# Patient Record
Sex: Female | Born: 1935
Health system: Southern US, Community
[De-identification: ages and names within clinical notes are randomized; demographics above are authoritative.]

## PROBLEM LIST (undated history)

## (undated) DIAGNOSIS — I10 Essential (primary) hypertension: Secondary | ICD-10-CM

## (undated) DIAGNOSIS — E119 Type 2 diabetes mellitus without complications: Secondary | ICD-10-CM

## (undated) DIAGNOSIS — R569 Unspecified convulsions: Secondary | ICD-10-CM

## (undated) DIAGNOSIS — F0390 Unspecified dementia without behavioral disturbance: Secondary | ICD-10-CM

## (undated) DIAGNOSIS — J189 Pneumonia, unspecified organism: Secondary | ICD-10-CM

## (undated) DIAGNOSIS — C50919 Malignant neoplasm of unspecified site of unspecified female breast: Secondary | ICD-10-CM

## (undated) DIAGNOSIS — Z853 Personal history of malignant neoplasm of breast: Secondary | ICD-10-CM

## (undated) DIAGNOSIS — E785 Hyperlipidemia, unspecified: Secondary | ICD-10-CM

## (undated) DIAGNOSIS — Z923 Personal history of irradiation: Secondary | ICD-10-CM

## (undated) HISTORY — DX: Unspecified dementia, unspecified severity, without behavioral disturbance, psychotic disturbance, mood disturbance, and anxiety: F03.90

## (undated) HISTORY — DX: Unspecified convulsions: R56.9

## (undated) HISTORY — DX: Essential (primary) hypertension: I10

## (undated) HISTORY — PX: BREAST BIOPSY: SHX20

## (undated) HISTORY — PX: ABDOMINAL HYSTERECTOMY: SHX81

## (undated) HISTORY — DX: Pneumonia, unspecified organism: J18.9

## (undated) HISTORY — DX: Personal history of malignant neoplasm of breast: Z85.3

## (undated) HISTORY — DX: Hyperlipidemia, unspecified: E78.5

---

## 1979-11-03 HISTORY — PX: TOTAL ABDOMINAL HYSTERECTOMY W/ BILATERAL SALPINGOOPHORECTOMY: SHX83

## 2003-09-28 LAB — HM MAMMOGRAPHY

## 2003-11-03 DIAGNOSIS — C50919 Malignant neoplasm of unspecified site of unspecified female breast: Secondary | ICD-10-CM

## 2003-11-03 HISTORY — DX: Malignant neoplasm of unspecified site of unspecified female breast: C50.919

## 2003-11-03 HISTORY — PX: BREAST EXCISIONAL BIOPSY: SUR124

## 2003-11-03 HISTORY — PX: BREAST LUMPECTOMY: SHX2

## 2003-11-23 ENCOUNTER — Other Ambulatory Visit: Payer: Self-pay

## 2004-08-18 ENCOUNTER — Ambulatory Visit: Payer: Self-pay | Admitting: Oncology

## 2004-10-10 ENCOUNTER — Ambulatory Visit: Payer: Self-pay | Admitting: General Surgery

## 2004-11-24 ENCOUNTER — Ambulatory Visit: Payer: Self-pay | Admitting: General Surgery

## 2004-11-24 HISTORY — PX: OTHER SURGICAL HISTORY: SHX169

## 2004-11-24 LAB — HM COLONOSCOPY

## 2004-12-24 ENCOUNTER — Ambulatory Visit: Payer: Self-pay | Admitting: Oncology

## 2004-12-31 ENCOUNTER — Ambulatory Visit: Payer: Self-pay | Admitting: Oncology

## 2005-02-16 ENCOUNTER — Ambulatory Visit: Payer: Self-pay | Admitting: Oncology

## 2005-04-08 ENCOUNTER — Ambulatory Visit: Payer: Self-pay | Admitting: General Surgery

## 2005-06-24 ENCOUNTER — Ambulatory Visit: Payer: Self-pay | Admitting: Oncology

## 2005-07-03 ENCOUNTER — Ambulatory Visit: Payer: Self-pay | Admitting: Oncology

## 2005-08-19 ENCOUNTER — Ambulatory Visit: Payer: Self-pay | Admitting: Oncology

## 2005-10-09 ENCOUNTER — Ambulatory Visit: Payer: Self-pay | Admitting: General Surgery

## 2005-12-21 ENCOUNTER — Ambulatory Visit: Payer: Self-pay | Admitting: Oncology

## 2005-12-31 ENCOUNTER — Ambulatory Visit: Payer: Self-pay | Admitting: Oncology

## 2006-04-07 ENCOUNTER — Ambulatory Visit: Payer: Self-pay | Admitting: General Surgery

## 2006-06-03 ENCOUNTER — Ambulatory Visit: Payer: Self-pay | Admitting: Family Medicine

## 2006-06-11 ENCOUNTER — Ambulatory Visit: Payer: Self-pay | Admitting: Oncology

## 2006-07-03 ENCOUNTER — Ambulatory Visit: Payer: Self-pay | Admitting: Oncology

## 2006-08-25 ENCOUNTER — Ambulatory Visit: Payer: Self-pay | Admitting: Radiation Oncology

## 2006-10-04 ENCOUNTER — Ambulatory Visit: Payer: Self-pay | Admitting: General Surgery

## 2006-12-07 ENCOUNTER — Ambulatory Visit: Payer: Self-pay | Admitting: Oncology

## 2007-01-01 ENCOUNTER — Ambulatory Visit: Payer: Self-pay | Admitting: Oncology

## 2007-04-11 ENCOUNTER — Ambulatory Visit: Payer: Self-pay | Admitting: General Surgery

## 2007-05-03 ENCOUNTER — Ambulatory Visit: Payer: Self-pay | Admitting: Oncology

## 2007-05-30 ENCOUNTER — Ambulatory Visit: Payer: Self-pay | Admitting: Oncology

## 2007-06-03 ENCOUNTER — Ambulatory Visit: Payer: Self-pay | Admitting: Oncology

## 2007-08-31 ENCOUNTER — Ambulatory Visit: Payer: Self-pay | Admitting: Radiation Oncology

## 2007-09-03 ENCOUNTER — Ambulatory Visit: Payer: Self-pay | Admitting: Radiation Oncology

## 2007-10-10 ENCOUNTER — Ambulatory Visit: Payer: Self-pay | Admitting: General Surgery

## 2007-12-04 ENCOUNTER — Ambulatory Visit: Payer: Self-pay | Admitting: Oncology

## 2007-12-07 ENCOUNTER — Ambulatory Visit: Payer: Self-pay | Admitting: Oncology

## 2008-01-01 ENCOUNTER — Ambulatory Visit: Payer: Self-pay | Admitting: Oncology

## 2008-06-02 ENCOUNTER — Ambulatory Visit: Payer: Self-pay | Admitting: Oncology

## 2008-06-06 ENCOUNTER — Ambulatory Visit: Payer: Self-pay | Admitting: Oncology

## 2008-07-03 ENCOUNTER — Ambulatory Visit: Payer: Self-pay | Admitting: Oncology

## 2008-08-02 ENCOUNTER — Ambulatory Visit: Payer: Self-pay | Admitting: Oncology

## 2008-08-30 ENCOUNTER — Ambulatory Visit: Payer: Self-pay | Admitting: Radiation Oncology

## 2008-10-08 ENCOUNTER — Ambulatory Visit: Payer: Self-pay | Admitting: General Surgery

## 2008-12-03 ENCOUNTER — Ambulatory Visit: Payer: Self-pay | Admitting: Oncology

## 2008-12-06 ENCOUNTER — Ambulatory Visit: Payer: Self-pay | Admitting: Oncology

## 2008-12-31 ENCOUNTER — Ambulatory Visit: Payer: Self-pay | Admitting: Oncology

## 2009-06-02 ENCOUNTER — Ambulatory Visit: Payer: Self-pay | Admitting: Oncology

## 2009-06-06 ENCOUNTER — Ambulatory Visit: Payer: Self-pay | Admitting: Oncology

## 2009-07-03 ENCOUNTER — Ambulatory Visit: Payer: Self-pay | Admitting: Oncology

## 2009-10-14 ENCOUNTER — Ambulatory Visit: Payer: Self-pay | Admitting: General Surgery

## 2009-10-16 ENCOUNTER — Ambulatory Visit: Payer: Self-pay | Admitting: General Surgery

## 2009-12-03 ENCOUNTER — Ambulatory Visit: Payer: Self-pay | Admitting: Oncology

## 2009-12-05 ENCOUNTER — Ambulatory Visit: Payer: Self-pay | Admitting: Oncology

## 2009-12-31 ENCOUNTER — Ambulatory Visit: Payer: Self-pay | Admitting: Oncology

## 2010-04-02 LAB — HM DEXA SCAN: HM DEXA SCAN: NORMAL

## 2010-05-06 ENCOUNTER — Ambulatory Visit: Payer: Self-pay | Admitting: General Surgery

## 2010-06-09 ENCOUNTER — Ambulatory Visit: Payer: Self-pay | Admitting: Oncology

## 2010-07-03 ENCOUNTER — Ambulatory Visit: Payer: Self-pay | Admitting: Oncology

## 2010-10-15 ENCOUNTER — Ambulatory Visit: Payer: Self-pay | Admitting: General Surgery

## 2010-10-28 LAB — EKG

## 2010-11-17 DIAGNOSIS — E559 Vitamin D deficiency, unspecified: Secondary | ICD-10-CM | POA: Insufficient documentation

## 2011-01-02 ENCOUNTER — Ambulatory Visit: Payer: Self-pay | Admitting: Oncology

## 2011-02-01 ENCOUNTER — Ambulatory Visit: Payer: Self-pay | Admitting: Oncology

## 2011-04-22 ENCOUNTER — Ambulatory Visit: Payer: Self-pay | Admitting: General Surgery

## 2011-07-21 ENCOUNTER — Ambulatory Visit: Payer: Self-pay | Admitting: Oncology

## 2011-08-03 ENCOUNTER — Ambulatory Visit: Payer: Self-pay | Admitting: Oncology

## 2011-10-19 ENCOUNTER — Ambulatory Visit: Payer: Self-pay | Admitting: General Surgery

## 2012-01-19 ENCOUNTER — Ambulatory Visit: Payer: Self-pay | Admitting: Oncology

## 2012-01-19 LAB — CBC CANCER CENTER
Basophil #: 0 x10 3/mm (ref 0.0–0.1)
Eosinophil %: 2.5 %
HCT: 31.9 % — ABNORMAL LOW (ref 35.0–47.0)
Lymphocyte #: 1.9 x10 3/mm (ref 1.0–3.6)
Lymphocyte %: 27.7 %
MCH: 31.9 pg (ref 26.0–34.0)
MCHC: 33.6 g/dL (ref 32.0–36.0)
MCV: 95 fL (ref 80–100)
Monocyte #: 0.6 x10 3/mm (ref 0.0–0.7)
Monocyte %: 9.1 %
Neutrophil %: 60.2 %
Platelet: 228 x10 3/mm (ref 150–440)
RBC: 3.36 10*6/uL — ABNORMAL LOW (ref 3.80–5.20)
WBC: 6.8 x10 3/mm (ref 3.6–11.0)

## 2012-01-19 LAB — COMPREHENSIVE METABOLIC PANEL
Albumin: 3.7 g/dL (ref 3.4–5.0)
Anion Gap: 9 (ref 7–16)
Calcium, Total: 9.8 mg/dL (ref 8.5–10.1)
Chloride: 103 mmol/L (ref 98–107)
EGFR (African American): 45 — ABNORMAL LOW
EGFR (Non-African Amer.): 37 — ABNORMAL LOW
Glucose: 172 mg/dL — ABNORMAL HIGH (ref 65–99)
Potassium: 4.1 mmol/L (ref 3.5–5.1)
SGOT(AST): 17 U/L (ref 15–37)

## 2012-02-01 ENCOUNTER — Ambulatory Visit: Payer: Self-pay | Admitting: Oncology

## 2012-04-07 LAB — HM DIABETES EYE EXAM

## 2012-10-20 ENCOUNTER — Ambulatory Visit: Payer: Self-pay | Admitting: Oncology

## 2012-12-31 ENCOUNTER — Ambulatory Visit: Payer: Self-pay | Admitting: Oncology

## 2013-01-18 LAB — COMPREHENSIVE METABOLIC PANEL
Alkaline Phosphatase: 64 U/L (ref 50–136)
Anion Gap: 8 (ref 7–16)
BUN: 22 mg/dL — ABNORMAL HIGH (ref 7–18)
Bilirubin,Total: 0.2 mg/dL (ref 0.2–1.0)
Calcium, Total: 9.5 mg/dL (ref 8.5–10.1)
Chloride: 101 mmol/L (ref 98–107)
Creatinine: 1.5 mg/dL — ABNORMAL HIGH (ref 0.60–1.30)
EGFR (African American): 39 — ABNORMAL LOW
Glucose: 218 mg/dL — ABNORMAL HIGH (ref 65–99)
Osmolality: 284 (ref 275–301)
SGOT(AST): 18 U/L (ref 15–37)
SGPT (ALT): 26 U/L (ref 12–78)
Sodium: 137 mmol/L (ref 136–145)
Total Protein: 7.9 g/dL (ref 6.4–8.2)

## 2013-01-18 LAB — CBC CANCER CENTER
Basophil #: 0.1 x10 3/mm (ref 0.0–0.1)
Eosinophil %: 2.7 %
HCT: 31.7 % — ABNORMAL LOW (ref 35.0–47.0)
HGB: 10.6 g/dL — ABNORMAL LOW (ref 12.0–16.0)
Lymphocyte #: 1.6 x10 3/mm (ref 1.0–3.6)
Neutrophil #: 4 x10 3/mm (ref 1.4–6.5)
Neutrophil %: 61.4 %
Platelet: 204 x10 3/mm (ref 150–440)
RBC: 3.34 10*6/uL — ABNORMAL LOW (ref 3.80–5.20)
RDW: 15 % — ABNORMAL HIGH (ref 11.5–14.5)
WBC: 6.5 x10 3/mm (ref 3.6–11.0)

## 2013-01-31 ENCOUNTER — Ambulatory Visit: Payer: Self-pay | Admitting: Oncology

## 2013-04-07 LAB — HM DIABETES EYE EXAM

## 2013-04-11 LAB — HEMOGLOBIN A1C, FINGERSTICK: HEMOGLOBIN A1C: 7.3

## 2013-04-12 LAB — LIPID PANEL
ALT: 20
BUN: 21
CHOLESTEROL, TOTAL: 128
Creat: 1.32
GLUCOSE: 109
HDL: 40 mg/dL (ref 35–70)
LDL CALC: 66 mg/dL
Potassium: 4.7 mmol/L
Sodium: 142
Total CK: 113 U/L (ref ?–170.0)
Triglycerides: 111
Vitamin D 1, 25 (OH)2 Total: 58.8

## 2013-06-22 ENCOUNTER — Emergency Department: Payer: Self-pay | Admitting: Emergency Medicine

## 2013-09-08 LAB — LIPID PANEL
BUN: 20
CREATININE: 1.35
Cholesterol, Total: 198
GLUCOSE: 139
HDL: 42 mg/dL (ref 35–70)
Hemoglobin A1C: 7.5
LDL CALC: 130 mg/dL
Potassium: 4.3 mmol/L
Sodium: 139
Triglycerides: 129

## 2013-10-23 ENCOUNTER — Ambulatory Visit: Payer: Self-pay | Admitting: Oncology

## 2013-12-13 LAB — HEMOGLOBIN A1C, FINGERSTICK
Hemoglobin A1C: 7
Microalb, Ur: 20

## 2014-01-24 ENCOUNTER — Ambulatory Visit: Payer: Self-pay | Admitting: Oncology

## 2014-01-25 LAB — CBC CANCER CENTER
Basophil #: 0.1 x10 3/mm (ref 0.0–0.1)
Basophil %: 1 %
EOS ABS: 0.2 x10 3/mm (ref 0.0–0.7)
EOS PCT: 2.6 %
HCT: 35.2 % (ref 35.0–47.0)
HGB: 11.3 g/dL — ABNORMAL LOW (ref 12.0–16.0)
LYMPHS PCT: 20.3 %
Lymphocyte #: 1.5 x10 3/mm (ref 1.0–3.6)
MCH: 31.6 pg (ref 26.0–34.0)
MCHC: 32.3 g/dL (ref 32.0–36.0)
MCV: 98 fL (ref 80–100)
MONOS PCT: 10 %
Monocyte #: 0.8 x10 3/mm (ref 0.2–0.9)
Neutrophil #: 5 x10 3/mm (ref 1.4–6.5)
Neutrophil %: 66.1 %
Platelet: 202 x10 3/mm (ref 150–440)
RBC: 3.59 10*6/uL — ABNORMAL LOW (ref 3.80–5.20)
RDW: 14.1 % (ref 11.5–14.5)
WBC: 7.6 x10 3/mm (ref 3.6–11.0)

## 2014-01-25 LAB — COMPREHENSIVE METABOLIC PANEL
Albumin: 3.6 g/dL (ref 3.4–5.0)
Alkaline Phosphatase: 60 U/L
Anion Gap: 12 (ref 7–16)
BILIRUBIN TOTAL: 0.3 mg/dL (ref 0.2–1.0)
BUN: 26 mg/dL — AB (ref 7–18)
CHLORIDE: 100 mmol/L (ref 98–107)
CO2: 24 mmol/L (ref 21–32)
Calcium, Total: 10 mg/dL (ref 8.5–10.1)
Creatinine: 1.64 mg/dL — ABNORMAL HIGH (ref 0.60–1.30)
EGFR (African American): 34 — ABNORMAL LOW
GFR CALC NON AF AMER: 30 — AB
Glucose: 229 mg/dL — ABNORMAL HIGH (ref 65–99)
Osmolality: 284 (ref 275–301)
Potassium: 3.9 mmol/L (ref 3.5–5.1)
SGOT(AST): 22 U/L (ref 15–37)
SGPT (ALT): 26 U/L (ref 12–78)
SODIUM: 136 mmol/L (ref 136–145)
Total Protein: 8.3 g/dL — ABNORMAL HIGH (ref 6.4–8.2)

## 2014-01-31 ENCOUNTER — Ambulatory Visit: Payer: Self-pay | Admitting: Oncology

## 2014-04-11 LAB — HEMOGLOBIN A1C, FINGERSTICK: Hemoglobin A1C: 7.2

## 2014-09-14 LAB — LIPID PANEL
BUN: 20
CHOLESTEROL, TOTAL: 187
Creat: 1.33
GLUCOSE: 111
HDL: 42 mg/dL (ref 35–70)
HEMOGLOBIN A1C: 8.1
LDL Cholesterol: 119 mg/dL
Potassium: 4.4 mmol/L
Sodium: 139
Triglycerides: 132
Vitamin D 1, 25 (OH)2 Total: 61.1

## 2014-10-24 ENCOUNTER — Ambulatory Visit: Payer: Self-pay | Admitting: Family Medicine

## 2015-01-10 DIAGNOSIS — E119 Type 2 diabetes mellitus without complications: Secondary | ICD-10-CM | POA: Diagnosis not present

## 2015-01-10 DIAGNOSIS — E785 Hyperlipidemia, unspecified: Secondary | ICD-10-CM | POA: Diagnosis not present

## 2015-01-10 DIAGNOSIS — I10 Essential (primary) hypertension: Secondary | ICD-10-CM | POA: Diagnosis not present

## 2015-01-10 DIAGNOSIS — F0390 Unspecified dementia without behavioral disturbance: Secondary | ICD-10-CM | POA: Diagnosis not present

## 2015-01-10 LAB — HEMOGLOBIN A1C, FINGERSTICK
Hemoglobin A1C: 6.5
Microalb, Ur: 20

## 2015-02-15 DIAGNOSIS — F0390 Unspecified dementia without behavioral disturbance: Secondary | ICD-10-CM | POA: Diagnosis not present

## 2015-02-15 DIAGNOSIS — I10 Essential (primary) hypertension: Secondary | ICD-10-CM | POA: Diagnosis not present

## 2015-02-15 DIAGNOSIS — R630 Anorexia: Secondary | ICD-10-CM | POA: Diagnosis not present

## 2015-02-15 DIAGNOSIS — E785 Hyperlipidemia, unspecified: Secondary | ICD-10-CM | POA: Diagnosis not present

## 2015-03-14 DIAGNOSIS — E1165 Type 2 diabetes mellitus with hyperglycemia: Secondary | ICD-10-CM | POA: Diagnosis not present

## 2015-03-14 DIAGNOSIS — F0390 Unspecified dementia without behavioral disturbance: Secondary | ICD-10-CM | POA: Diagnosis not present

## 2015-03-14 DIAGNOSIS — R634 Abnormal weight loss: Secondary | ICD-10-CM | POA: Diagnosis not present

## 2015-03-14 DIAGNOSIS — E785 Hyperlipidemia, unspecified: Secondary | ICD-10-CM | POA: Diagnosis not present

## 2015-03-14 DIAGNOSIS — R635 Abnormal weight gain: Secondary | ICD-10-CM | POA: Diagnosis not present

## 2015-03-14 LAB — CBC AND DIFFERENTIAL
HEMATOCRIT: 33 % — AB (ref 36–46)
Hemoglobin: 10.8 g/dL — AB (ref 12.0–16.0)
Platelets: 242 10*3/uL (ref 150–399)
WBC: 6.9 10^3/mL

## 2015-03-14 LAB — HEPATIC FUNCTION PANEL
ALT: 15 U/L (ref 7–35)
AST: 15 U/L (ref 13–35)

## 2015-03-14 LAB — BASIC METABOLIC PANEL
BUN: 23 mg/dL — AB (ref 4–21)
Creatinine: 1.2 mg/dL — AB (ref 0.5–1.1)
Glucose: 155 mg/dL
Potassium: 4.3 mmol/L (ref 3.4–5.3)
SODIUM: 137 mmol/L (ref 137–147)

## 2015-03-14 LAB — TSH: TSH: 1.14 u[IU]/mL (ref 0.41–5.90)

## 2015-03-15 ENCOUNTER — Other Ambulatory Visit: Payer: Self-pay | Admitting: Family Medicine

## 2015-03-15 DIAGNOSIS — R634 Abnormal weight loss: Secondary | ICD-10-CM

## 2015-03-20 ENCOUNTER — Ambulatory Visit
Admission: RE | Admit: 2015-03-20 | Discharge: 2015-03-20 | Disposition: A | Discharge status: Home / Self Care | Payer: Commercial Managed Care - HMO | Source: Ambulatory Visit | Attending: Family Medicine | Admitting: Family Medicine

## 2015-03-20 DIAGNOSIS — R634 Abnormal weight loss: Secondary | ICD-10-CM | POA: Diagnosis not present

## 2015-03-25 ENCOUNTER — Encounter: Payer: Self-pay | Admitting: *Deleted

## 2015-03-25 DIAGNOSIS — M79604 Pain in right leg: Secondary | ICD-10-CM | POA: Insufficient documentation

## 2015-03-25 DIAGNOSIS — R5381 Other malaise: Secondary | ICD-10-CM | POA: Insufficient documentation

## 2015-03-25 DIAGNOSIS — S60212A Contusion of left wrist, initial encounter: Secondary | ICD-10-CM | POA: Insufficient documentation

## 2015-03-25 DIAGNOSIS — R630 Anorexia: Secondary | ICD-10-CM | POA: Insufficient documentation

## 2015-03-25 DIAGNOSIS — D649 Anemia, unspecified: Secondary | ICD-10-CM | POA: Insufficient documentation

## 2015-03-25 DIAGNOSIS — E785 Hyperlipidemia, unspecified: Secondary | ICD-10-CM | POA: Insufficient documentation

## 2015-03-25 DIAGNOSIS — R634 Abnormal weight loss: Secondary | ICD-10-CM | POA: Insufficient documentation

## 2015-03-25 DIAGNOSIS — E1121 Type 2 diabetes mellitus with diabetic nephropathy: Secondary | ICD-10-CM | POA: Insufficient documentation

## 2015-03-25 DIAGNOSIS — Z853 Personal history of malignant neoplasm of breast: Secondary | ICD-10-CM | POA: Insufficient documentation

## 2015-03-25 DIAGNOSIS — S2002XA Contusion of left breast, initial encounter: Secondary | ICD-10-CM | POA: Insufficient documentation

## 2015-04-17 ENCOUNTER — Encounter: Payer: Self-pay | Admitting: Family Medicine

## 2015-04-17 ENCOUNTER — Ambulatory Visit (INDEPENDENT_AMBULATORY_CARE_PROVIDER_SITE_OTHER): Payer: Commercial Managed Care - HMO | Admitting: Family Medicine

## 2015-04-17 VITALS — BP 114/62 | HR 65 | Temp 98.1°F | Resp 16 | Wt 163.0 lb

## 2015-04-17 DIAGNOSIS — E119 Type 2 diabetes mellitus without complications: Secondary | ICD-10-CM

## 2015-04-17 LAB — POCT GLYCOSYLATED HEMOGLOBIN (HGB A1C)
Est. average glucose Bld gHb Est-mCnc: 137
HEMOGLOBIN A1C: 6.4

## 2015-04-17 NOTE — Progress Notes (Signed)
Patient: Briana Austin Female    DOB: November 13, 1933   81 y.o.   MRN: [id number] Visit Date: 04/17/2015  Today's Provider: Lelon Huh, MD   Chief Complaint  Patient presents with  . Diabetes    follow up   . Hypertension    follow up   Subjective:    HPI  Hypertension, follow-up:  BP Readings from Last 3 Encounters:  04/17/15 114/62  03/14/15 108/60    She was last seen for hypertension 3 months ago.  BP at that visit was 108/60. Management changes since that visit include  none. She reports good compliance with treatment. She is not having side effects.  She is not exercising. She is not adherent to low salt diet.   Outside blood pressures are  Not being checked. She is experiencing none.  Patient denies chest pain, chest pressure/discomfort, claudication, dyspnea, exertional chest pressure/discomfort, fatigue, irregular heart beat, lower extremity edema, near-syncope, orthopnea, palpitations, paroxysmal nocturnal dyspnea, syncope and tachypnea.   Cardiovascular risk factors include advanced age (older than 56 for men, 64 for women).  Use of agents associated with hypertension: none.    Wt Readings from Last 3 Encounters:  04/17/15 163 lb (73.936 kg)  03/14/15 162 lb (73.483 kg)    Weight trend: stable BP Readings from Last 3 Encounters:  04/17/15 114/62  03/14/15 108/60   Current diet: in general, a "healthy" diet    ------------------------------------------------------------------------   Diabetes Mellitus Type II, Follow-up:   Last HgbA1C: 01/10/2015  6.5 Last seen for diabetes 1 months ago.  Management changes included decreasing Metformin to 1 pill daily due to poor appetite. . She reports good compliance with treatment. She is not having side effects.  Current symptoms include none and have been stable. Home blood sugar records: not being checked  Episodes of hypoglycemia? no   Current Insulin Regimen: none Most Recent Eye Exam: < 1  year Weight trend: stable Prior visit with dietician: no Current diet: in general, a "healthy" diet   Current exercise: none  Pertinent Labs:    Component Value Date/Time   CHOL 187 09/14/2014   TRIG 132 09/14/2014   CREATININE 1.2* 03/14/2015   CREATININE 1.33 09/14/2014   CREATININE 1.64* 01/25/2014 1040    Wt Readings from Last 3 Encounters:  04/17/15 163 lb (73.936 kg)  03/14/15 162 lb (73.483 kg)    We had increased her metformin to 2 daily when her A1c was 8.3 in November, but she starting eating poorly and loosing weight after dose increase. Since cutting back to one a day last month, the family reports she has been eating and feeling much better, and is back to her baseline.  ------------------------------------------------------------------------     Previous Medications   ASPIRIN 81 MG TABLET    Take 1 tablet by mouth daily.   CALCIUM CARBONATE (OS-CAL) 600 MG TABS TABLET    Take 1 tablet by mouth daily.   DONEPEZIL (ARICEPT) 5 MG TABLET    Take 1 tablet by mouth daily.   GLIPIZIDE (GLUCOTROL XL) 5 MG 24 HR TABLET    Take 1 tablet by mouth daily.   LISINOPRIL (PRINIVIL,ZESTRIL) 40 MG TABLET    Take 1 tablet by mouth daily.   MEMANTINE (NAMENDA XR) 28 MG CP24 24 HR CAPSULE    Take 1 capsule by mouth daily.   METFORMIN (GLUCOPHAGE-XR) 500 MG 24 HR TABLET    Take 1 tablet by mouth daily.   METOPROLOL-HYDROCHLOROTHIAZIDE (LOPRESSOR HCT) 100-50 MG PER TABLET  Take 1 tablet by mouth daily.   MIRTAZAPINE (REMERON) 7.5 MG TABLET    Take 1 tablet by mouth at bedtime.   POTASSIUM CHLORIDE (K-DUR,KLOR-CON) 10 MEQ TABLET    Take 1 tablet by mouth daily.   PRAVASTATIN (PRAVACHOL) 40 MG TABLET    Take 1 tablet by mouth daily.   VITAMIN D, ERGOCALCIFEROL, (DRISDOL) 50000 UNITS CAPS CAPSULE    Take 1 capsule by mouth once a week.    Review of Systems  Constitutional: Positive for appetite change. Negative for fever, chills, fatigue and unexpected weight change.  HENT: Negative  for congestion.   Respiratory: Negative for chest tightness.   Cardiovascular: Negative for chest pain and leg swelling.  Endocrine: Negative for cold intolerance.    History  Substance Use Topics  . Smoking status: Former Smoker -- 0.50 packs/day for 8 years    Types: Cigarettes  . Smokeless tobacco: Not on file  . Alcohol Use: No   Objective:   BP 114/62 mmHg  Pulse 65  Temp(Src) 98.1 F (36.7 C) (Oral)  Resp 16  Wt 163 lb (73.936 kg)  SpO2 98%  Physical Exam   General Appearance:    Alert, cooperative, no distress.   Eyes:    PERRL, conjunctiva/corneas clear, EOM's intact       Lungs:     Clear to auscultation bilaterally, respirations unlabored  Heart:    Regular rate and rhythm  Neurologic:   Awake, alert, oriented only to person. No apparent focal   neurological           defect.        Results for orders placed or performed in visit on 04/17/15  POCT HgB A1C  Result Value Ref Range   Hemoglobin A1C 6.4    Est. average glucose Bld gHb Est-mCnc 137        Assessment & Plan:      1. Type 2 diabetes mellitus without complication Well controlled, feeling better with improved appetite since cutting back on metformin. Recheck in the fall. May need different medication if A1c gets back up above 8.  - POCT HgB A1C   Follow up: Return in about 4 months (around 08/17/2015).

## 2015-05-03 ENCOUNTER — Other Ambulatory Visit: Payer: Self-pay

## 2015-05-03 DIAGNOSIS — E785 Hyperlipidemia, unspecified: Secondary | ICD-10-CM

## 2015-05-03 DIAGNOSIS — I10 Essential (primary) hypertension: Secondary | ICD-10-CM

## 2015-05-03 MED ORDER — PRAVASTATIN SODIUM 40 MG PO TABS: 40.0000 mg | ORAL_TABLET | Freq: Every day | ORAL | Status: DC

## 2015-05-03 MED ORDER — LISINOPRIL 40 MG PO TABS: 40.0000 mg | ORAL_TABLET | Freq: Every day | ORAL | Status: DC

## 2015-05-03 NOTE — Telephone Encounter (Signed)
Pharmacy: [facility] [place]  Request refill. Last office visit was 04/17/2015

## 2015-06-03 ENCOUNTER — Other Ambulatory Visit: Payer: Self-pay | Admitting: Family Medicine

## 2015-06-03 NOTE — Telephone Encounter (Signed)
Next OV is on 06/18/2015

## 2015-06-04 DIAGNOSIS — E119 Type 2 diabetes mellitus without complications: Secondary | ICD-10-CM | POA: Diagnosis not present

## 2015-06-04 LAB — HM DIABETES EYE EXAM

## 2015-06-05 ENCOUNTER — Encounter: Payer: Self-pay | Admitting: Family Medicine

## 2015-06-07 ENCOUNTER — Encounter: Payer: Self-pay | Admitting: Family Medicine

## 2015-06-07 ENCOUNTER — Ambulatory Visit
Admission: RE | Admit: 2015-06-07 | Discharge: 2015-06-07 | Disposition: A | Discharge status: Home / Self Care | Payer: Commercial Managed Care - HMO | Source: Ambulatory Visit | Attending: Family Medicine | Admitting: Family Medicine

## 2015-06-07 ENCOUNTER — Ambulatory Visit (INDEPENDENT_AMBULATORY_CARE_PROVIDER_SITE_OTHER): Payer: Commercial Managed Care - HMO | Admitting: Family Medicine

## 2015-06-07 VITALS — BP 112/58 | HR 92 | Temp 101.0°F | Resp 14 | Ht 68.0 in | Wt 162.0 lb

## 2015-06-07 DIAGNOSIS — I10 Essential (primary) hypertension: Secondary | ICD-10-CM | POA: Diagnosis not present

## 2015-06-07 DIAGNOSIS — G510 Bell's palsy: Secondary | ICD-10-CM | POA: Diagnosis not present

## 2015-06-07 DIAGNOSIS — Z90710 Acquired absence of both cervix and uterus: Secondary | ICD-10-CM | POA: Diagnosis not present

## 2015-06-07 DIAGNOSIS — Z87891 Personal history of nicotine dependence: Secondary | ICD-10-CM | POA: Diagnosis not present

## 2015-06-07 DIAGNOSIS — Z820 Family history of epilepsy and other diseases of the nervous system: Secondary | ICD-10-CM | POA: Diagnosis not present

## 2015-06-07 DIAGNOSIS — K5720 Diverticulitis of large intestine with perforation and abscess without bleeding: Secondary | ICD-10-CM | POA: Diagnosis not present

## 2015-06-07 DIAGNOSIS — G610 Guillain-Barre syndrome: Secondary | ICD-10-CM | POA: Diagnosis not present

## 2015-06-07 DIAGNOSIS — E1165 Type 2 diabetes mellitus with hyperglycemia: Secondary | ICD-10-CM | POA: Diagnosis not present

## 2015-06-07 DIAGNOSIS — K6389 Other specified diseases of intestine: Secondary | ICD-10-CM | POA: Diagnosis not present

## 2015-06-07 DIAGNOSIS — E119 Type 2 diabetes mellitus without complications: Secondary | ICD-10-CM | POA: Diagnosis not present

## 2015-06-07 DIAGNOSIS — Z853 Personal history of malignant neoplasm of breast: Secondary | ICD-10-CM | POA: Diagnosis not present

## 2015-06-07 DIAGNOSIS — E785 Hyperlipidemia, unspecified: Secondary | ICD-10-CM | POA: Diagnosis not present

## 2015-06-07 DIAGNOSIS — I82401 Acute embolism and thrombosis of unspecified deep veins of right lower extremity: Secondary | ICD-10-CM | POA: Diagnosis not present

## 2015-06-07 DIAGNOSIS — R05 Cough: Secondary | ICD-10-CM | POA: Insufficient documentation

## 2015-06-07 DIAGNOSIS — R5383 Other fatigue: Secondary | ICD-10-CM

## 2015-06-07 DIAGNOSIS — R51 Headache: Secondary | ICD-10-CM | POA: Diagnosis present

## 2015-06-07 DIAGNOSIS — Z7982 Long term (current) use of aspirin: Secondary | ICD-10-CM | POA: Diagnosis not present

## 2015-06-07 DIAGNOSIS — Z9889 Other specified postprocedural states: Secondary | ICD-10-CM | POA: Diagnosis not present

## 2015-06-07 DIAGNOSIS — K5732 Diverticulitis of large intestine without perforation or abscess without bleeding: Secondary | ICD-10-CM | POA: Diagnosis not present

## 2015-06-07 DIAGNOSIS — R509 Fever, unspecified: Secondary | ICD-10-CM | POA: Diagnosis not present

## 2015-06-07 DIAGNOSIS — R634 Abnormal weight loss: Secondary | ICD-10-CM | POA: Diagnosis not present

## 2015-06-07 DIAGNOSIS — Z79899 Other long term (current) drug therapy: Secondary | ICD-10-CM | POA: Diagnosis not present

## 2015-06-07 DIAGNOSIS — Z823 Family history of stroke: Secondary | ICD-10-CM | POA: Diagnosis not present

## 2015-06-07 DIAGNOSIS — J189 Pneumonia, unspecified organism: Secondary | ICD-10-CM | POA: Diagnosis not present

## 2015-06-07 DIAGNOSIS — F0390 Unspecified dementia without behavioral disturbance: Secondary | ICD-10-CM | POA: Diagnosis not present

## 2015-06-07 LAB — POCT URINALYSIS DIPSTICK
Bilirubin, UA: NEGATIVE
Glucose, UA: NEGATIVE
Ketones, UA: NEGATIVE
Leukocytes, UA: NEGATIVE
Nitrite, UA: NEGATIVE
Protein, UA: NEGATIVE
RBC UA: NEGATIVE
Spec Grav, UA: 1.025
UROBILINOGEN UA: 2
pH, UA: 5

## 2015-06-07 MED ORDER — AMOXICILLIN 500 MG PO CAPS: 1000.0000 mg | ORAL_CAPSULE | Freq: Two times a day (BID) | ORAL | Status: DC

## 2015-06-07 NOTE — Progress Notes (Signed)
Patient ID: Briana Austin, female   DOB: Oct 25, 1934, 81 y.o.   MRN: [id number]       Patient: Briana Austin Female    DOB: 08-07-1934   81 y.o.   MRN: [id number] Visit Date: 06/07/2015  Today's Provider: Lelon Huh, MD   Chief Complaint  Patient presents with  . Anorexia   Subjective:    HPI  Patient is here with a complaint of not eating, she is accompanied today by her daughter and her husband. They state that patient has not been eating well since Sunday July 31st. She did had some Ensure 2 days ago, she has not been drinking well, they are not sure if she is urinating or having a bowel movement but she has been going to the bathroom.     Allergies  Allergen Reactions  . Tramadol Hcl Nausea And Vomiting   Previous Medications   ASPIRIN 81 MG TABLET    Take 1 tablet by mouth daily.   CALCIUM CARBONATE (OS-CAL) 600 MG TABS TABLET    Take 1 tablet by mouth daily.   DONEPEZIL (ARICEPT) 5 MG TABLET    Take 1 tablet by mouth daily.   GLIPIZIDE (GLUCOTROL XL) 5 MG 24 HR TABLET    Take 1 tablet by mouth daily.   LISINOPRIL (PRINIVIL,ZESTRIL) 40 MG TABLET    Take 1 tablet (40 mg total) by mouth daily.   MEMANTINE (NAMENDA XR) 28 MG CP24 24 HR CAPSULE    Take 1 capsule by mouth daily.   METFORMIN (GLUCOPHAGE-XR) 500 MG 24 HR TABLET    Take 1 tablet by mouth daily.   METOPROLOL-HYDROCHLOROTHIAZIDE (LOPRESSOR HCT) 100-50 MG PER TABLET    TAKE ONE TABLET BY MOUTH ONCE DAILY   MIRTAZAPINE (REMERON) 7.5 MG TABLET    Take 1 tablet by mouth at bedtime.   POTASSIUM CHLORIDE (K-DUR,KLOR-CON) 10 MEQ TABLET    Take 1 tablet by mouth daily.   PRAVASTATIN (PRAVACHOL) 40 MG TABLET    Take 1 tablet (40 mg total) by mouth daily.   VITAMIN D, ERGOCALCIFEROL, (DRISDOL) 50000 UNITS CAPS CAPSULE    Take 1 capsule by mouth once a week.    Review of Systems  Constitutional: Positive for fatigue. Negative for fever and chills.  Respiratory: Positive for cough. Negative for choking, chest tightness,  shortness of breath and wheezing.   Cardiovascular: Negative for chest pain, palpitations and leg swelling.  Gastrointestinal: Negative for nausea, vomiting, abdominal pain, diarrhea and rectal pain.  Genitourinary:       Per patient no trouble   Musculoskeletal: Positive for gait problem. Negative for myalgias, back pain, joint swelling, arthralgias, neck pain and neck stiffness.  Neurological: Positive for headaches.    History  Substance Use Topics  . Smoking status: Former Smoker -- 0.50 packs/day for 8 years    Types: Cigarettes  . Smokeless tobacco: Never Used  . Alcohol Use: No   Objective:   BP 112/58 mmHg  Pulse 92  Temp(Src) 101 F (38.3 C)  Resp 14  Ht 5\' 8"  (1.727 m)  Wt 162 lb (73.483 kg)  BMI 24.64 kg/m2  SpO2 95%  Physical Exam  General Appearance:    Alert, cooperative, no distress  Eyes:    PERRL, conjunctiva/corneas clear, EOM's intact       HEENT : Mild drainage posterior pharynx. Mild nasal congestion  Lungs:     Clear to auscultation bilaterally, respirations unlabored  Heart:    Regular rate and rhythm  Neurologic:  Awake, alert, oriented x 3. No apparent focal neurological           defect.   Derm   No rashes  Abd   No masses or tenderness        Assessment & Plan:     1. Other specified fever  - POCT Urinalysis Dipstick - CBC - DG Chest 2 View; Future  Chest XR is unremarkable. She does have mild cough and suspect URI or sinus infection as source of fever. Start on amoxicillin and advised to call or go to ER if fever is not resolved within 24 hours of if any new or worsening symptoms.   2. Other fatigue  - Comprehensive metabolic panel       Lelon Huh, MD  [facility]

## 2015-06-07 NOTE — Patient Instructions (Addendum)
Tylenol 325mg , take 2 every six hours for fever or headache

## 2015-06-08 ENCOUNTER — Inpatient Hospital Stay
Admission: EM | Admit: 2015-06-08 | Discharge: 2015-06-14 | DRG: 194 | Disposition: A | Discharge status: Home / Self Care | Payer: Commercial Managed Care - HMO | Attending: Internal Medicine | Admitting: Internal Medicine

## 2015-06-08 ENCOUNTER — Encounter: Payer: Self-pay | Admitting: Emergency Medicine

## 2015-06-08 ENCOUNTER — Observation Stay: Payer: Commercial Managed Care - HMO

## 2015-06-08 DIAGNOSIS — Z820 Family history of epilepsy and other diseases of the nervous system: Secondary | ICD-10-CM

## 2015-06-08 DIAGNOSIS — Z823 Family history of stroke: Secondary | ICD-10-CM

## 2015-06-08 DIAGNOSIS — Z7982 Long term (current) use of aspirin: Secondary | ICD-10-CM

## 2015-06-08 DIAGNOSIS — K5732 Diverticulitis of large intestine without perforation or abscess without bleeding: Secondary | ICD-10-CM | POA: Diagnosis present

## 2015-06-08 DIAGNOSIS — J189 Pneumonia, unspecified organism: Principal | ICD-10-CM | POA: Diagnosis present

## 2015-06-08 DIAGNOSIS — R5381 Other malaise: Secondary | ICD-10-CM | POA: Diagnosis present

## 2015-06-08 DIAGNOSIS — I10 Essential (primary) hypertension: Secondary | ICD-10-CM | POA: Diagnosis not present

## 2015-06-08 DIAGNOSIS — R51 Headache: Secondary | ICD-10-CM | POA: Diagnosis present

## 2015-06-08 DIAGNOSIS — R509 Fever, unspecified: Secondary | ICD-10-CM | POA: Diagnosis present

## 2015-06-08 DIAGNOSIS — Z853 Personal history of malignant neoplasm of breast: Secondary | ICD-10-CM

## 2015-06-08 DIAGNOSIS — E785 Hyperlipidemia, unspecified: Secondary | ICD-10-CM | POA: Diagnosis present

## 2015-06-08 DIAGNOSIS — E1165 Type 2 diabetes mellitus with hyperglycemia: Secondary | ICD-10-CM | POA: Diagnosis present

## 2015-06-08 DIAGNOSIS — R5383 Other fatigue: Secondary | ICD-10-CM | POA: Diagnosis not present

## 2015-06-08 DIAGNOSIS — Z90710 Acquired absence of both cervix and uterus: Secondary | ICD-10-CM

## 2015-06-08 DIAGNOSIS — F0390 Unspecified dementia without behavioral disturbance: Secondary | ICD-10-CM | POA: Diagnosis present

## 2015-06-08 DIAGNOSIS — K6389 Other specified diseases of intestine: Secondary | ICD-10-CM | POA: Diagnosis not present

## 2015-06-08 DIAGNOSIS — Z87891 Personal history of nicotine dependence: Secondary | ICD-10-CM

## 2015-06-08 DIAGNOSIS — Z9889 Other specified postprocedural states: Secondary | ICD-10-CM

## 2015-06-08 DIAGNOSIS — E119 Type 2 diabetes mellitus without complications: Secondary | ICD-10-CM | POA: Diagnosis present

## 2015-06-08 DIAGNOSIS — Z79899 Other long term (current) drug therapy: Secondary | ICD-10-CM

## 2015-06-08 DIAGNOSIS — R634 Abnormal weight loss: Secondary | ICD-10-CM | POA: Diagnosis not present

## 2015-06-08 HISTORY — DX: Type 2 diabetes mellitus without complications: E11.9

## 2015-06-08 LAB — COMPREHENSIVE METABOLIC PANEL
A/G RATIO: 1 — AB (ref 1.1–2.5)
ALBUMIN: 3.9 g/dL (ref 3.5–4.8)
ALT: 16 IU/L (ref 0–32)
ALT: 21 U/L (ref 14–54)
AST: 20 IU/L (ref 0–40)
AST: 32 U/L (ref 15–41)
Albumin: 3.6 g/dL (ref 3.5–5.0)
Alkaline Phosphatase: 69 IU/L (ref 39–117)
Alkaline Phosphatase: 58 U/L (ref 38–126)
Anion gap: 11 (ref 5–15)
BUN/Creatinine Ratio: 20 (ref 11–26)
BUN: 30 mg/dL — AB (ref 8–27)
BUN: 36 mg/dL — ABNORMAL HIGH (ref 6–20)
Bilirubin Total: 0.5 mg/dL (ref 0.0–1.2)
CHLORIDE: 93 mmol/L — AB (ref 97–108)
CHLORIDE: 96 mmol/L — AB (ref 101–111)
CO2: 22 mmol/L (ref 18–29)
CO2: 26 mmol/L (ref 22–32)
Calcium: 10.3 mg/dL (ref 8.7–10.3)
Calcium: 10 mg/dL (ref 8.9–10.3)
Creatinine, Ser: 1.51 mg/dL — ABNORMAL HIGH (ref 0.57–1.00)
Creatinine, Ser: 1.52 mg/dL — ABNORMAL HIGH (ref 0.44–1.00)
GFR calc Af Amer: 38 mL/min/{1.73_m2} — ABNORMAL LOW (ref >59)
GFR calc Af Amer: 36 mL/min — ABNORMAL LOW (ref >60)
GFR calc non Af Amer: 33 mL/min/{1.73_m2} — ABNORMAL LOW (ref >59)
GFR calc non Af Amer: 31 mL/min — ABNORMAL LOW (ref >60)
Globulin, Total: 3.9 g/dL (ref 1.5–4.5)
Glucose, Bld: 231 mg/dL — ABNORMAL HIGH (ref 65–99)
Glucose: 154 mg/dL — ABNORMAL HIGH (ref 65–99)
POTASSIUM: 4.5 mmol/L (ref 3.5–5.1)
Potassium: 4.4 mmol/L (ref 3.5–5.2)
SODIUM: 134 mmol/L (ref 134–144)
SODIUM: 133 mmol/L — AB (ref 135–145)
Total Bilirubin: 0.5 mg/dL (ref 0.3–1.2)
Total Protein: 7.8 g/dL (ref 6.0–8.5)
Total Protein: 8 g/dL (ref 6.5–8.1)

## 2015-06-08 LAB — CBC
Hematocrit: 32.3 % — ABNORMAL LOW (ref 34.0–46.6)
Hemoglobin: 10.7 g/dL — ABNORMAL LOW (ref 11.1–15.9)
MCH: 30.7 pg (ref 26.6–33.0)
MCHC: 33.1 g/dL (ref 31.5–35.7)
MCV: 93 fL (ref 79–97)
Platelets: 224 10*3/uL (ref 150–379)
RBC: 3.49 x10E6/uL — ABNORMAL LOW (ref 3.77–5.28)
RDW: 14.2 % (ref 12.3–15.4)
WBC: 9.3 10*3/uL (ref 3.4–10.8)

## 2015-06-08 LAB — CBC WITH DIFFERENTIAL/PLATELET
BASOS ABS: 0 10*3/uL (ref 0–0.1)
BASOS PCT: 1 %
Eosinophils Absolute: 0 10*3/uL (ref 0–0.7)
Eosinophils Relative: 1 %
HEMATOCRIT: 30.4 % — AB (ref 35.0–47.0)
Hemoglobin: 10.3 g/dL — ABNORMAL LOW (ref 12.0–16.0)
LYMPHS ABS: 0.7 10*3/uL — AB (ref 1.0–3.6)
LYMPHS PCT: 8 %
MCH: 31.8 pg (ref 26.0–34.0)
MCHC: 33.8 g/dL (ref 32.0–36.0)
MCV: 94 fL (ref 80.0–100.0)
Monocytes Absolute: 0.8 10*3/uL (ref 0.2–0.9)
Monocytes Relative: 8 %
NEUTROS PCT: 82 %
Neutro Abs: 7.8 10*3/uL — ABNORMAL HIGH (ref 1.4–6.5)
Platelets: 194 10*3/uL (ref 150–440)
RBC: 3.24 MIL/uL — AB (ref 3.80–5.20)
RDW: 13.6 % (ref 11.5–14.5)
WBC: 9.4 10*3/uL (ref 3.6–11.0)

## 2015-06-08 LAB — URINALYSIS COMPLETE WITH MICROSCOPIC (ARMC ONLY)
Bilirubin Urine: NEGATIVE
GLUCOSE, UA: NEGATIVE mg/dL
HGB URINE DIPSTICK: NEGATIVE
Ketones, ur: NEGATIVE mg/dL
LEUKOCYTES UA: NEGATIVE
NITRITE: NEGATIVE
Protein, ur: 30 mg/dL — AB
Specific Gravity, Urine: 1.02 (ref 1.005–1.030)
pH: 5 (ref 5.0–8.0)

## 2015-06-08 LAB — SEDIMENTATION RATE: SED RATE: 100 mm/h — AB (ref 0–30)

## 2015-06-08 LAB — POCT RAPID STREP A: STREPTOCOCCUS, GROUP A SCREEN (DIRECT): NEGATIVE

## 2015-06-08 LAB — GLUCOSE, CAPILLARY
GLUCOSE-CAPILLARY: 135 mg/dL — AB (ref 65–99)
Glucose-Capillary: 135 mg/dL — ABNORMAL HIGH (ref 65–99)

## 2015-06-08 LAB — LACTIC ACID, PLASMA
LACTIC ACID, VENOUS: 1 mmol/L (ref 0.5–2.0)
Lactic Acid, Venous: 1.5 mmol/L (ref 0.5–2.0)

## 2015-06-08 LAB — TSH: TSH: 0.621 u[IU]/mL (ref 0.350–4.500)

## 2015-06-08 LAB — TROPONIN I

## 2015-06-08 MED ORDER — ACETAMINOPHEN 500 MG PO TABS
1000.0000 mg | ORAL_TABLET | Freq: Once | ORAL | Status: AC
  Administered 2015-06-08: 1000 mg via ORAL
  Filled 2015-06-08: qty 2

## 2015-06-08 MED ORDER — ASPIRIN EC 81 MG PO TBEC
81.0000 mg | DELAYED_RELEASE_TABLET | Freq: Every day | ORAL | Status: DC
  Administered 2015-06-09 – 2015-06-10 (×2): 81 mg via ORAL
  Filled 2015-06-08 (×2): qty 1

## 2015-06-08 MED ORDER — IOHEXOL 300 MG/ML  SOLN
75.0000 mL | Freq: Once | INTRAMUSCULAR | Status: AC | PRN
  Administered 2015-06-08: 75 mL via INTRAVENOUS

## 2015-06-08 MED ORDER — CALCIUM CARBONATE 600 MG PO TABS
1.0000 | ORAL_TABLET | Freq: Every day | ORAL | Status: DC
  Filled 2015-06-08: qty 1

## 2015-06-08 MED ORDER — VITAMIN D (ERGOCALCIFEROL) 1.25 MG (50000 UNIT) PO CAPS
50000.0000 [IU] | ORAL_CAPSULE | ORAL | Status: DC
  Administered 2015-06-13: 50000 [IU] via ORAL
  Filled 2015-06-08: qty 1

## 2015-06-08 MED ORDER — INSULIN ASPART 100 UNIT/ML ~~LOC~~ SOLN
0.0000 [IU] | Freq: Three times a day (TID) | SUBCUTANEOUS | Status: DC
  Administered 2015-06-09: 1 [IU] via SUBCUTANEOUS
  Administered 2015-06-09 (×2): 2 [IU] via SUBCUTANEOUS
  Administered 2015-06-10: 1 [IU] via SUBCUTANEOUS
  Administered 2015-06-10: 2 [IU] via SUBCUTANEOUS
  Administered 2015-06-11: 1 [IU] via SUBCUTANEOUS
  Administered 2015-06-11 – 2015-06-12 (×2): 2 [IU] via SUBCUTANEOUS
  Administered 2015-06-12: 3 [IU] via SUBCUTANEOUS
  Administered 2015-06-13 (×2): 2 [IU] via SUBCUTANEOUS
  Administered 2015-06-14: 1 [IU] via SUBCUTANEOUS
  Filled 2015-06-08: qty 1
  Filled 2015-06-08: qty 2
  Filled 2015-06-08: qty 3
  Filled 2015-06-08: qty 2
  Filled 2015-06-08: qty 1
  Filled 2015-06-08 (×2): qty 2
  Filled 2015-06-08: qty 1
  Filled 2015-06-08 (×3): qty 2
  Filled 2015-06-08: qty 1
  Filled 2015-06-08: qty 2
  Filled 2015-06-08: qty 1

## 2015-06-08 MED ORDER — IOHEXOL 240 MG/ML SOLN
25.0000 mL | INTRAMUSCULAR | Status: AC
  Administered 2015-06-08 (×2): 25 mL via ORAL

## 2015-06-08 MED ORDER — CALCIUM CARBONATE 1250 (500 CA) MG PO TABS
1.0000 | ORAL_TABLET | Freq: Every day | ORAL | Status: DC
  Administered 2015-06-09 – 2015-06-10 (×2): 500 mg via ORAL
  Filled 2015-06-08 (×4): qty 1

## 2015-06-08 MED ORDER — ENOXAPARIN SODIUM 30 MG/0.3ML ~~LOC~~ SOLN: 30.0000 mg | SUBCUTANEOUS | Status: DC

## 2015-06-08 MED ORDER — ACETAMINOPHEN 325 MG PO TABS
650.0000 mg | ORAL_TABLET | Freq: Four times a day (QID) | ORAL | Status: DC | PRN
  Administered 2015-06-09 – 2015-06-12 (×9): 650 mg via ORAL
  Filled 2015-06-08 (×3): qty 2
  Filled 2015-06-08: qty 1
  Filled 2015-06-08 (×6): qty 2

## 2015-06-08 MED ORDER — METFORMIN HCL ER 500 MG PO TB24: 500.0000 mg | ORAL_TABLET | Freq: Every day | ORAL | Status: DC

## 2015-06-08 MED ORDER — ACETAMINOPHEN 650 MG RE SUPP: 650.0000 mg | Freq: Four times a day (QID) | RECTAL | Status: DC | PRN

## 2015-06-08 MED ORDER — ONDANSETRON HCL 4 MG PO TABS: 4.0000 mg | ORAL_TABLET | Freq: Four times a day (QID) | ORAL | Status: DC | PRN

## 2015-06-08 MED ORDER — GLIPIZIDE ER 5 MG PO TB24
5.0000 mg | ORAL_TABLET | Freq: Every day | ORAL | Status: DC
  Filled 2015-06-08 (×3): qty 1

## 2015-06-08 MED ORDER — SODIUM CHLORIDE 0.9 % IV SOLN
Freq: Once | INTRAVENOUS | Status: AC
  Administered 2015-06-08: 13:00:00 via INTRAVENOUS

## 2015-06-08 MED ORDER — HYDROCHLOROTHIAZIDE 50 MG PO TABS
50.0000 mg | ORAL_TABLET | Freq: Every day | ORAL | Status: DC
  Administered 2015-06-09 – 2015-06-14 (×6): 50 mg via ORAL
  Filled 2015-06-08 (×11): qty 1

## 2015-06-08 MED ORDER — PRAVASTATIN SODIUM 20 MG PO TABS
40.0000 mg | ORAL_TABLET | Freq: Every day | ORAL | Status: DC
  Administered 2015-06-09 – 2015-06-14 (×6): 40 mg via ORAL
  Filled 2015-06-08 (×6): qty 2

## 2015-06-08 MED ORDER — POTASSIUM CHLORIDE CRYS ER 10 MEQ PO TBCR
10.0000 meq | EXTENDED_RELEASE_TABLET | Freq: Every day | ORAL | Status: DC
  Administered 2015-06-09 – 2015-06-14 (×6): 10 meq via ORAL
  Filled 2015-06-08 (×6): qty 1

## 2015-06-08 MED ORDER — DONEPEZIL HCL 5 MG PO TABS
5.0000 mg | ORAL_TABLET | Freq: Every day | ORAL | Status: DC
  Administered 2015-06-09 – 2015-06-14 (×6): 5 mg via ORAL
  Filled 2015-06-08 (×6): qty 1

## 2015-06-08 MED ORDER — METOPROLOL TARTRATE 100 MG PO TABS
100.0000 mg | ORAL_TABLET | Freq: Every day | ORAL | Status: DC
  Administered 2015-06-09 – 2015-06-14 (×6): 100 mg via ORAL
  Filled 2015-06-08 (×6): qty 1

## 2015-06-08 MED ORDER — SODIUM CHLORIDE 0.9 % IV SOLN
INTRAVENOUS | Status: DC
  Administered 2015-06-08 – 2015-06-10 (×4): via INTRAVENOUS

## 2015-06-08 MED ORDER — DEXTROSE 5 % IV SOLN
2.0000 g | Freq: Once | INTRAVENOUS | Status: AC
  Administered 2015-06-08: 2 g via INTRAVENOUS
  Filled 2015-06-08: qty 2

## 2015-06-08 MED ORDER — METOPROLOL-HYDROCHLOROTHIAZIDE 100-50 MG PO TABS: 1.0000 | ORAL_TABLET | Freq: Every day | ORAL | Status: DC

## 2015-06-08 MED ORDER — MEMANTINE HCL ER 28 MG PO CP24
28.0000 mg | ORAL_CAPSULE | Freq: Every day | ORAL | Status: DC
  Administered 2015-06-09 – 2015-06-14 (×6): 28 mg via ORAL
  Filled 2015-06-08 (×7): qty 1

## 2015-06-08 MED ORDER — LISINOPRIL 20 MG PO TABS
40.0000 mg | ORAL_TABLET | Freq: Every day | ORAL | Status: DC
  Administered 2015-06-09 – 2015-06-14 (×6): 40 mg via ORAL
  Filled 2015-06-08 (×6): qty 2

## 2015-06-08 MED ORDER — ONDANSETRON HCL 4 MG/2ML IJ SOLN: 4.0000 mg | Freq: Four times a day (QID) | INTRAMUSCULAR | Status: DC | PRN

## 2015-06-08 NOTE — ED Notes (Signed)
Family requesting pt to be admitted. Dr. made aware.

## 2015-06-08 NOTE — ED Provider Notes (Addendum)
[facility] Emergency Department Provider Note     Time seen: ----------------------------------------- 11:44 AM on 06/08/2015 -----------------------------------------  L5 caveat: Patient with a history of dementia, review systems and history is obtained from family  I have reviewed the triage vital signs and the nursing notes.   HISTORY  Chief Complaint Weakness    HPI Briana Austin is a 81 y.o. female who presents ER for weakness and fatigue and not eating well for a week. Reportedly was seen by her primary care doctor yesterday and started on antibiotics which was amoxicillin. Fever was 102.7, last dose of Tylenol was at 5 AM this morning. According to report she's had decreased by mouth intake and has been weak but no other focal complaints   Past Medical History  Diagnosis Date  . History of breast cancer   . Dementia   . Diabetes mellitus without complication   . Hypertension   . Cancer     Patient Active Problem List   Diagnosis Date Noted  . Abnormal loss of weight 03/25/2015  . Decreased appetite 03/25/2015  . Dementia 03/25/2015  . Diabetes mellitus, type II 03/25/2015  . Hyperlipidemia 03/25/2015  . Hypertension 03/25/2015  . Normocytic anemia 03/25/2015  . Physical deconditioning 03/25/2015  . Vitamin D deficiency 11/17/2010    Past Surgical History  Procedure Laterality Date  . Sigmoid polyp excision  11/24/2004    Hyperplastic polyp  . Breast lumpectomy Right 2005    Carcinoma in situ of right breast  . Total abdominal hysterectomy w/ bilateral salpingoophorectomy  1981    Benign    Allergies Review of patient's allergies indicates no active allergies.  Social History History  Substance Use Topics  . Smoking status: Former Smoker -- 0.50 packs/day for 8 years    Types: Cigarettes  . Smokeless tobacco: Never Used  . Alcohol Use: No    Review of Systems Constitutional: Positive for fever Eyes: Negative for  visual changes. ENT: Negative for sore throat. Cardiovascular: Negative for chest pain. Respiratory: Negative for shortness of breath. Gastrointestinal: Negative for abdominal pain, vomiting and diarrhea. Genitourinary: Negative for dysuria. Musculoskeletal: Negative for back pain. Skin: Negative for rash. Neurological: Negative for headaches, positive for weakness and lethargy  10-point ROS otherwise negative.  ____________________________________________   PHYSICAL EXAM:  VITAL SIGNS: ED Triage Vitals  Enc Vitals Group     BP 06/08/15 1119 120/61 mmHg     Pulse Rate 06/08/15 1119 97     Resp 06/08/15 1119 18     Temp 06/08/15 1119 102.7 F (39.3 C)     Temp Source 06/08/15 1119 Oral     SpO2 06/08/15 1119 94 %     Weight 06/08/15 1119 162 lb (73.483 kg)     Height 06/08/15 1119 5\' 8"  (1.727 m)     Head Cir --      Peak Flow --      Pain Score --      Pain Loc --      Pain Edu? --      Excl. in [place]? --     Constitutional: Alert but disoriented. Well appearing and in no distress. Eyes: Conjunctivae are normal. PERRL. Normal extraocular movements. ENT   Head: Normocephalic and atraumatic.   Nose: No congestion/rhinnorhea.   Mouth/Throat: Mucous membranes are moist.   Neck: No stridor. Cardiovascular: Rapid rate, regular rhythm. Normal and symmetric distal pulses are present in all extremities. No murmurs, rubs, or gallops. Respiratory: Normal respiratory effort without  tachypnea nor retractions. Breath sounds are clear and equal bilaterally. No wheezes/rales/rhonchi. Gastrointestinal: Soft and nontender. No distention. No abdominal bruits.  Musculoskeletal: Nontender with normal range of motion in all extremities. No joint effusions.  No lower extremity tenderness nor edema. Neurologic:  Normal speech and language. No gross focal neurologic deficits are appreciated.  Skin:  Skin is warm, dry and intact. No rash  noted.  ____________________________________________  EKG: Interpreted by me. Sinus tachycardia with a rate of 118 bpm, normal PR interval, normal QRS with, normal QT interval. Incomplete right bundle branch block.  ____________________________________________  ED COURSE:  Pertinent labs & imaging results that were available during my care of the patient were reviewed by me and considered in my medical decision making (see chart for details). Patient will need sepsis workup including lactic acid and repeat of labs. ____________________________________________    LABS (pertinent positives/negatives)  Labs Reviewed  CBC WITH DIFFERENTIAL/PLATELET - Abnormal; Notable for the following:    RBC 3.24 (*)    Hemoglobin 10.3 (*)    HCT 30.4 (*)    Neutro Abs 7.8 (*)    Lymphs Abs 0.7 (*)    All other components within normal limits  COMPREHENSIVE METABOLIC PANEL - Abnormal; Notable for the following:    Sodium 133 (*)    Chloride 96 (*)    Glucose, Bld 231 (*)    BUN 36 (*)    Creatinine, Ser 1.52 (*)    GFR calc non Af Amer 31 (*)    GFR calc Af Amer 36 (*)    All other components within normal limits  URINALYSIS COMPLETEWITH MICROSCOPIC ([facility] ONLY) - Abnormal; Notable for the following:    Color, Urine YELLOW (*)    APPearance HAZY (*)    Protein, ur 30 (*)    Bacteria, UA RARE (*)    Squamous Epithelial / LPF 6-30 (*)    All other components within normal limits  TROPONIN I  LACTIC ACID, PLASMA  LACTIC ACID, PLASMA  POCT RAPID STREP A    RADIOLOGY  Chest x-ray yesterday was unremarkable  ____________________________________________  FINAL ASSESSMENT AND PLAN  Fever of unknown origin, tachycardia, lethargy  Plan: Patient with labs and imaging as dictated above. Patient was given a liter fluid bolus here as well as IV Rocephin. No clear etiology for her symptoms is found. She has no meningeal signs, and she has no complaints currently. Family is uncomfortable  taking her home, I suspect a viral pharyngitis. We'll recommend observation the hospital.   Earleen Newport, MD   Earleen Newport, MD 06/08/15 [place], MD 06/08/15 [phone]

## 2015-06-08 NOTE — H&P (Signed)
[facility] [place] NAME: Briana Austin    MR#:  [id number]  DATE OF BIRTH:  1934/05/12  DATE OF ADMISSION:  06/08/2015  PRIMARY CARE PHYSICIAN: Lelon Huh, MD   REQUESTING/REFERRING PHYSICIAN: Lenise Arena  CHIEF COMPLAINT:   Chief Complaint  Patient presents with  . Weakness    HISTORY OF PRESENT ILLNESS: Briana Austin  is a 81 y.o. female with a known history of  breast cancer,, hypertension, diabetes, hyperlipidemia and dementia presents to the emergency room with complaints of feeling very weak and tired for the past few days. She was seen by her primary care provider and was started on Augmentin at that time also was noted to have fever of 102. Patient presents to the ED today complaining of similar type of symptoms. She is noted to have a temperature 102.7 here. She underwent a chest x-ray which did not show any abnormality her urinalysis was negative. Her WBC count is normal as well. Patient also endorses weight loss. Over the past few months. She has some dry cough but according the family slight clearing her throat from time to time. There is no production to the cough. No chest pains palpitations no shortness of breath. No nausea vomiting or diarrhea. Denies any urinary frequency urgency or hesitancy.  PAST MEDICAL HISTORY:   Past Medical History  Diagnosis Date  . History of breast cancer   . Dementia   . Diabetes mellitus without complication   . Hypertension   . Cancer     PAST SURGICAL HISTORY:  Past Surgical History  Procedure Laterality Date  . Sigmoid polyp excision  11/24/2004    Hyperplastic polyp  . Breast lumpectomy Right 2005    Carcinoma in situ of right breast  . Total abdominal hysterectomy w/ bilateral salpingoophorectomy  1981    Benign    SOCIAL HISTORY:  History  Substance Use Topics  . Smoking status: Former Smoker -- 0.50 packs/day for 8 years    Types: Cigarettes  . Smokeless  tobacco: Never Used  . Alcohol Use: No    FAMILY HISTORY:  Family History  Problem Relation Age of Onset  . Stroke Mother   . Dementia Brother     DRUG ALLERGIES: No Known Allergies  REVIEW OF SYSTEMS:   CONSTITUTIONAL: Positive fever, positive fatigue or positive weakness.  EYES: No blurred or double vision.  EARS, NOSE, AND THROAT: No tinnitus or ear pain.  RESPIRATORY: Positive cough, shortness of breath, wheezing or hemoptysis.  CARDIOVASCULAR: No chest pain, orthopnea, edema.  GASTROINTESTINAL: No nausea, vomiting, diarrhea or abdominal pain.  GENITOURINARY: No dysuria, hematuria.  ENDOCRINE: No polyuria, nocturia,  HEMATOLOGY: No anemia, easy bruising or bleeding SKIN: No rash or lesion. MUSCULOSKELETAL: No joint pain or arthritis.   NEUROLOGIC: No tingling, numbness, weakness.  PSYCHIATRY: No anxiety or depression.   MEDICATIONS AT HOME:  Prior to Admission medications   Medication Sig Start Date End Date Taking? Authorizing Provider  amoxicillin (AMOXIL) 500 MG capsule Take 2 capsules (1,000 mg total) by mouth 2 (two) times daily. 06/07/15 06/17/15 Yes Birdie Sons, MD  aspirin EC 81 MG tablet Take 81 mg by mouth daily.   Yes Historical Provider, MD  calcium carbonate (OS-CAL) 600 MG TABS tablet Take 1 tablet by mouth daily.   Yes Historical Provider, MD  donepezil (ARICEPT) 5 MG tablet Take 1 tablet by mouth daily. 12/04/14  Yes Historical Provider, MD  glipiZIDE (GLUCOTROL XL) 5 MG 24  hr tablet Take 1 tablet by mouth daily. 11/05/14  Yes Historical Provider, MD  lisinopril (PRINIVIL,ZESTRIL) 40 MG tablet Take 1 tablet (40 mg total) by mouth daily. 05/03/15  Yes Margarita Rana, MD  memantine (NAMENDA XR) 28 MG CP24 24 hr capsule Take 1 capsule by mouth daily. 02/02/15  Yes Birdie Sons, MD  metFORMIN (GLUCOPHAGE-XR) 500 MG 24 hr tablet Take 1 tablet by mouth daily. 03/14/15  Yes Historical Provider, MD  metoprolol-hydrochlorothiazide (LOPRESSOR HCT) 100-50 MG per tablet TAKE  ONE TABLET BY MOUTH ONCE DAILY 06/03/15  Yes Birdie Sons, MD  potassium chloride (K-DUR,KLOR-CON) 10 MEQ tablet Take 1 tablet by mouth daily. 11/05/14  Yes Historical Provider, MD  pravastatin (PRAVACHOL) 40 MG tablet Take 1 tablet (40 mg total) by mouth daily. 05/03/15  Yes Margarita Rana, MD  Vitamin D, Ergocalciferol, (DRISDOL) 50000 UNITS CAPS capsule Take 1 capsule by mouth once a week. Take on Tuesday. 09/30/14  Yes Historical Provider, MD      PHYSICAL EXAMINATION:   VITAL SIGNS: Blood pressure 134/96, pulse 100, temperature 102.7 F (39.3 C), temperature source Oral, resp. rate 18, height 5\' 8"  (1.727 m), weight 73.483 kg (162 lb), SpO2 98 %.  GENERAL:  81 y.o.-year-old patient lying in the bed with no acute distress. Appears weak  EYES: Pupils equal, round, reactive to light and accommodation. No scleral icterus. Extraocular muscles intact.  HEENT: Head atraumatic, normocephalic. Oropharynx and nasopharynx clear.  NECK:  Supple, no jugular venous distention. No thyroid enlargement, no tenderness.  LUNGS: Normal breath sounds bilaterally, no wheezing, rales,rhonchi or crepitation. No use of accessory muscles of respiration.  CARDIOVASCULAR: S1, S2 normal. No murmurs, rubs, or gallops.  ABDOMEN: Soft, nontender, nondistended. Bowel sounds present. No organomegaly or mass.  EXTREMITIES: No pedal edema, cyanosis, or clubbing.  NEUROLOGIC: Cranial nerves II through XII are intact. Muscle strength 5/5 in all extremities. Sensation intact. Gait not checked.  PSYCHIATRIC: The patient is alert and oriented x 3.  SKIN: No obvious rash, lesion, or ulcer.   LABORATORY PANEL:   CBC  Recent Labs Lab 06/07/15 1042 06/08/15 1224  WBC 9.3 9.4  HGB  --  10.3*  HCT 32.3* 30.4*  PLT  --  194  MCV  --  94.0  MCH 30.7 31.8  MCHC 33.1 33.8  RDW 14.2 13.6  LYMPHSABS  --  0.7*  MONOABS  --  0.8  EOSABS  --  0.0  BASOSABS  --  0.0    ------------------------------------------------------------------------------------------------------------------  Chemistries   Recent Labs Lab 06/07/15 1042 06/08/15 1224  NA 134 133*  K 4.4 4.5  CL 93* 96*  CO2 22 26  GLUCOSE 154* 231*  BUN 30* 36*  CREATININE 1.51* 1.52*  CALCIUM 10.3 10.0  AST 20 32  ALT 16 21  ALKPHOS 69 58  BILITOT 0.5 0.5   ------------------------------------------------------------------------------------------------------------------ estimated creatinine clearance is 30.3 mL/min (by C-G formula based on Cr of 1.52). ------------------------------------------------------------------------------------------------------------------ No results for input(s): TSH, T4TOTAL, T3FREE, THYROIDAB in the last 72 hours.  Invalid input(s): FREET3   Coagulation profile No results for input(s): INR, PROTIME in the last 168 hours. ------------------------------------------------------------------------------------------------------------------- No results for input(s): DDIMER in the last 72 hours. -------------------------------------------------------------------------------------------------------------------  Cardiac Enzymes  Recent Labs Lab 06/08/15 1224  TROPONINI <0.03   ------------------------------------------------------------------------------------------------------------------ Invalid input(s): POCBNP  ---------------------------------------------------------------------------------------------------------------  Urinalysis    Component Value Date/Time   COLORURINE YELLOW* 06/08/2015 1422   APPEARANCEUR HAZY* 06/08/2015 1422   LABSPEC 1.020 06/08/2015 1422   PHURINE 5.0 06/08/2015  [place] 06/08/2015 [place] 06/08/2015 [place] 06/08/2015 1422   BILIRUBINUR negative 06/07/2015 [place] 06/08/2015 1422   PROTEINUR 30* 06/08/2015 1422   PROTEINUR negative 06/07/2015  1007   UROBILINOGEN 2.0 06/07/2015 1007   NITRITE NEGATIVE 06/08/2015 1422   NITRITE negative 06/07/2015 1007   LEUKOCYTESUR NEGATIVE 06/08/2015 1422     RADIOLOGY: Dg Chest 2 View  06/07/2015   CLINICAL DATA:  Onset of cough and wheezing 5 days ago, former smoker, history of diabetes  EXAM: CHEST  2 VIEW  COMPARISON:  Chest x-ray of June 22, 2013  FINDINGS: The lungs are well-expanded. The interstitial markings are coarse bilaterally. The heart is normal in size. The pulmonary vascularity is not engorged. There is no pleural effusion or pneumothorax. There is a small hiatal hernia. There is tortuosity of the descending thoracic aorta. The bony thorax is unremarkable.  IMPRESSION: Chronic bronchitic changes. There is no pneumonia nor other acute cardiopulmonary abnormality.   Electronically Signed   By: David  Martinique M.D.   On: 06/07/2015 12:42    EKG: Orders placed or performed during the hospital encounter of 06/08/15  . EKG 12-Lead  . EKG 12-Lead  . EKG 12-Lead  . EKG 12-Lead    IMPRESSION AND PLAN: Patient is a 81 year old African-American female presents with fatigue and weakness weakness high-grade fevers  1. Fevers: Unclear etiology , at this time will go ahead and get a CT of the abdomen pelvis especially in light of patient having weight loss. To make sure that there is no malignancy or any other processes causing her to have these fevers. At this time I'll hold off on any anabiotic's until we have a source. Follow up on the blood cultures   2. Diabetes type 2 based on her recent hemoglobin A1c of greater than 10, place on sliding scale insulin continue glipizide and metformin patient may need insulin  3. Hypertension continue lisinopril, metoprolol and HCTZ  4. Hyperlipidemia continue pravastatin  5. Dementia continue Aricept  6. Miscellaneous we'll do Lovenox for DVT prophylaxis   All the records are reviewed and case discussed with ED provider. Management plans  discussed with the patient, family and they are in agreement.  CODE STATUS: Advance Directive Documentation        Most Recent Value   Type of Advance Directive  Healthcare Power of Attorney   Pre-existing out of facility DNR order (yellow form or pink MOST form)     "MOST" Form in Place?         TOTAL TIME TAKING CARE OF THIS PATIENT:  52minutes.    Dustin Flock M.D on 06/08/2015 at 3:41 PM  Between 7am to 6pm - Pager - [phone]  After 6pm go to [email or web address] - password [email or web address] [facility]  Office  [phone]  CC: Primary care physician; Lelon Huh, MD

## 2015-06-08 NOTE — ED Notes (Signed)
Weakness and fatigue, and not eating well for a week,  Was seen at PCP yesterday and started on ABX , pt currently with fever of 102.7, last dose tylenol 5 am this morning of 650mg 

## 2015-06-09 DIAGNOSIS — G510 Bell's palsy: Secondary | ICD-10-CM | POA: Diagnosis not present

## 2015-06-09 DIAGNOSIS — K5720 Diverticulitis of large intestine with perforation and abscess without bleeding: Secondary | ICD-10-CM | POA: Diagnosis not present

## 2015-06-09 DIAGNOSIS — I10 Essential (primary) hypertension: Secondary | ICD-10-CM | POA: Diagnosis not present

## 2015-06-09 DIAGNOSIS — G610 Guillain-Barre syndrome: Secondary | ICD-10-CM | POA: Diagnosis not present

## 2015-06-09 DIAGNOSIS — R509 Fever, unspecified: Secondary | ICD-10-CM | POA: Diagnosis not present

## 2015-06-09 DIAGNOSIS — R5383 Other fatigue: Secondary | ICD-10-CM | POA: Diagnosis not present

## 2015-06-09 DIAGNOSIS — J189 Pneumonia, unspecified organism: Secondary | ICD-10-CM | POA: Diagnosis not present

## 2015-06-09 LAB — GLUCOSE, CAPILLARY
GLUCOSE-CAPILLARY: 158 mg/dL — AB (ref 65–99)
GLUCOSE-CAPILLARY: 125 mg/dL — AB (ref 65–99)
Glucose-Capillary: 158 mg/dL — ABNORMAL HIGH (ref 65–99)
Glucose-Capillary: 162 mg/dL — ABNORMAL HIGH (ref 65–99)

## 2015-06-09 LAB — BASIC METABOLIC PANEL
Anion gap: 10 (ref 5–15)
BUN: 22 mg/dL — AB (ref 6–20)
CALCIUM: 9.1 mg/dL (ref 8.9–10.3)
CO2: 23 mmol/L (ref 22–32)
Chloride: 101 mmol/L (ref 101–111)
Creatinine, Ser: 1.14 mg/dL — ABNORMAL HIGH (ref 0.44–1.00)
GFR, EST AFRICAN AMERICAN: 52 mL/min — AB (ref >60)
GFR, EST NON AFRICAN AMERICAN: 45 mL/min — AB (ref >60)
Glucose, Bld: 147 mg/dL — ABNORMAL HIGH (ref 65–99)
Potassium: 4.1 mmol/L (ref 3.5–5.1)
Sodium: 134 mmol/L — ABNORMAL LOW (ref 135–145)

## 2015-06-09 LAB — CBC
HCT: 26.9 % — ABNORMAL LOW (ref 35.0–47.0)
Hemoglobin: 9.4 g/dL — ABNORMAL LOW (ref 12.0–16.0)
MCH: 32.4 pg (ref 26.0–34.0)
MCHC: 34.8 g/dL (ref 32.0–36.0)
MCV: 93.2 fL (ref 80.0–100.0)
Platelets: 185 10*3/uL (ref 150–440)
RBC: 2.89 MIL/uL — ABNORMAL LOW (ref 3.80–5.20)
RDW: 13.5 % (ref 11.5–14.5)
WBC: 7.4 10*3/uL (ref 3.6–11.0)

## 2015-06-09 MED ORDER — ENSURE ENLIVE PO LIQD
237.0000 mL | Freq: Two times a day (BID) | ORAL | Status: DC
  Administered 2015-06-09 – 2015-06-14 (×9): 237 mL via ORAL

## 2015-06-09 MED ORDER — CEFTRIAXONE SODIUM 1 G IJ SOLR
1.0000 g | INTRAMUSCULAR | Status: DC
Start: 2015-06-09 — End: 2015-06-10
  Administered 2015-06-09: 1 g via INTRAVENOUS
  Filled 2015-06-09 (×3): qty 10

## 2015-06-09 MED ORDER — ENOXAPARIN SODIUM 40 MG/0.4ML ~~LOC~~ SOLN
40.0000 mg | SUBCUTANEOUS | Status: DC
  Administered 2015-06-09 – 2015-06-13 (×5): 40 mg via SUBCUTANEOUS
  Filled 2015-06-09 (×5): qty 0.4

## 2015-06-09 MED ORDER — AZITHROMYCIN 500 MG IV SOLR
500.0000 mg | INTRAVENOUS | Status: DC
Start: 2015-06-09 — End: 2015-06-10
  Administered 2015-06-09: 500 mg via INTRAVENOUS
  Filled 2015-06-09 (×3): qty 500

## 2015-06-09 NOTE — Progress Notes (Signed)
Pharmacy Note - Enoxaparin  Patient with orders for enoxaparin 30mg  SQ Q24H for DVT prophylaxis.  Estimated Creatinine Clearance: 40.4 mL/min (by C-G formula based on Cr of 1.14).   Dose previously reduced for CrCl < 48ml/min, however renal function improved. Will change to enoxaparin 40mg  SQ V57B per policy for CrCl > 22VO/HCS and patient weight > 45kg.   Rexene Edison, PharmD Clinical Pharmacist  06/09/2015 12:39 PM

## 2015-06-09 NOTE — Progress Notes (Signed)
Called Dr. Jannifer Franklin @ [phone] in regards to ensure feeding supplement per family request.  Doctor ordered ensure starting in the morning.  Briana Austin  06/09/2015  2:11 AM

## 2015-06-09 NOTE — Progress Notes (Signed)
[facility] [place] NAME: Briana Austin    MR#:  [id number]  DATE OF BIRTH:  1934/07/27  SUBJECTIVE:  CHIEF COMPLAINT:   Chief Complaint  Patient presents with  . Weakness   No acute complaints. She does have a mild headache which is in both temples. She has eaten fairly well for breakfast. Last high fever 103 at about 1 AM  REVIEW OF SYSTEMS:   Review of Systems  Constitutional: Negative for fever.  Respiratory: Negative for shortness of breath.   Cardiovascular: Negative for chest pain and palpitations.  Gastrointestinal: Negative for nausea, vomiting and abdominal pain.  Genitourinary: Negative for dysuria.    DRUG ALLERGIES:  No Known Allergies  VITALS:  Blood pressure 138/70, pulse 86, temperature 98.4 F (36.9 C), temperature source Oral, resp. rate 18, height 5\' 8"  (1.727 m), weight 73.483 kg (162 lb), SpO2 97 %.  PHYSICAL EXAMINATION:  GENERAL:  81 y.o.-year-old patient , sitting up in a chair, no distress EYES: Pupils equal, round, reactive to light and accommodation. No scleral icterus. Extraocular muscles intact.  HEENT: Head atraumatic, normocephalic. Oropharynx and nasopharynx clear.  NECK:  Supple, no jugular venous distention. No thyroid enlargement, no tenderness.  LUNGS: Normal breath sounds bilaterally, no wheezing, rales,rhonchi or crepitation. No use of accessory muscles of respiration.  CARDIOVASCULAR: S1, S2 normal. No murmurs, rubs, or gallops.  ABDOMEN: Soft, nontender, nondistended. Bowel sounds present. No organomegaly or mass.  EXTREMITIES: No pedal edema, cyanosis, or clubbing.  NEUROLOGIC: Cranial nerves II through XII are intact. Muscle strength 5/5 in all extremities. Sensation intact. Gait not checked.  PSYCHIATRIC: The patient is alert and oriented to person and place, not to date SKIN: No obvious rash, lesion, or ulcer.    LABORATORY PANEL:   CBC  Recent Labs Lab 06/09/15 0604   WBC 7.4  HGB 9.4*  HCT 26.9*  PLT 185   ------------------------------------------------------------------------------------------------------------------  Chemistries   Recent Labs Lab 06/08/15 1224 06/09/15 0604  NA 133* 134*  K 4.5 4.1  CL 96* 101  CO2 26 23  GLUCOSE 231* 147*  BUN 36* 22*  CREATININE 1.52* 1.14*  CALCIUM 10.0 9.1  AST 32  --   ALT 21  --   ALKPHOS 58  --   BILITOT 0.5  --    ------------------------------------------------------------------------------------------------------------------  Cardiac Enzymes  Recent Labs Lab 06/08/15 1224  TROPONINI <0.03   ------------------------------------------------------------------------------------------------------------------  RADIOLOGY:  Ct Chest W Contrast  06/09/2015   CLINICAL DATA:  Pt with dementia and unable to give any hx. Per MD note in chart pt "with a known history of breast cancer, hypertension, diabetes, hyperlipidemia and dementia presents to the emergency room with complaints of feeling very weak and tired for the past few days. She was seen by her primary care provider and was started on Augmentin at that time also was noted to have fever of 102. Patient presents to the ED today complaining of similar type of symptoms. She is noted to have a temperature 102.7 here. She underwent a chest x-ray which did not show any abnormality her urinalysis was negative. Her WBC count is normal as well. Patient also endorses weight loss. Over the past few months. She has some dry cough but according the family slight clearing her throat from time to time. There is no production to the cough. No chest pains palpitations no shortness of breath. No nausea vomiting or diarrhea. Denies any urinary frequency urgency or hesitancy."  EXAM:  CT CHEST, ABDOMEN, AND PELVIS WITH CONTRAST  TECHNIQUE: Multidetector CT imaging of the chest, abdomen and pelvis was performed following the standard protocol during bolus administration  of intravenous contrast.  CONTRAST:  70mL OMNIPAQUE IOHEXOL 300 MG/ML  SOLN  COMPARISON:  Chest radiograph 06/07/2015.  FINDINGS: CT CHEST FINDINGS  Thoracic inlet: Right thyroid nodules largest measuring 12 mm. No neck base or axillary masses or adenopathy.  Mediastinum and hila: Heart mildly enlarged. There are dense coronary artery calcifications. Great vessels normal in caliber for age. Atherosclerotic calcifications along the aortic arch, descending thoracic aorta and aortic arch branch vessels. No mediastinal or hilar masses or pathologically enlarged lymph nodes. Nonspecific mild dilation of distal esophagus. No evidence of an esophageal mass.  Lungs and pleura: Patchy consolidation is noted in the posterior right lower lobe consistent with pneumonia. There is mild subsegmental atelectasis in both posterior lung bases. No lung mass or suspicious nodule. No pulmonary edema. No pleural effusion or pneumothorax.  CT ABDOMEN AND PELVIS FINDINGS  Liver, spleen, gallbladder, pancreas, adrenal glands:  Unremarkable.  Kidneys, ureters, bladder: Several small low-density renal lesions consistent with cysts. No stones. No hydronephrosis. Ureters normal in course and in caliber. Bladder is unremarkable.  Uterus and adnexa:  Uterus surgically absent.  No adnexal masses.  Lymph nodes:  No enlarged lymph nodes.  Ascites: None.  Vascular: Dense atherosclerotic calcifications along a mildly ectatic abdominal aorta and its branch vessels. No aneurysm.  Gastrointestinal: There is mild wall thickening along the mid sigmoid colon over a short segment with mild adjacent fat stranding. Although this may be chronic, minimal uncomplicated diverticulitis should be suspected if there are consistent clinical symptoms. There are multiple other colonic diverticula with no other evidence of inflammation. There is no bowel dilation. Small bowel is unremarkable. No appendix visualized.  MUSCULOSKELETAL FINDINGS  No osteoblastic or  osteolytic lesions. There are only mild degenerative changes throughout visualized spine.  IMPRESSION: 1. Right lower lobe pneumonia. 2. Small area of wall thickening and mild adjacent inflammatory stranding of the sigmoid colon. This may be chronic or reflect mild uncomplicated diverticulitis. 3. No other evidence of an acute abnormality within the chest, abdomen or pelvis.   Electronically Signed   By: Lajean Manes M.D.   On: 06/09/2015 09:24   Ct Abdomen Pelvis W Contrast  06/09/2015   CLINICAL DATA:  Pt with dementia and unable to give any hx. Per MD note in chart pt "with a known history of breast cancer, hypertension, diabetes, hyperlipidemia and dementia presents to the emergency room with complaints of feeling very weak and tired for the past few days. She was seen by her primary care provider and was started on Augmentin at that time also was noted to have fever of 102. Patient presents to the ED today complaining of similar type of symptoms. She is noted to have a temperature 102.7 here. She underwent a chest x-ray which did not show any abnormality her urinalysis was negative. Her WBC count is normal as well. Patient also endorses weight loss. Over the past few months. She has some dry cough but according the family slight clearing her throat from time to time. There is no production to the cough. No chest pains palpitations no shortness of breath. No nausea vomiting or diarrhea. Denies any urinary frequency urgency or hesitancy."  EXAM: CT CHEST, ABDOMEN, AND PELVIS WITH CONTRAST  TECHNIQUE: Multidetector CT imaging of the chest, abdomen and pelvis was performed following the standard protocol during bolus administration of  intravenous contrast.  CONTRAST:  78mL OMNIPAQUE IOHEXOL 300 MG/ML  SOLN  COMPARISON:  Chest radiograph 06/07/2015.  FINDINGS: CT CHEST FINDINGS  Thoracic inlet: Right thyroid nodules largest measuring 12 mm. No neck base or axillary masses or adenopathy.  Mediastinum and hila:  Heart mildly enlarged. There are dense coronary artery calcifications. Great vessels normal in caliber for age. Atherosclerotic calcifications along the aortic arch, descending thoracic aorta and aortic arch branch vessels. No mediastinal or hilar masses or pathologically enlarged lymph nodes. Nonspecific mild dilation of distal esophagus. No evidence of an esophageal mass.  Lungs and pleura: Patchy consolidation is noted in the posterior right lower lobe consistent with pneumonia. There is mild subsegmental atelectasis in both posterior lung bases. No lung mass or suspicious nodule. No pulmonary edema. No pleural effusion or pneumothorax.  CT ABDOMEN AND PELVIS FINDINGS  Liver, spleen, gallbladder, pancreas, adrenal glands:  Unremarkable.  Kidneys, ureters, bladder: Several small low-density renal lesions consistent with cysts. No stones. No hydronephrosis. Ureters normal in course and in caliber. Bladder is unremarkable.  Uterus and adnexa:  Uterus surgically absent.  No adnexal masses.  Lymph nodes:  No enlarged lymph nodes.  Ascites: None.  Vascular: Dense atherosclerotic calcifications along a mildly ectatic abdominal aorta and its branch vessels. No aneurysm.  Gastrointestinal: There is mild wall thickening along the mid sigmoid colon over a short segment with mild adjacent fat stranding. Although this may be chronic, minimal uncomplicated diverticulitis should be suspected if there are consistent clinical symptoms. There are multiple other colonic diverticula with no other evidence of inflammation. There is no bowel dilation. Small bowel is unremarkable. No appendix visualized.  MUSCULOSKELETAL FINDINGS  No osteoblastic or osteolytic lesions. There are only mild degenerative changes throughout visualized spine.  IMPRESSION: 1. Right lower lobe pneumonia. 2. Small area of wall thickening and mild adjacent inflammatory stranding of the sigmoid colon. This may be chronic or reflect mild uncomplicated  diverticulitis. 3. No other evidence of an acute abnormality within the chest, abdomen or pelvis.   Electronically Signed   By: Lajean Manes M.D.   On: 06/09/2015 09:24    EKG:   Orders placed or performed during the hospital encounter of 06/08/15  . EKG 12-Lead  . EKG 12-Lead  . EKG 12-Lead  . EKG 12-Lead    ASSESSMENT AND PLAN:   #1 community-acquired pneumonia: Chest CT shows right lower lobe pneumonia. This could explain her fever, decreased appetite and altered mental status. Very difficult to assess as she has dementia. Continue Rocephin for now. Blood cultures pending. No respiratory symptoms. Will discharge tomorrow pending blood culture is and if she is afebrile for 24 hours.  #2 diabetes mellitus type 2, uncontrolled: Continue sliding scale and metformin. Discontinue glipizide  #3 hypertension: Continue lisinopril and metoprolol and hydrochlorothiazide  #4 hyperlipidemia: Continue statin therapy  #5 dementia: Continue Aricept. Family very supportive   CODE STATUS: Full  TOTAL TIME TAKING CARE OF THIS PATIENT: 35 minutes.  Greater than 50% of time spent in care coordination and counseling. Spoke with the family husband and daughter at the bedside. POSSIBLE D/C IN 1-2 DAYS, DEPENDING ON CLINICAL CONDITION.   Myrtis Ser M.D on 06/09/2015 at 11:34 AM  Between 7am to 6pm - Pager - [phone]  After 6pm go to [email or web address] - password [email or web address] [facility]  Office  [phone]  CC: Primary care physician; Lelon Huh, MD

## 2015-06-10 ENCOUNTER — Telehealth: Payer: Self-pay | Admitting: *Deleted

## 2015-06-10 DIAGNOSIS — Z87891 Personal history of nicotine dependence: Secondary | ICD-10-CM | POA: Diagnosis not present

## 2015-06-10 DIAGNOSIS — Z7982 Long term (current) use of aspirin: Secondary | ICD-10-CM | POA: Diagnosis not present

## 2015-06-10 DIAGNOSIS — Z9889 Other specified postprocedural states: Secondary | ICD-10-CM | POA: Diagnosis not present

## 2015-06-10 DIAGNOSIS — Z823 Family history of stroke: Secondary | ICD-10-CM | POA: Diagnosis not present

## 2015-06-10 DIAGNOSIS — Z79899 Other long term (current) drug therapy: Secondary | ICD-10-CM | POA: Diagnosis not present

## 2015-06-10 DIAGNOSIS — Z853 Personal history of malignant neoplasm of breast: Secondary | ICD-10-CM | POA: Diagnosis not present

## 2015-06-10 DIAGNOSIS — R509 Fever, unspecified: Secondary | ICD-10-CM | POA: Diagnosis present

## 2015-06-10 DIAGNOSIS — E1165 Type 2 diabetes mellitus with hyperglycemia: Secondary | ICD-10-CM | POA: Diagnosis present

## 2015-06-10 DIAGNOSIS — R51 Headache: Secondary | ICD-10-CM | POA: Diagnosis present

## 2015-06-10 DIAGNOSIS — I10 Essential (primary) hypertension: Secondary | ICD-10-CM | POA: Diagnosis present

## 2015-06-10 DIAGNOSIS — Z90710 Acquired absence of both cervix and uterus: Secondary | ICD-10-CM | POA: Diagnosis not present

## 2015-06-10 DIAGNOSIS — F0390 Unspecified dementia without behavioral disturbance: Secondary | ICD-10-CM | POA: Diagnosis present

## 2015-06-10 DIAGNOSIS — Z820 Family history of epilepsy and other diseases of the nervous system: Secondary | ICD-10-CM | POA: Diagnosis not present

## 2015-06-10 DIAGNOSIS — E785 Hyperlipidemia, unspecified: Secondary | ICD-10-CM | POA: Diagnosis present

## 2015-06-10 DIAGNOSIS — K5732 Diverticulitis of large intestine without perforation or abscess without bleeding: Secondary | ICD-10-CM | POA: Diagnosis present

## 2015-06-10 DIAGNOSIS — E119 Type 2 diabetes mellitus without complications: Secondary | ICD-10-CM | POA: Diagnosis present

## 2015-06-10 DIAGNOSIS — J189 Pneumonia, unspecified organism: Secondary | ICD-10-CM | POA: Diagnosis present

## 2015-06-10 LAB — BASIC METABOLIC PANEL
ANION GAP: 7 (ref 5–15)
BUN: 19 mg/dL (ref 6–20)
CO2: 25 mmol/L (ref 22–32)
Calcium: 9.1 mg/dL (ref 8.9–10.3)
Chloride: 102 mmol/L (ref 101–111)
Creatinine, Ser: 1.31 mg/dL — ABNORMAL HIGH (ref 0.44–1.00)
GFR calc non Af Amer: 38 mL/min — ABNORMAL LOW (ref >60)
GFR, EST AFRICAN AMERICAN: 44 mL/min — AB (ref >60)
GLUCOSE: 122 mg/dL — AB (ref 65–99)
Potassium: 4 mmol/L (ref 3.5–5.1)
Sodium: 134 mmol/L — ABNORMAL LOW (ref 135–145)

## 2015-06-10 LAB — CBC
HEMATOCRIT: 26.1 % — AB (ref 35.0–47.0)
Hemoglobin: 8.9 g/dL — ABNORMAL LOW (ref 12.0–16.0)
MCH: 32.1 pg (ref 26.0–34.0)
MCHC: 34.3 g/dL (ref 32.0–36.0)
MCV: 93.5 fL (ref 80.0–100.0)
Platelets: 179 10*3/uL (ref 150–440)
RBC: 2.79 MIL/uL — ABNORMAL LOW (ref 3.80–5.20)
RDW: 13.5 % (ref 11.5–14.5)
WBC: 7.9 10*3/uL (ref 3.6–11.0)

## 2015-06-10 LAB — GLUCOSE, CAPILLARY
GLUCOSE-CAPILLARY: 117 mg/dL — AB (ref 65–99)
GLUCOSE-CAPILLARY: 126 mg/dL — AB (ref 65–99)
Glucose-Capillary: 145 mg/dL — ABNORMAL HIGH (ref 65–99)
Glucose-Capillary: 169 mg/dL — ABNORMAL HIGH (ref 65–99)

## 2015-06-10 MED ORDER — AMOXICILLIN-POT CLAVULANATE 875-125 MG PO TABS
1.0000 | ORAL_TABLET | Freq: Two times a day (BID) | ORAL | Status: DC
  Administered 2015-06-10 – 2015-06-12 (×5): 1 via ORAL
  Filled 2015-06-10 (×5): qty 1

## 2015-06-10 MED ORDER — CALCIUM CARBONATE ANTACID 500 MG PO CHEW
2.5000 | CHEWABLE_TABLET | Freq: Every day | ORAL | Status: DC
  Administered 2015-06-11 – 2015-06-14 (×3): 500 mg via ORAL
  Filled 2015-06-10: qty 1
  Filled 2015-06-10: qty 2
  Filled 2015-06-10: qty 1
  Filled 2015-06-10: qty 2
  Filled 2015-06-10 (×2): qty 1

## 2015-06-10 NOTE — Progress Notes (Signed)
Initial Nutrition Assessment       INTERVENTION:  Nutrition Supplement Therapy: Continue to send magic cup and ensure for added nutrition.  Family bringing in butter pecan ensure as well as pt likes that flavor Meals and snacks: Called Dr Volanda Napoleon and agreeable to liberalizing diet to regular at this time secondary to poor po intake   NUTRITION DIAGNOSIS:   Inadequate oral intake related to acute illness as evidenced by per patient/family report.    GOAL:   Patient will meet greater than or equal to 90% of their needs    MONITOR:    (Energy intake, Glucose profile, Electrolyte and renal profile)  REASON FOR ASSESSMENT:   Malnutrition Screening Tool    ASSESSMENT:      Pt admitted with weakness, poor po intake, pneumonia  Past Medical History  Diagnosis Date  . History of breast cancer   . Dementia   . Diabetes mellitus without complication   . Hypertension   . Cancer     Current Nutrition: ate 1 chicken nugget family brought in today, ate some of magic cup and frosty  Food/Nutrition-Related History: poor po intake for the past week prior to admission per family, only eating bites   Medications: NS at 142ml/hr, calcium carbonate, aspart  Electrolyte/Renal Profile and Glucose Profile:   Recent Labs Lab 06/08/15 1224 06/09/15 0604 06/10/15 0527  NA 133* 134* 134*  K 4.5 4.1 4.0  CL 96* 101 102  CO2 26 23 25   BUN 36* 22* 19  CREATININE 1.52* 1.14* 1.31*  CALCIUM 10.0 9.1 9.1  GLUCOSE 231* 147* 122*   Protein Profile:  Recent Labs Lab 06/08/15 1224  ALBUMIN 3.6   Last BM:8/7   Nutrition-Focused Physical Exam Findings: Nutrition-Focused physical exam completed. Findings are WDL for fat depletion, muscle depletion, and edema.   Has gotten progressively weaker per family over the last few days prior to admission    Weight Change: stable weight for the last several months per family     Diet Order:  Diet Heart Room service appropriate?:  Yes; Fluid consistency:: Thin  Skin:  Reviewed, no issues   Height:   Ht Readings from Last 1 Encounters:  06/08/15 5\' 8"  (1.727 m)    Weight:   Wt Readings from Last 1 Encounters:  06/08/15 162 lb (73.483 kg)     BMI:  Body mass index is 24.64 kg/(m^2).  Estimated Nutritional Needs:   Kcal:  BEE 1263 kcals (IF 1.0-1.2, AF 1.3) 1610-9604 kcals/d.   Protein:  (1.2-1.5 g/d) 89-111 g/d  Fluid:  (30-31ml/kg) 2220-2578ml/d  EDUCATION NEEDS:   No education needs identified at this time  [place]. Zenia Resides, [place], [place] (pager)

## 2015-06-10 NOTE — Telephone Encounter (Signed)
Patient's daughter [name] was notified of results. Patient is now in [facility] with pneumonia and high fever of unknown origins.

## 2015-06-10 NOTE — Progress Notes (Signed)
[place] [facility] [place] NAME: Briana Austin    MR#:  [id number]  DATE OF BIRTH:  [birth year]  SUBJECTIVE:  CHIEF COMPLAINT:   Chief Complaint  Patient presents with  . Weakness   Febrile again overnight to 103. No specific complaints other than headache. Denies shortness of breath.  REVIEW OF SYSTEMS:   Review of Systems  Constitutional: Negative for fever.  Respiratory: Negative for shortness of breath.   Cardiovascular: Negative for chest pain and palpitations.  Gastrointestinal: Negative for nausea, vomiting and abdominal pain.  Genitourinary: Negative for dysuria.    DRUG ALLERGIES:  No Known Allergies  VITALS:  Blood pressure 123/60, pulse 69, temperature 99.9 F (37.7 C), temperature source Oral, resp. rate 17, height 5\' 8"  (1.727 m), weight 73.483 kg (162 lb), SpO2 98 %.  PHYSICAL EXAMINATION:  GENERAL:  81 y.o.-year-old patient , laying in bed, no distress LUNGS: Normal breath sounds bilaterally, no wheezing, rales, rhonchi or crepitation. No use of accessory muscles of respiration.  CARDIOVASCULAR: S1, S2 normal. No murmurs, rubs, or gallops.  ABDOMEN: Soft, nontender, nondistended. Bowel sounds present. No organomegaly or mass.  EXTREMITIES: No pedal edema, cyanosis, or clubbing.  NEUROLOGIC: Cranial nerves II through XII are intact. Muscle strength 5/5 in all extremities. Sensation intact. Gait not checked.  PSYCHIATRIC: The patient is alert and oriented to person and place, not to date SKIN: No obvious rash, lesion, or ulcer.    LABORATORY PANEL:   CBC  Recent Labs Lab 06/10/15 0527  WBC 7.9  HGB 8.9*  HCT 26.1*  PLT 179   ------------------------------------------------------------------------------------------------------------------  Chemistries   Recent Labs Lab 06/08/15 1224  06/10/15 0527  NA 133*  < > 134*  K 4.5  < > 4.0  CL 96*  < > 102  CO2 26  < > 25  GLUCOSE 231*  < > 122*  BUN  36*  < > 19  CREATININE 1.52*  < > 1.31*  CALCIUM 10.0  < > 9.1  AST 32  --   --   ALT 21  --   --   ALKPHOS 58  --   --   BILITOT 0.5  --   --   < > = values in this interval not displayed. ------------------------------------------------------------------------------------------------------------------  Cardiac Enzymes  Recent Labs Lab 06/08/15 1224  TROPONINI <0.03   ------------------------------------------------------------------------------------------------------------------  RADIOLOGY:  Ct Chest W Contrast  06/09/2015   CLINICAL DATA:  Pt with dementia and unable to give any hx. Per MD note in chart pt "with a known history of breast cancer, hypertension, diabetes, hyperlipidemia and dementia presents to the emergency room with complaints of feeling very weak and tired for the past few days. She was seen by her primary care provider and was started on Augmentin at that time also was noted to have fever of 102. Patient presents to the ED today complaining of similar type of symptoms. She is noted to have a temperature 102.7 here. She underwent a chest x-ray which did not show any abnormality her urinalysis was negative. Her WBC count is normal as well. Patient also endorses weight loss. Over the past few months. She has some dry cough but according the family slight clearing her throat from time to time. There is no production to the cough. No chest pains palpitations no shortness of breath. No nausea vomiting or diarrhea. Denies any urinary frequency urgency or hesitancy."  EXAM: CT CHEST, ABDOMEN, AND PELVIS WITH CONTRAST  TECHNIQUE: Multidetector CT imaging of the chest, abdomen and pelvis was performed following the standard protocol during bolus administration of intravenous contrast.  CONTRAST:  46mL OMNIPAQUE IOHEXOL 300 MG/ML  SOLN  COMPARISON:  Chest radiograph 06/07/2015.  FINDINGS: CT CHEST FINDINGS  Thoracic inlet: Right thyroid nodules largest measuring 12 mm. No neck base or  axillary masses or adenopathy.  Mediastinum and hila: Heart mildly enlarged. There are dense coronary artery calcifications. Great vessels normal in caliber for age. Atherosclerotic calcifications along the aortic arch, descending thoracic aorta and aortic arch branch vessels. No mediastinal or hilar masses or pathologically enlarged lymph nodes. Nonspecific mild dilation of distal esophagus. No evidence of an esophageal mass.  Lungs and pleura: Patchy consolidation is noted in the posterior right lower lobe consistent with pneumonia. There is mild subsegmental atelectasis in both posterior lung bases. No lung mass or suspicious nodule. No pulmonary edema. No pleural effusion or pneumothorax.  CT ABDOMEN AND PELVIS FINDINGS  Liver, spleen, gallbladder, pancreas, adrenal glands:  Unremarkable.  Kidneys, ureters, bladder: Several small low-density renal lesions consistent with cysts. No stones. No hydronephrosis. Ureters normal in course and in caliber. Bladder is unremarkable.  Uterus and adnexa:  Uterus surgically absent.  No adnexal masses.  Lymph nodes:  No enlarged lymph nodes.  Ascites: None.  Vascular: Dense atherosclerotic calcifications along a mildly ectatic abdominal aorta and its branch vessels. No aneurysm.  Gastrointestinal: There is mild wall thickening along the mid sigmoid colon over a short segment with mild adjacent fat stranding. Although this may be chronic, minimal uncomplicated diverticulitis should be suspected if there are consistent clinical symptoms. There are multiple other colonic diverticula with no other evidence of inflammation. There is no bowel dilation. Small bowel is unremarkable. No appendix visualized.  MUSCULOSKELETAL FINDINGS  No osteoblastic or osteolytic lesions. There are only mild degenerative changes throughout visualized spine.  IMPRESSION: 1. Right lower lobe pneumonia. 2. Small area of wall thickening and mild adjacent inflammatory stranding of the sigmoid colon. This  may be chronic or reflect mild uncomplicated diverticulitis. 3. No other evidence of an acute abnormality within the chest, abdomen or pelvis.   Electronically Signed   By: Lajean Manes M.D.   On: 06/09/2015 09:24   Ct Abdomen Pelvis W Contrast  06/09/2015   CLINICAL DATA:  Pt with dementia and unable to give any hx. Per MD note in chart pt "with a known history of breast cancer, hypertension, diabetes, hyperlipidemia and dementia presents to the emergency room with complaints of feeling very weak and tired for the past few days. She was seen by her primary care provider and was started on Augmentin at that time also was noted to have fever of 102. Patient presents to the ED today complaining of similar type of symptoms. She is noted to have a temperature 102.7 here. She underwent a chest x-ray which did not show any abnormality her urinalysis was negative. Her WBC count is normal as well. Patient also endorses weight loss. Over the past few months. She has some dry cough but according the family slight clearing her throat from time to time. There is no production to the cough. No chest pains palpitations no shortness of breath. No nausea vomiting or diarrhea. Denies any urinary frequency urgency or hesitancy."  EXAM: CT CHEST, ABDOMEN, AND PELVIS WITH CONTRAST  TECHNIQUE: Multidetector CT imaging of the chest, abdomen and pelvis was performed following the standard protocol during bolus administration of intravenous contrast.  CONTRAST:  39mL OMNIPAQUE IOHEXOL  300 MG/ML  SOLN  COMPARISON:  Chest radiograph 06/07/2015.  FINDINGS: CT CHEST FINDINGS  Thoracic inlet: Right thyroid nodules largest measuring 12 mm. No neck base or axillary masses or adenopathy.  Mediastinum and hila: Heart mildly enlarged. There are dense coronary artery calcifications. Great vessels normal in caliber for age. Atherosclerotic calcifications along the aortic arch, descending thoracic aorta and aortic arch branch vessels. No mediastinal  or hilar masses or pathologically enlarged lymph nodes. Nonspecific mild dilation of distal esophagus. No evidence of an esophageal mass.  Lungs and pleura: Patchy consolidation is noted in the posterior right lower lobe consistent with pneumonia. There is mild subsegmental atelectasis in both posterior lung bases. No lung mass or suspicious nodule. No pulmonary edema. No pleural effusion or pneumothorax.  CT ABDOMEN AND PELVIS FINDINGS  Liver, spleen, gallbladder, pancreas, adrenal glands:  Unremarkable.  Kidneys, ureters, bladder: Several small low-density renal lesions consistent with cysts. No stones. No hydronephrosis. Ureters normal in course and in caliber. Bladder is unremarkable.  Uterus and adnexa:  Uterus surgically absent.  No adnexal masses.  Lymph nodes:  No enlarged lymph nodes.  Ascites: None.  Vascular: Dense atherosclerotic calcifications along a mildly ectatic abdominal aorta and its branch vessels. No aneurysm.  Gastrointestinal: There is mild wall thickening along the mid sigmoid colon over a short segment with mild adjacent fat stranding. Although this may be chronic, minimal uncomplicated diverticulitis should be suspected if there are consistent clinical symptoms. There are multiple other colonic diverticula with no other evidence of inflammation. There is no bowel dilation. Small bowel is unremarkable. No appendix visualized.  MUSCULOSKELETAL FINDINGS  No osteoblastic or osteolytic lesions. There are only mild degenerative changes throughout visualized spine.  IMPRESSION: 1. Right lower lobe pneumonia. 2. Small area of wall thickening and mild adjacent inflammatory stranding of the sigmoid colon. This may be chronic or reflect mild uncomplicated diverticulitis. 3. No other evidence of an acute abnormality within the chest, abdomen or pelvis.   Electronically Signed   By: Lajean Manes M.D.   On: 06/09/2015 09:24    EKG:   Orders placed or performed during the hospital encounter of  06/08/15  . EKG 12-Lead  . EKG 12-Lead  . EKG 12-Lead  . EKG 12-Lead    ASSESSMENT AND PLAN:   #1 community-acquired pneumonia:  -  Chest CT shows right lower lobe pneumonia.  - Blood cultures negative - Speech evaluation negative for signs of aspiration - We'll switch to Augmentin which would cover both the pneumonia and an abdominal process  #2 possible diverticulitis seen on abdominal CT - Blood cultures negative, denies abdominal pain vomiting or diarrhea but does have a significantly decreased appetite - Start Augmentin  #3 diabetes mellitus: - CBG's well controlled - Continue metformin and sliding scale, holding glipizide will inpatient  #4 hypertension - Blood pressure is controlled - Continue lisinopril, hydrochlorothiazide, metoprolol  #5 hyperlipidemia: Continue statin therapy  #6 dementia: Continue Aricept. Family very supportive. We'll get physical therapy evaluation   CODE STATUS: Full  TOTAL TIME TAKING CARE OF THIS PATIENT: 35 minutes.  Greater than 50% of time spent in care coordination and counseling. Spoke with the family husband and daughter at the bedside. POSSIBLE D/C IN 1-2 DAYS, DEPENDING ON CLINICAL CONDITION.   Myrtis Ser M.D on 06/10/2015 at 11:15 AM  Between 7am to 6pm - Pager - [phone]  After 6pm go to [email or web address] - password [email or web address] [facility]  [place] [facility]  Office  [phone]  CC: Primary care  physician; Lelon Huh, MD

## 2015-06-10 NOTE — Progress Notes (Signed)
PT Cancellation Note  Patient Details Name: Briana Austin MRN: [id number] DOB: [birth year]   Cancelled Treatment:    Reason Eval/Treat Not Completed: Medical issues which prohibited therapy (103.4 degree fever this afternoon as PT arrived).  Will try again tomorrow.   Ramond Dial 06/10/2015, 3:56 PM   Mee Hives, PT MS Acute Rehab Dept. Number: [facility] [id number] and [place] [phone]

## 2015-06-10 NOTE — Telephone Encounter (Signed)
-----   Message from Birdie Sons, MD sent at 06/07/2015  1:41 PM EDT ----- No pneumonia, but some signs of bronchitis. Have sent rx for amoxicillin to [facility] [address]. Call if not much better on Monday.

## 2015-06-10 NOTE — Evaluation (Signed)
Speech Language Pathology Evaluation Patient Details Name: Briana Austin MRN: [id number] DOB: Jan 01, 1934 Today's Date: 06/10/2015 Time:  - 0915    Problem List:  Patient Active Problem List   Diagnosis Date Noted  . Fever 06/08/2015  . Abnormal loss of weight 03/25/2015  . Decreased appetite 03/25/2015  . Dementia 03/25/2015  . Diabetes mellitus, type II 03/25/2015  . Hyperlipidemia 03/25/2015  . Hypertension 03/25/2015  . Normocytic anemia 03/25/2015  . Physical deconditioning 03/25/2015  . Vitamin D deficiency 11/17/2010   Past Medical History:  Past Medical History  Diagnosis Date  . History of breast cancer   . Dementia   . Diabetes mellitus without complication   . Hypertension   . Cancer    Past Surgical History:  Past Surgical History  Procedure Laterality Date  . Sigmoid polyp excision  11/24/2004    Hyperplastic polyp  . Breast lumpectomy Right 2005    Carcinoma in situ of right breast  . Total abdominal hysterectomy w/ bilateral salpingoophorectomy  1981    Benign   HPI:    Larna Capelle is a 81 y.o. female with a known history of breast cancer,, hypertension, diabetes, hyperlipidemia and dementia presents to the emergency room with complaints of feeling very weak and tired for the past few days. She was seen by her primary care provider and was started on Augmentin at that time also was noted to have fever of 102. Patient presented to the ED complaining of similar type of symptoms  Assessment / Plan / Recommendation Clinical Impression  Pt presents with functional swallowing abilities at bedside with no overt s/s of aspiration noted today. Family reports poor appetite over the past week in addition to Pt often chewing and spitting out solids or gagging with foods. Family has been feeding Pt since hospitalization because of IV in the right arm. Oral  motor strength and coordination was adequate for all consistencis with no apparent weakness. Oral phase was  adequate for solid crackers with min to no oral residue after the swallow. Pt tolerated thins via multiple sips from a straw, applesauce, and solid crackers with no s/s of aspiration.  Vocal quality remained clear, laryngeal elevation appeared adequate. Rec continueing a reg, heart healthy diet. Will encourage self feeding and fully upright posistioning for meals. Will add magic cup to meals as Pt likes sweets per family. SLP to follow up with toleration of diet and adjust as needed.    SLP Assessment   No apparent dysphagia today    Follow Up Recommendations   F/u 1-3 days    Frequency and Duration min 2x/week      Pertinent Vitals/Pain  N/a   SLP Goals  Patient/Family Stated Goal: Pt will tolerate least restrictivwe diet considered safe without s/s of aspiration  SLP Evaluation Prior Functioning   Poor appetite over apt 2 weeks   Cognition  Orientation Level: Disoriented to situation;Disoriented to time    Comprehension   Dementia at baseline but able to follow directions   Expression  WFL   Oral / Motor Oral Motor/Sensory Function Overall Oral Motor/Sensory Function: Appears within functional limits for tasks assessed Labial ROM: Within Functional Limits Labial Symmetry: Within Functional Limits Labial Strength: Within Functional Limits Labial Sensation: Within Functional Limits Lingual ROM: Within Functional Limits Lingual Symmetry: Within Functional Limits Lingual Strength: Within Functional Limits Lingual Sensation: Within Functional Limits Facial ROM: Within Functional Limits Facial Symmetry: Within Functional Limits Facial Strength: Within Functional Limits Facial Sensation: Within Functional Limits Mandible:  Within Functional Limits   GO     Lucila Maine 06/10/2015, 10:08 AM

## 2015-06-11 LAB — CBC
HEMATOCRIT: 27 % — AB (ref 35.0–47.0)
Hemoglobin: 9 g/dL — ABNORMAL LOW (ref 12.0–16.0)
MCH: 31 pg (ref 26.0–34.0)
MCHC: 33.4 g/dL (ref 32.0–36.0)
MCV: 93 fL (ref 80.0–100.0)
Platelets: 187 10*3/uL (ref 150–440)
RBC: 2.91 MIL/uL — ABNORMAL LOW (ref 3.80–5.20)
RDW: 13.5 % (ref 11.5–14.5)
WBC: 6.5 10*3/uL (ref 3.6–11.0)

## 2015-06-11 LAB — BASIC METABOLIC PANEL
Anion gap: 10 (ref 5–15)
BUN: 15 mg/dL (ref 6–20)
CALCIUM: 9.5 mg/dL (ref 8.9–10.3)
CHLORIDE: 100 mmol/L — AB (ref 101–111)
CO2: 23 mmol/L (ref 22–32)
Creatinine, Ser: 1.13 mg/dL — ABNORMAL HIGH (ref 0.44–1.00)
GFR calc Af Amer: 52 mL/min — ABNORMAL LOW (ref >60)
GFR calc non Af Amer: 45 mL/min — ABNORMAL LOW (ref >60)
Glucose, Bld: 115 mg/dL — ABNORMAL HIGH (ref 65–99)
Potassium: 3.6 mmol/L (ref 3.5–5.1)
Sodium: 133 mmol/L — ABNORMAL LOW (ref 135–145)

## 2015-06-11 LAB — GLUCOSE, CAPILLARY
GLUCOSE-CAPILLARY: 103 mg/dL — AB (ref 65–99)
GLUCOSE-CAPILLARY: 177 mg/dL — AB (ref 65–99)
GLUCOSE-CAPILLARY: 150 mg/dL — AB (ref 65–99)
Glucose-Capillary: 150 mg/dL — ABNORMAL HIGH (ref 65–99)

## 2015-06-11 MED ORDER — RISAQUAD PO CAPS
1.0000 | ORAL_CAPSULE | Freq: Two times a day (BID) | ORAL | Status: DC
  Administered 2015-06-11 – 2015-06-14 (×6): 1 via ORAL
  Filled 2015-06-11 (×7): qty 1

## 2015-06-11 NOTE — Evaluation (Signed)
Physical Therapy Evaluation Patient Details Name: Briana Austin MRN: [id number] DOB: [birth year] Today's Date: 06/11/2015   History of Present Illness  Pt is a 81 y.o. female presenting with weakness and 102.7 temperature.  Pt admitted with community-acquired PNA (R LL PNA) and possible diverticulitis seen on abdominal CT.  Clinical Impression  Currently pt demonstrates impairments with general weakness, balance, and limitations with functional mobility.  Prior to admission, pt was ambulating without AD (pt's family reports pt would occasionally lose her balance and bump into a wall but never fell).  Pt lives with her husband in 1 level home with 1 step to enter.  Currently pt is CGA to min assist with functional mobility using RW (pt initially unsteady but with increased distance pt's steadiness improved and pt then CGA).  Pt would benefit from skilled PT to address above noted impairments and functional limitations.  Recommend pt discharge to home with 24/7 assist (pt's daughter reports she will provide this) when medically appropriate with HHPT.     Follow Up Recommendations Home health PT;Supervision/Assistance - 24 hour (Pt's daughter reports being able to provide this)    Equipment Recommendations   (Pt's family to bring in home RW to see if it will work for pt)    Recommendations for Other Services       Precautions / Restrictions Precautions Precautions: Fall Restrictions Weight Bearing Restrictions: No      Mobility  Bed Mobility Overal bed mobility: Needs Assistance Bed Mobility: Supine to Sit     Supine to sit: Min assist;HOB elevated     General bed mobility comments: increased time to perform on own; assist for sitting balance d/t posterior lean  Transfers Overall transfer level: Needs assistance Equipment used: Rolling walker (2 wheeled) Transfers: Sit to/from [facility] Sit to Stand: Min guard;Min assist Stand pivot transfers: Min guard;Min  assist (transfer to bathroom toilet)          Ambulation/Gait Ambulation/Gait assistance: Min guard;Min assist Ambulation Distance (Feet): 200 Feet Assistive device: Rolling walker (2 wheeled)       General Gait Details: pt initially unsteady requiring occasional min assist to steady but with distance improved to CGA; decreased cadence; decreased B step length/foot clearance/heelstrike  Stairs            Wheelchair Mobility    Modified Rankin (Stroke Patients Only)       Balance Overall balance assessment: Needs assistance Sitting-balance support: No upper extremity supported;Feet unsupported Sitting balance-Leahy Scale: Fair Sitting balance - Comments: posterior lean when feet not supported Postural control: Posterior lean Standing balance support: Bilateral upper extremity supported Standing balance-Leahy Scale: Fair (improved with activity though to good status)                               Pertinent Vitals/Pain Pain Assessment: No/denies pain  Vitals stable and WFL throughout treatment session.    Home Living Family/patient expects to be discharged to:: Private residence Living Arrangements: Spouse/significant other Available Help at Discharge: Family (Pt's daughter plans to stay with pt and pt's husband until pt's functional mobility/assist levels improves to a safe status)   Home Access: Stairs to enter Entrance Stairs-Rails: None Entrance Stairs-Number of Steps: 1 Home Layout: One level Home Equipment:  (pt's family reports having RW at home and will bring it in to make sure it will work for pt)      Prior Function Level of Independence: Needs assistance  Gait / Transfers Assistance Needed: pt has needed assist in last week d/t overall weakness but otherwise pt and pt's family denies any falls in past 6 months           Hand Dominance        Extremity/Trunk Assessment   Upper Extremity Assessment: Generalized weakness            Lower Extremity Assessment: Generalized weakness         Communication   Communication: No difficulties  Cognition Arousal/Alertness: Awake/alert Behavior During Therapy: Flat affect Overall Cognitive Status: History of cognitive impairments - at baseline                      General Comments   Nursing cleared pt for participation in physical therapy.  Pt agreeable to PT session.  Pt's daughter, granddaughter, and husband present during session.    Exercises   Treatment:  Performed semi-supine B LE therapeutic exercise x 10 reps:  Ankle pumps (AROM B LE's); quad sets x3 second holds (AROM B LE's); glute squeezes x3 second holds (AROM B); SAQ's (AROM R; AROM L); heelslides (AROM R; AROM L), hip abd/adduction (AROM R; AROM L).  Pt required vc's and tactile cues for correct technique with exercises.       Assessment/Plan    PT Assessment Patient needs continued PT services  PT Diagnosis Generalized weakness   PT Problem List Decreased strength;Decreased activity tolerance;Decreased balance;Decreased mobility  PT Treatment Interventions DME instruction;Gait training;Stair training;Functional mobility training;Therapeutic activities;Therapeutic exercise;Balance training;Patient/family education   PT Goals (Current goals can be found in the Care Plan section) Acute Rehab PT Goals Patient Stated Goal: To walk more PT Goal Formulation: With patient/family Time For Goal Achievement: 06/25/15 Potential to Achieve Goals: Good    Frequency Min 2X/week   Barriers to discharge        Co-evaluation               End of Session Equipment Utilized During Treatment: Gait belt Activity Tolerance: Patient tolerated treatment well Patient left: in chair;with call bell/phone within reach;with chair alarm set;with family/visitor present Nurse Communication: Mobility status         Time: 4356-8616 PT Time Calculation (min) (ACUTE ONLY): 51 min   Charges:   PT  Evaluation $Initial PT Evaluation Tier I: 1 Procedure PT Treatments $Therapeutic Exercise: 8-22 mins $Therapeutic Activity: 8-22 mins   PT G CodesLeitha Bleak 07-04-15, 2:57 PM Leitha Bleak, [place]

## 2015-06-11 NOTE — Progress Notes (Signed)
[facility] [place] NAME: Briana Austin    MR#:  [id number]  DATE OF BIRTH:  Feb 18, 1934  SUBJECTIVE:  CHIEF COMPLAINT:   Chief Complaint  Patient presents with  . Weakness   Fever trend deceasing, energy level and appetitive improving.  REVIEW OF SYSTEMS:   Review of Systems  Constitutional: Negative for fever.  Respiratory: Negative for shortness of breath.   Cardiovascular: Negative for chest pain and palpitations.  Gastrointestinal: Negative for nausea, vomiting and abdominal pain.  Genitourinary: Negative for dysuria.    DRUG ALLERGIES:  No Known Allergies  VITALS:  Blood pressure 123/60, pulse 72, temperature 100 F (37.8 C), temperature source Axillary, resp. rate 17, height 5\' 8"  (1.727 m), weight 73.483 kg (162 lb), SpO2 99 %.  PHYSICAL EXAMINATION:  GENERAL:  81 y.o.-year-old patient , laying in bed, no distress LUNGS: Normal breath sounds bilaterally, no wheezing, rales, rhonchi or crepitation. No use of accessory muscles of respiration.  CARDIOVASCULAR: S1, S2 normal. No murmurs, rubs, or gallops.  ABDOMEN: Soft, nontender, nondistended. Bowel sounds present. No organomegaly or mass.  EXTREMITIES: No pedal edema, cyanosis, or clubbing.  NEUROLOGIC: Cranial nerves II through XII are intact. Muscle strength 5/5 in all extremities. Sensation intact. Gait not checked.  PSYCHIATRIC: The patient is alert and oriented to person and place, not to date SKIN: No obvious rash, lesion, or ulcer.    LABORATORY PANEL:   CBC  Recent Labs Lab 06/11/15 0508  WBC 6.5  HGB 9.0*  HCT 27.0*  PLT 187   ------------------------------------------------------------------------------------------------------------------  Chemistries   Recent Labs Lab 06/08/15 1224  06/11/15 0508  NA 133*  < > 133*  K 4.5  < > 3.6  CL 96*  < > 100*  CO2 26  < > 23  GLUCOSE 231*  < > 115*  BUN 36*  < > 15  CREATININE 1.52*  < >  1.13*  CALCIUM 10.0  < > 9.5  AST 32  --   --   ALT 21  --   --   ALKPHOS 58  --   --   BILITOT 0.5  --   --   < > = values in this interval not displayed. ------------------------------------------------------------------------------------------------------------------  Cardiac Enzymes  Recent Labs Lab 06/08/15 1224  TROPONINI <0.03   ------------------------------------------------------------------------------------------------------------------  RADIOLOGY:  No results found.  EKG:   Orders placed or performed during the hospital encounter of 06/08/15  . EKG 12-Lead  . EKG 12-Lead  . EKG 12-Lead  . EKG 12-Lead    ASSESSMENT AND PLAN:   #1 community-acquired pneumonia:  -  Chest CT shows right lower lobe pneumonia.  - Blood cultures negative - Speech evaluation negative for signs of aspiration - We'll switch to Augmentin which would cover both the pneumonia and an abdominal process  #2 possible diverticulitis seen on abdominal CT - Blood cultures negative, denies abdominal pain vomiting or diarrhea but does have a significantly decreased appetite - Start Augmentin  #3 diabetes mellitus: - CBG's well controlled - Continue metformin and sliding scale, holding glipizide will inpatient  #4 hypertension - Blood pressure is controlled - Continue lisinopril, hydrochlorothiazide, metoprolol  #5 hyperlipidemia: Continue statin therapy  #6 dementia: Continue Aricept. Family very supportive. We'll get physical therapy evaluation  #7 increased oral intake: Appreciate nutrition consultation. Continue supplementation. I think that her appetite is improving with treatment of infection.   CODE STATUS: Full  TOTAL TIME TAKING CARE OF THIS PATIENT:  35 minutes.  Greater than 50% of time spent in care coordination and counseling. Spoke with the family husband and daughter at the bedside. POSSIBLE D/C IN 1-2 DAYS, DEPENDING ON CLINICAL CONDITION.   Myrtis Ser M.D on  06/11/2015 at 3:50 PM  Between 7am to 6pm - Pager - [phone]  After 6pm go to [email or web address] - password [email or web address] [facility]  Office  [phone]  CC: Primary care physician; Lelon Huh, MD

## 2015-06-12 ENCOUNTER — Inpatient Hospital Stay: Payer: Commercial Managed Care - HMO

## 2015-06-12 LAB — BASIC METABOLIC PANEL
ANION GAP: 11 (ref 5–15)
BUN: 17 mg/dL (ref 6–20)
CO2: 24 mmol/L (ref 22–32)
Calcium: 9.9 mg/dL (ref 8.9–10.3)
Chloride: 99 mmol/L — ABNORMAL LOW (ref 101–111)
Creatinine, Ser: 1.06 mg/dL — ABNORMAL HIGH (ref 0.44–1.00)
GFR calc non Af Amer: 49 mL/min — ABNORMAL LOW (ref >60)
GFR, EST AFRICAN AMERICAN: 56 mL/min — AB (ref >60)
Glucose, Bld: 120 mg/dL — ABNORMAL HIGH (ref 65–99)
POTASSIUM: 3.6 mmol/L (ref 3.5–5.1)
SODIUM: 134 mmol/L — AB (ref 135–145)

## 2015-06-12 LAB — GLUCOSE, CAPILLARY
GLUCOSE-CAPILLARY: 115 mg/dL — AB (ref 65–99)
GLUCOSE-CAPILLARY: 152 mg/dL — AB (ref 65–99)
GLUCOSE-CAPILLARY: 162 mg/dL — AB (ref 65–99)
Glucose-Capillary: 233 mg/dL — ABNORMAL HIGH (ref 65–99)

## 2015-06-12 LAB — DIFFERENTIAL
BASOS ABS: 0.1 10*3/uL (ref 0–0.1)
Basophils Relative: 1 %
EOS ABS: 0.5 10*3/uL (ref 0–0.7)
EOS PCT: 7 %
Lymphocytes Relative: 19 %
Lymphs Abs: 1.3 10*3/uL (ref 1.0–3.6)
MONO ABS: 0.8 10*3/uL (ref 0.2–0.9)
Monocytes Relative: 12 %
Neutro Abs: 4.2 10*3/uL (ref 1.4–6.5)
Neutrophils Relative %: 61 %

## 2015-06-12 LAB — CBC
HEMATOCRIT: 28.7 % — AB (ref 35.0–47.0)
HEMOGLOBIN: 9.6 g/dL — AB (ref 12.0–16.0)
MCH: 31 pg (ref 26.0–34.0)
MCHC: 33.5 g/dL (ref 32.0–36.0)
MCV: 92.6 fL (ref 80.0–100.0)
PLATELETS: 225 10*3/uL (ref 150–440)
RBC: 3.1 MIL/uL — ABNORMAL LOW (ref 3.80–5.20)
RDW: 13.5 % (ref 11.5–14.5)
WBC: 5.5 10*3/uL (ref 3.6–11.0)

## 2015-06-12 MED ORDER — PIPERACILLIN-TAZOBACTAM 4.5 G IVPB
4.5000 g | Freq: Three times a day (TID) | INTRAVENOUS | Status: DC
  Administered 2015-06-12 – 2015-06-13 (×3): 4.5 g via INTRAVENOUS
  Filled 2015-06-12 (×7): qty 100

## 2015-06-12 MED ORDER — TRAMADOL HCL 50 MG PO TABS: 50.0000 mg | ORAL_TABLET | Freq: Four times a day (QID) | ORAL | Status: DC | PRN

## 2015-06-12 NOTE — Progress Notes (Addendum)
[facility] [place] NAME: Briana Austin    MR#:  [id number]  DATE OF BIRTH:  03/01/1934  SUBJECTIVE:  CHIEF COMPLAINT:   Chief Complaint  Patient presents with  . Weakness   Much better this morning, sitting up, eating well, energetic and conversant.  REVIEW OF SYSTEMS:   Review of Systems  Constitutional: Negative for fever.  Respiratory: Negative for shortness of breath.   Cardiovascular: Negative for chest pain and palpitations.  Gastrointestinal: Negative for nausea, vomiting and abdominal pain.  Genitourinary: Negative for dysuria.    DRUG ALLERGIES:  No Known Allergies  VITALS:  Blood pressure 114/63, pulse 80, temperature 98.3 F (36.8 C), temperature source Oral, resp. rate 17, height 5\' 8"  (1.727 m), weight 73.483 kg (162 lb), SpO2 95 %.  PHYSICAL EXAMINATION:  GENERAL:  81 y.o.-year-old patient , sitting up eating breakfast LUNGS: Normal breath sounds bilaterally, no wheezing, rales, rhonchi or crepitation. No use of accessory muscles of respiration.  CARDIOVASCULAR: S1, S2 normal. No murmurs, rubs, or gallops.  ABDOMEN: Soft, nontender, nondistended. Bowel sounds present. No organomegaly or mass.  EXTREMITIES: No pedal edema, cyanosis, or clubbing.  NEUROLOGIC: Cranial nerves II through XII are intact. Muscle strength 5/5 in all extremities. Sensation intact. Gait not checked.  PSYCHIATRIC: The patient is alert and oriented to person and place, not to date SKIN: No obvious rash, lesion, or ulcer.    LABORATORY PANEL:   CBC  Recent Labs Lab 06/12/15 0458  WBC 5.5  HGB 9.6*  HCT 28.7*  PLT 225   ------------------------------------------------------------------------------------------------------------------  Chemistries   Recent Labs Lab 06/08/15 1224  06/12/15 0458  NA 133*  < > 134*  K 4.5  < > 3.6  CL 96*  < > 99*  CO2 26  < > 24  GLUCOSE 231*  < > 120*  BUN 36*  < > 17  CREATININE  1.52*  < > 1.06*  CALCIUM 10.0  < > 9.9  AST 32  --   --   ALT 21  --   --   ALKPHOS 58  --   --   BILITOT 0.5  --   --   < > = values in this interval not displayed. ------------------------------------------------------------------------------------------------------------------  Cardiac Enzymes  Recent Labs Lab 06/08/15 1224  TROPONINI <0.03   ------------------------------------------------------------------------------------------------------------------  RADIOLOGY:  US Venous Img Lower Bilateral  06/12/2015   CLINICAL DATA:  Fever  EXAM: BILATERAL LOWER EXTREMITY VENOUS DOPPLER ULTRASOUND  TECHNIQUE: Gray-scale sonography with graded compression, as well as color Doppler and duplex ultrasound were performed to evaluate the lower extremity deep venous systems from the level of the common femoral vein and including the common femoral, femoral, profunda femoral, popliteal and calf veins including the posterior tibial, peroneal and gastrocnemius veins when visible. The superficial great saphenous vein was also interrogated. Spectral Doppler was utilized to evaluate flow at rest and with distal augmentation maneuvers in the common femoral, femoral and popliteal veins.  COMPARISON:  None.  FINDINGS: RIGHT LOWER EXTREMITY  Common Femoral Vein: No evidence of thrombus. Normal compressibility, respiratory phasicity and response to augmentation.  Saphenofemoral Junction: No evidence of thrombus. Normal compressibility and flow on color Doppler imaging.  Profunda Femoral Vein: No evidence of thrombus. Normal compressibility and flow on color Doppler imaging.  Femoral Vein: No evidence of thrombus. Normal compressibility, respiratory phasicity and response to augmentation.  Popliteal Vein: No evidence of thrombus. Normal compressibility, respiratory phasicity and response to augmentation.  Calf Veins: Abnormal right peroneal vein which is noncompressible with echogenic clot.  Superficial Great  Saphenous Vein: No evidence of thrombus. Normal compressibility and flow on color Doppler imaging.  Venous Reflux:  None.  Other Findings:  None.  LEFT LOWER EXTREMITY  Common Femoral Vein: No evidence of thrombus. Normal compressibility, respiratory phasicity and response to augmentation.  Saphenofemoral Junction: No evidence of thrombus. Normal compressibility and flow on color Doppler imaging.  Profunda Femoral Vein: No evidence of thrombus. Normal compressibility and flow on color Doppler imaging.  Femoral Vein: No evidence of thrombus. Normal compressibility, respiratory phasicity and response to augmentation.  Popliteal Vein: No evidence of thrombus. Normal compressibility, respiratory phasicity and response to augmentation.  Calf Veins: No evidence of thrombus. Normal compressibility and flow on color Doppler imaging.  Superficial Great Saphenous Vein: No evidence of thrombus. Normal compressibility and flow on color Doppler imaging.  Venous Reflux:  None.  Other Findings: Left popliteal fossa cyst measuring 4.8 x 3.6 x 1.2 cm consistent with Baker cyst.  IMPRESSION: Isolated clot in the right peroneal vein. Otherwise no evidence of DVT  Left Baker cyst   Electronically Signed   By: Franchot Gallo M.D.   On: 06/12/2015 11:29    EKG:   Orders placed or performed during the hospital encounter of 06/08/15  . EKG 12-Lead  . EKG 12-Lead  . EKG 12-Lead  . EKG 12-Lead    ASSESSMENT AND PLAN:   #1 Fever - Admitted with temp 103 on 8/6. Have been using augmentin to treat possible CAP vs mild diverticulitis seen on admission CT scans.  Clinically doing much better, alert, eating, no complaints, more energetic.  Continues to have fevers up to 101.5 last night.  Blood cultures (-), urine (-) no skin findings, no specific complaints. No significant leucocytosis. ST states no aspiration. LE dopplers negative for DVT.  She has had a headache throughout the hospitalization. MRI? LP? ECHO? Malignancy work up?  Will obtain ID consultation.  #3 diabetes mellitus: - CBG's well controlled - Continue metformin and sliding scale, holding glipizide will inpatient  #4 hypertension - Blood pressure is controlled - Continue lisinopril, hydrochlorothiazide, metoprolol  #5 hyperlipidemia: Continue statin therapy  #6 dementia: Continue Aricept. Family very supportive. PT eval is for HHPT, 24 hour supervision  #7 increased oral intake: eating well today   CODE STATUS: Full  TOTAL TIME TAKING CARE OF THIS PATIENT: 35 minutes.  Greater than 50% of time spent in care coordination and counseling. Spoke with the family:  husband and daughter at the bedside. POSSIBLE D/C IN 1-2 DAYS, DEPENDING ON CLINICAL CONDITION.   Myrtis Ser M.D on 06/12/2015 at 12:56 PM  Between 7am to 6pm - Pager - [phone]  After 6pm go to [email or web address] - password [email or web address] [facility]  Office  [phone]  CC: Primary care physician; Lelon Huh, MD

## 2015-06-12 NOTE — Care Management Important Message (Signed)
Important Message  Patient Details  Name: Briana Austin MRN: [id number] Date of Birth: [birth year]   Medicare Important Message Given:  Yes-second notification given    Juliann Pulse A Allmond 06/12/2015, 10:58 AM

## 2015-06-12 NOTE — Care Management Note (Signed)
Case Management Note  Patient Details  Name: Briana Austin MRN: [id number] Date of Birth: 1933-12-04  Subjective/Objective:         Spoke with patient and adult daughter Briana Austin .    Patient is from home with adequate family support. Lives with husband and has support of adult children.  Has a rolling walker already. List of home health providers give to daughter and [facility] was chosen. No difficulty with medications , No home O2. Referal placed with Floyd[facility] Flock at [facility].  Anticipate discharge soon. No other needs identified.    Action/Plan:  Discharge to home with family and home health. Expected Discharge Date:  06/09/15               Expected Discharge Plan:  [place]  In-House Referral:     Discharge planning Services  CM Consult  Post Acute Care Choice:  Durable Medical Equipment Choice offered to:  Adult Children  DME Arranged:    DME Agency:  [facility] Arranged:    [place] Agency:  [facility]  Status of Service:  In process, will continue to follow  Medicare Important Message Given:    Date Medicare IM Given:    Medicare IM give by:    Date Additional Medicare IM Given:    Additional Medicare Important Message give by:     If discussed at [place] of Stay Meetings, dates discussed:    Additional Comments:  Alvie Heidelberg, RN 06/12/2015, 10:52 AM

## 2015-06-12 NOTE — Consult Note (Signed)
[facility]     Reason for Consult:fever, PNA    Referring Physician: Myrtis Ser Date of Admission:  06/08/2015   Active Problems:   Fever   HPI: Briana Austin is a 81 y.o. female history of breast cancer,, hypertension, diabetes, hyperlipidemia and dementia admitted 8/6 with cough and fever. She has been febrile since admission despite nml WBC. She has been on augmentin (receied ceftriaxone and azithro for 8/6-7). BCx have been negative.  CT chest showed RLL infiltrate and CT abd with possible sigmoid diverticulitis. Per discussion with the patient she reports that she did not really feel too bad prior to coming in but does feel a little better. She is coughing with some sputum. She has had a cough off on for over a month. Has had some decreased PO intake as well.  No travel, outdoor exposure, pets. No recent procedures.  Past Medical History  Diagnosis Date  . History of breast cancer   . Dementia   . Diabetes mellitus without complication   . Hypertension   . Cancer    Past Surgical History  Procedure Laterality Date  . Sigmoid polyp excision  11/24/2004    Hyperplastic polyp  . Breast lumpectomy Right 2005    Carcinoma in situ of right breast  . Total abdominal hysterectomy w/ bilateral salpingoophorectomy  1981    Benign   Social History  Substance Use Topics  . Smoking status: Former Smoker -- 0.50 packs/day for 8 years    Types: Cigarettes  . Smokeless tobacco: Never Used  . Alcohol Use: No   Family History  Problem Relation Age of Onset  . Stroke Mother   . Dementia Brother     Allergies: No Known Allergies  Current antibiotics: Antibiotics Given (last 72 hours)    Date/Time Action Medication Dose Rate   06/09/15 1530 Given   cefTRIAXone (ROCEPHIN) 1 g in dextrose 5 % 50 mL IVPB 1 g 100 mL/hr   06/09/15 1648 Given   azithromycin (ZITHROMAX) 500 mg in dextrose 5 % 250 mL IVPB 500 mg 250 mL/hr   06/10/15 1206 Given   amoxicillin-clavulanate (AUGMENTIN) 875-125 MG per tablet 1 tablet 1 tablet    06/10/15 2106 Given   amoxicillin-clavulanate (AUGMENTIN) 875-125 MG per tablet 1 tablet 1 tablet    06/11/15 0947 Given   amoxicillin-clavulanate (AUGMENTIN) 875-125 MG per tablet 1 tablet 1 tablet    06/11/15 2230 Given   amoxicillin-clavulanate (AUGMENTIN) 875-125 MG per tablet 1 tablet 1 tablet    06/12/15 0926 Given   amoxicillin-clavulanate (AUGMENTIN) 875-125 MG per tablet 1 tablet 1 tablet       MEDICATIONS: . acidophilus  1 capsule Oral BID  . amoxicillin-clavulanate  1 tablet Oral Q12H  . calcium carbonate  2.5 tablet Oral Q breakfast  . donepezil  5 mg Oral Daily  . enoxaparin (LOVENOX) injection  40 mg Subcutaneous Q24H  . feeding supplement (ENSURE ENLIVE)  237 mL Oral BID BM  . metoprolol tartrate  100 mg Oral Q1500   And  . hydrochlorothiazide  50 mg Oral Daily  . insulin aspart  0-9 Units Subcutaneous TID WC  . lisinopril  40 mg Oral Daily  . memantine  28 mg Oral Daily  . potassium chloride  10 mEq Oral Daily  . pravastatin  40 mg Oral Daily  . [START ON 06/13/2015] Vitamin D (Ergocalciferol)  50,000 Units Oral Weekly    Review of Systems - 11 systems reviewed and negative per HPI  OBJECTIVE: Temp:  [98.2 F (36.8 C)-101.5 F (38.6 C)] 98.3 F (36.8 C) (08/10 0835) Pulse Rate:  [63-80] 80 (08/10 0835) Resp:  [17-18] 17 (08/10 0835) BP: (114-123)/(60-63) 114/63 mmHg (08/10 0835) SpO2:  [95 %-99 %] 95 % (08/10 0835) Physical Exam  Constitutional:  Wake alert - sitting in chair  No distress.  HENT: [place]/AT, PERRLA, no scleral icterus Mouth/Throat: Oropharynx is clear and moist. No oropharyngeal exudate.  Cardiovascular: Normal rate, somewhat distant Pulmonary/Chest: Effort normal and crackles R base Neck - supple, no nuchal rigidity Abdominal: Soft. Bowel sounds are normal.  exhibits no distension. There is no tenderness.  Lymphadenopathy: no cervical adenopathy. No axillary  adenopathy Neurological: alert and oriented to person, place, and time.  Skin: Skin is warm and dry. No rash noted. No erythema.  Psychiatric: a normal mood and affect.  behavior is normal.    LABS: Results for orders placed or performed during the hospital encounter of 06/08/15 (from the past 48 hour(s))  Glucose, capillary     Status: Abnormal   Collection Time: 06/10/15  4:21 PM  Result Value Ref Range   Glucose-Capillary 169 (H) 65 - 99 mg/dL   Comment 1 Notify RN   Glucose, capillary     Status: Abnormal   Collection Time: 06/10/15  9:21 PM  Result Value Ref Range   Glucose-Capillary 126 (H) 65 - 99 mg/dL  CBC     Status: Abnormal   Collection Time: 06/11/15  5:08 AM  Result Value Ref Range   WBC 6.5 3.6 - 11.0 K/uL   RBC 2.91 (L) 3.80 - 5.20 MIL/uL   Hemoglobin 9.0 (L) 12.0 - 16.0 g/dL   HCT 27.0 (L) 35.0 - 47.0 %   MCV 93.0 80.0 - 100.0 fL   MCH 31.0 26.0 - 34.0 pg   MCHC 33.4 32.0 - 36.0 g/dL   RDW 13.5 11.5 - 14.5 %   Platelets 187 150 - 440 K/uL  Basic metabolic panel     Status: Abnormal   Collection Time: 06/11/15  5:08 AM  Result Value Ref Range   Sodium 133 (L) 135 - 145 mmol/L   Potassium 3.6 3.5 - 5.1 mmol/L   Chloride 100 (L) 101 - 111 mmol/L   CO2 23 22 - 32 mmol/L   Glucose, Bld 115 (H) 65 - 99 mg/dL   BUN 15 6 - 20 mg/dL   Creatinine, Ser 1.13 (H) 0.44 - 1.00 mg/dL   Calcium 9.5 8.9 - 10.3 mg/dL   GFR calc non Af Amer 45 (L) >60 mL/min   GFR calc Af Amer 52 (L) >60 mL/min    Comment: (NOTE) The eGFR has been calculated using the CKD EPI equation. This calculation has not been validated in all clinical situations. eGFR's persistently <60 mL/min signify possible Chronic Kidney Disease.    Anion gap 10 5 - 15  Glucose, capillary     Status: Abnormal   Collection Time: 06/11/15  8:33 AM  Result Value Ref Range   Glucose-Capillary 103 (H) 65 - 99 mg/dL  Glucose, capillary     Status: Abnormal   Collection Time: 06/11/15 11:55 AM  Result Value Ref  Range   Glucose-Capillary 177 (H) 65 - 99 mg/dL  Glucose, capillary     Status: Abnormal   Collection Time: 06/11/15  4:19 PM  Result Value Ref Range   Glucose-Capillary 150 (H) 65 - 99 mg/dL  Glucose, capillary     Status: Abnormal   Collection Time: 06/11/15  9:37  PM  Result Value Ref Range   Glucose-Capillary 150 (H) 65 - 99 mg/dL  CBC     Status: Abnormal   Collection Time: 06/12/15  4:58 AM  Result Value Ref Range   WBC 5.5 3.6 - 11.0 K/uL   RBC 3.10 (L) 3.80 - 5.20 MIL/uL   Hemoglobin 9.6 (L) 12.0 - 16.0 g/dL   HCT 28.7 (L) 35.0 - 47.0 %   MCV 92.6 80.0 - 100.0 fL   MCH 31.0 26.0 - 34.0 pg   MCHC 33.5 32.0 - 36.0 g/dL   RDW 13.5 11.5 - 14.5 %   Platelets 225 150 - 440 K/uL  Basic metabolic panel     Status: Abnormal   Collection Time: 06/12/15  4:58 AM  Result Value Ref Range   Sodium 134 (L) 135 - 145 mmol/L   Potassium 3.6 3.5 - 5.1 mmol/L   Chloride 99 (L) 101 - 111 mmol/L   CO2 24 22 - 32 mmol/L   Glucose, Bld 120 (H) 65 - 99 mg/dL   BUN 17 6 - 20 mg/dL   Creatinine, Ser 1.06 (H) 0.44 - 1.00 mg/dL   Calcium 9.9 8.9 - 10.3 mg/dL   GFR calc non Af Amer 49 (L) >60 mL/min   GFR calc Af Amer 56 (L) >60 mL/min    Comment: (NOTE) The eGFR has been calculated using the CKD EPI equation. This calculation has not been validated in all clinical situations. eGFR's persistently <60 mL/min signify possible Chronic Kidney Disease.    Anion gap 11 5 - 15  Glucose, capillary     Status: Abnormal   Collection Time: 06/12/15  8:33 AM  Result Value Ref Range   Glucose-Capillary 115 (H) 65 - 99 mg/dL  Glucose, capillary     Status: Abnormal   Collection Time: 06/12/15 11:29 AM  Result Value Ref Range   Glucose-Capillary 233 (H) 65 - 99 mg/dL   No components found for: ESR, C REACTIVE PROTEIN MICRO: Recent Results (from the past 720 hour(s))  Blood culture (routine x 2)     Status: None (Preliminary result)   Collection Time: 06/08/15  3:30 PM  Result Value Ref Range  Status   Specimen Description BLOOD RIGHT ASSIST CONTROL  Final   Special Requests BOTTLES DRAWN AEROBIC AND ANAEROBIC  5CC  Final   Culture NO GROWTH 4 DAYS  Final   Report Status PENDING  Incomplete  Blood culture (routine x 2)     Status: None (Preliminary result)   Collection Time: 06/08/15  3:42 PM  Result Value Ref Range Status   Specimen Description BLOOD LEFT ASSIST CONTROL  Final   Special Requests BOTTLES DRAWN AEROBIC AND ANAEROBIC  1CC  Final   Culture NO GROWTH 4 DAYS  Final   Report Status PENDING  Incomplete    IMAGING: Dg Chest 2 View  06/07/2015   CLINICAL DATA:  Onset of cough and wheezing 5 days ago, former smoker, history of diabetes  EXAM: CHEST  2 VIEW  COMPARISON:  Chest x-ray of June 22, 2013  FINDINGS: The lungs are well-expanded. The interstitial markings are coarse bilaterally. The heart is normal in size. The pulmonary vascularity is not engorged. There is no pleural effusion or pneumothorax. There is a small hiatal hernia. There is tortuosity of the descending thoracic aorta. The bony thorax is unremarkable.  IMPRESSION: Chronic bronchitic changes. There is no pneumonia nor other acute cardiopulmonary abnormality.   Electronically Signed   By: Delno Blaisdell  [place] M.D.  On: 06/07/2015 12:42   Ct Chest W Contrast  06/09/2015   CLINICAL DATA:  Pt with dementia and unable to give any hx. Per MD note in chart pt "with a known history of breast cancer, hypertension, diabetes, hyperlipidemia and dementia presents to the emergency room with complaints of feeling very weak and tired for the past few days. She was seen by her primary care provider and was started on Augmentin at that time also was noted to have fever of 102. Patient presents to the ED today complaining of similar type of symptoms. She is noted to have a temperature 102.7 here. She underwent a chest x-ray which did not show any abnormality her urinalysis was negative. Her WBC count is normal as well. Patient also  endorses weight loss. Over the past few months. She has some dry cough but according the family slight clearing her throat from time to time. There is no production to the cough. No chest pains palpitations no shortness of breath. No nausea vomiting or diarrhea. Denies any urinary frequency urgency or hesitancy."  EXAM: CT CHEST, ABDOMEN, AND PELVIS WITH CONTRAST  TECHNIQUE: Multidetector CT imaging of the chest, abdomen and pelvis was performed following the standard protocol during bolus administration of intravenous contrast.  CONTRAST:  47m OMNIPAQUE IOHEXOL 300 MG/ML  SOLN  COMPARISON:  Chest radiograph 06/07/2015.  FINDINGS: CT CHEST FINDINGS  Thoracic inlet: Right thyroid nodules largest measuring 12 mm. No neck base or axillary masses or adenopathy.  Mediastinum and hila: Heart mildly enlarged. There are dense coronary artery calcifications. Great vessels normal in caliber for age. Atherosclerotic calcifications along the aortic arch, descending thoracic aorta and aortic arch branch vessels. No mediastinal or hilar masses or pathologically enlarged lymph nodes. Nonspecific mild dilation of distal esophagus. No evidence of an esophageal mass.  Lungs and pleura: Patchy consolidation is noted in the posterior right lower lobe consistent with pneumonia. There is mild subsegmental atelectasis in both posterior lung bases. No lung mass or suspicious nodule. No pulmonary edema. No pleural effusion or pneumothorax.  CT ABDOMEN AND PELVIS FINDINGS  Liver, spleen, gallbladder, pancreas, adrenal glands:  Unremarkable.  Kidneys, ureters, bladder: Several small low-density renal lesions consistent with cysts. No stones. No hydronephrosis. Ureters normal in course and in caliber. Bladder is unremarkable.  Uterus and adnexa:  Uterus surgically absent.  No adnexal masses.  Lymph nodes:  No enlarged lymph nodes.  Ascites: None.  Vascular: Dense atherosclerotic calcifications along a mildly ectatic abdominal aorta and its  branch vessels. No aneurysm.  Gastrointestinal: There is mild wall thickening along the mid sigmoid colon over a short segment with mild adjacent fat stranding. Although this may be chronic, minimal uncomplicated diverticulitis should be suspected if there are consistent clinical symptoms. There are multiple other colonic diverticula with no other evidence of inflammation. There is no bowel dilation. Small bowel is unremarkable. No appendix visualized.  MUSCULOSKELETAL FINDINGS  No osteoblastic or osteolytic lesions. There are only mild degenerative changes throughout visualized spine.  IMPRESSION: 1. Right lower lobe pneumonia. 2. Small area of wall thickening and mild adjacent inflammatory stranding of the sigmoid colon. This may be chronic or reflect mild uncomplicated diverticulitis. 3. No other evidence of an acute abnormality within the chest, abdomen or pelvis.   Electronically Signed   By: DLajean ManesM.D.   On: 06/09/2015 09:24   Ct Abdomen Pelvis W Contrast  06/09/2015   CLINICAL DATA:  Pt with dementia and unable to give any hx. Per MD note  in chart pt "with a known history of breast cancer, hypertension, diabetes, hyperlipidemia and dementia presents to the emergency room with complaints of feeling very weak and tired for the past few days. She was seen by her primary care provider and was started on Augmentin at that time also was noted to have fever of 102. Patient presents to the ED today complaining of similar type of symptoms. She is noted to have a temperature 102.7 here. She underwent a chest x-ray which did not show any abnormality her urinalysis was negative. Her WBC count is normal as well. Patient also endorses weight loss. Over the past few months. She has some dry cough but according the family slight clearing her throat from time to time. There is no production to the cough. No chest pains palpitations no shortness of breath. No nausea vomiting or diarrhea. Denies any urinary frequency  urgency or hesitancy."  EXAM: CT CHEST, ABDOMEN, AND PELVIS WITH CONTRAST  TECHNIQUE: Multidetector CT imaging of the chest, abdomen and pelvis was performed following the standard protocol during bolus administration of intravenous contrast.  CONTRAST:  56m OMNIPAQUE IOHEXOL 300 MG/ML  SOLN  COMPARISON:  Chest radiograph 06/07/2015.  FINDINGS: CT CHEST FINDINGS  Thoracic inlet: Right thyroid nodules largest measuring 12 mm. No neck base or axillary masses or adenopathy.  Mediastinum and hila: Heart mildly enlarged. There are dense coronary artery calcifications. Great vessels normal in caliber for age. Atherosclerotic calcifications along the aortic arch, descending thoracic aorta and aortic arch branch vessels. No mediastinal or hilar masses or pathologically enlarged lymph nodes. Nonspecific mild dilation of distal esophagus. No evidence of an esophageal mass.  Lungs and pleura: Patchy consolidation is noted in the posterior right lower lobe consistent with pneumonia. There is mild subsegmental atelectasis in both posterior lung bases. No lung mass or suspicious nodule. No pulmonary edema. No pleural effusion or pneumothorax.  CT ABDOMEN AND PELVIS FINDINGS  Liver, spleen, gallbladder, pancreas, adrenal glands:  Unremarkable.  Kidneys, ureters, bladder: Several small low-density renal lesions consistent with cysts. No stones. No hydronephrosis. Ureters normal in course and in caliber. Bladder is unremarkable.  Uterus and adnexa:  Uterus surgically absent.  No adnexal masses.  Lymph nodes:  No enlarged lymph nodes.  Ascites: None.  Vascular: Dense atherosclerotic calcifications along a mildly ectatic abdominal aorta and its branch vessels. No aneurysm.  Gastrointestinal: There is mild wall thickening along the mid sigmoid colon over a short segment with mild adjacent fat stranding. Although this may be chronic, minimal uncomplicated diverticulitis should be suspected if there are consistent clinical symptoms.  There are multiple other colonic diverticula with no other evidence of inflammation. There is no bowel dilation. Small bowel is unremarkable. No appendix visualized.  MUSCULOSKELETAL FINDINGS  No osteoblastic or osteolytic lesions. There are only mild degenerative changes throughout visualized spine.  IMPRESSION: 1. Right lower lobe pneumonia. 2. Small area of wall thickening and mild adjacent inflammatory stranding of the sigmoid colon. This may be chronic or reflect mild uncomplicated diverticulitis. 3. No other evidence of an acute abnormality within the chest, abdomen or pelvis.   Electronically Signed   By: DLajean ManesM.D.   On: 06/09/2015 09:24   UKoreaVenous Img Lower Bilateral  06/12/2015   CLINICAL DATA:  Fever  EXAM: BILATERAL LOWER EXTREMITY VENOUS DOPPLER ULTRASOUND  TECHNIQUE: Gray-scale sonography with graded compression, as well as color Doppler and duplex ultrasound were performed to evaluate the lower extremity deep venous systems from the level of the common  femoral vein and including the common femoral, femoral, profunda femoral, popliteal and calf veins including the posterior tibial, peroneal and gastrocnemius veins when visible. The superficial great saphenous vein was also interrogated. Spectral Doppler was utilized to evaluate flow at rest and with distal augmentation maneuvers in the common femoral, femoral and popliteal veins.  COMPARISON:  None.  FINDINGS: RIGHT LOWER EXTREMITY  Common Femoral Vein: No evidence of thrombus. Normal compressibility, respiratory phasicity and response to augmentation.  Saphenofemoral Junction: No evidence of thrombus. Normal compressibility and flow on color Doppler imaging.  Profunda Femoral Vein: No evidence of thrombus. Normal compressibility and flow on color Doppler imaging.  Femoral Vein: No evidence of thrombus. Normal compressibility, respiratory phasicity and response to augmentation.  Popliteal Vein: No evidence of thrombus. Normal  compressibility, respiratory phasicity and response to augmentation.  Calf Veins: Abnormal right peroneal vein which is noncompressible with echogenic clot.  Superficial Great Saphenous Vein: No evidence of thrombus. Normal compressibility and flow on color Doppler imaging.  Venous Reflux:  None.  Other Findings:  None.  LEFT LOWER EXTREMITY  Common Femoral Vein: No evidence of thrombus. Normal compressibility, respiratory phasicity and response to augmentation.  Saphenofemoral Junction: No evidence of thrombus. Normal compressibility and flow on color Doppler imaging.  Profunda Femoral Vein: No evidence of thrombus. Normal compressibility and flow on color Doppler imaging.  Femoral Vein: No evidence of thrombus. Normal compressibility, respiratory phasicity and response to augmentation.  Popliteal Vein: No evidence of thrombus. Normal compressibility, respiratory phasicity and response to augmentation.  Calf Veins: No evidence of thrombus. Normal compressibility and flow on color Doppler imaging.  Superficial Great Saphenous Vein: No evidence of thrombus. Normal compressibility and flow on color Doppler imaging.  Venous Reflux:  None.  Other Findings: Left popliteal fossa cyst measuring 4.8 x 3.6 x 1.2 cm consistent with Baker cyst.  IMPRESSION: Isolated clot in the right peroneal vein. Otherwise no evidence of DVT  Left Baker cyst   Electronically Signed   By: Franchot Gallo M.D.   On: 06/12/2015 11:29    Assessment:   YENNY KOSA is a 81 y.o. female  With history of breast cancer,, hypertension, diabetes, hyperlipidemia and dementia admitted 8/6 with cough and fever. She has been febrile since admission despite nml WBC. She has been on augmentin (receied ceftriaxone and azithro for 8/6-7). BCx have been negative.  CT chest showed RLL infiltrate and CT abd with possible sigmoid diverticulitis. She is producing some sputum per family.  She has had some HA but this is improving and neck is supple so doubt  meningitis.   Most likely she has PNA with pathogen resistant to augmentin (could be Pseudomonas or MRSA but no MRSA risks identified).   Recommendations Check sputum culture Repeat bcx. Add diff onto CBC to look for eosinophils Change augmentin to zosyn. Add doxy as well for atypical coverage.   If persists consider Echo.  Thank you very much for allowing me to participate in the care of this patient. Please call with questions.   Cheral Marker. Ola Spurr, MD

## 2015-06-12 NOTE — Progress Notes (Signed)
ANTIBIOTIC CONSULT NOTE - INITIAL  Pharmacy Consult for Zosyn Indication: pneumonia  No Known Allergies  Patient Measurements: Height: 5\' 8"  (172.7 cm) Weight: 162 lb (73.483 kg) IBW/kg (Calculated) : 63.9 Adjusted Body Weight:   Vital Signs: Temp: 98.3 F (36.8 C) (08/10 0835) Temp Source: Oral (08/10 0835) BP: 114/63 mmHg (08/10 0835) Pulse Rate: 80 (08/10 0835) Intake/Output from previous day: 08/09 0701 - 08/10 0700 In: 240 [P.O.:240] Out: 400 [Urine:400] Intake/Output from this shift:    Labs:  Recent Labs  06/10/15 0527 06/11/15 0508 06/12/15 0458  WBC 7.9 6.5 5.5  HGB 8.9* 9.0* 9.6*  PLT 179 187 225  CREATININE 1.31* 1.13* 1.06*   Estimated Creatinine Clearance: 43.4 mL/min (by C-G formula based on Cr of 1.06). No results for input(s): VANCOTROUGH, VANCOPEAK, VANCORANDOM, GENTTROUGH, GENTPEAK, GENTRANDOM, TOBRATROUGH, TOBRAPEAK, TOBRARND, AMIKACINPEAK, AMIKACINTROU, AMIKACIN in the last 72 hours.   Microbiology: Recent Results (from the past 720 hour(s))  Blood culture (routine x 2)     Status: None (Preliminary result)   Collection Time: 06/08/15  3:30 PM  Result Value Ref Range Status   Specimen Description BLOOD RIGHT ASSIST CONTROL  Final   Special Requests BOTTLES DRAWN AEROBIC AND ANAEROBIC  5CC  Final   Culture NO GROWTH 4 DAYS  Final   Report Status PENDING  Incomplete  Blood culture (routine x 2)     Status: None (Preliminary result)   Collection Time: 06/08/15  3:42 PM  Result Value Ref Range Status   Specimen Description BLOOD LEFT ASSIST CONTROL  Final   Special Requests BOTTLES DRAWN AEROBIC AND ANAEROBIC  1CC  Final   Culture NO GROWTH 4 DAYS  Final   Report Status PENDING  Incomplete    Medical History: Past Medical History  Diagnosis Date  . History of breast cancer   . Dementia   . Diabetes mellitus without complication   . Hypertension   . Cancer     Medications:  Prescriptions prior to admission  Medication Sig Dispense  Refill Last Dose  . amoxicillin (AMOXIL) 500 MG capsule Take 2 capsules (1,000 mg total) by mouth 2 (two) times daily. 40 capsule 0 06/07/2015 at Unknown time  . aspirin EC 81 MG tablet Take 81 mg by mouth daily.   06/06/2015 at Unknown time  . calcium carbonate (OS-CAL) 600 MG TABS tablet Take 1 tablet by mouth daily.   06/06/2015  . donepezil (ARICEPT) 5 MG tablet Take 1 tablet by mouth daily.   06/06/2015  . glipiZIDE (GLUCOTROL XL) 5 MG 24 hr tablet Take 1 tablet by mouth daily.   06/06/2015  . lisinopril (PRINIVIL,ZESTRIL) 40 MG tablet Take 1 tablet (40 mg total) by mouth daily. 30 tablet 11 06/06/2015  . memantine (NAMENDA XR) 28 MG CP24 24 hr capsule Take 1 capsule by mouth daily.   06/06/2015  . metFORMIN (GLUCOPHAGE-XR) 500 MG 24 hr tablet Take 1 tablet by mouth daily.   06/06/2015  . metoprolol-hydrochlorothiazide (LOPRESSOR HCT) 100-50 MG per tablet TAKE ONE TABLET BY MOUTH ONCE DAILY 30 tablet 12 06/06/2015  . potassium chloride (K-DUR,KLOR-CON) 10 MEQ tablet Take 1 tablet by mouth daily.   06/06/2015  . pravastatin (PRAVACHOL) 40 MG tablet Take 1 tablet (40 mg total) by mouth daily. 30 tablet 12 06/06/2015  . Vitamin D, Ergocalciferol, (DRISDOL) 50000 UNITS CAPS capsule Take 1 capsule by mouth once a week. Take on Tuesday.   06/06/2015   Assessment: CrCl = 43.4 ml/min Pseudomonas risk factors: prior use of augmentin,  MD suspects Pseudomonas   Goal of Therapy:  resolution of infection  Plan:  Expected duration 7 days with resolution of temperature and/or normalization of WBC  Zosyn 4.5 gm IV Q8H EI ordered.   Briana Austin D 06/12/2015,3:00 PM

## 2015-06-12 NOTE — Progress Notes (Signed)
Speech Therapy Note: met w/ pt and her family during the lunch meal today. Pt sitting in her chair and feeding herself some of her lunch meal. Both pt and family reported no overt s/s of aspiration, no dysphagia, during the meals. Dtr. Stated pt has decreased appetite and eats only a little at meals. Suggested foods of pt's preference; foods she likes as well as supplements (drinks) when possible because these are easier than eating more foods much of the time w/ Cognitive decline/Dementia. Discussed the changes in oral intake when there is Cognitive decline as well as aging. Encouraged f/u w/ a Dietician if possible to maximize calories. Discussed suggestions of food options and preparation as well. Dtr. agreed w/ above. Reviewed chart notes; WBC 5/5; no elevated temp. Pt appears at reduced risk for aspiration at this time following general aspiration precautions. Rec. Continue w/ current diet as ordered w/ general aspiration precautions. Rec. Family f/u w/ a Dementia Support Group in the area; handout given. ST will be available for f/u while admitted. No further skilled ST services indicated at this time. NSG updated.

## 2015-06-13 LAB — CULTURE, BLOOD (ROUTINE X 2)
CULTURE: NO GROWTH
Culture: NO GROWTH

## 2015-06-13 LAB — CBC
HEMATOCRIT: 28.6 % — AB (ref 35.0–47.0)
HEMOGLOBIN: 9.8 g/dL — AB (ref 12.0–16.0)
MCH: 32 pg (ref 26.0–34.0)
MCHC: 34.3 g/dL (ref 32.0–36.0)
MCV: 93.1 fL (ref 80.0–100.0)
Platelets: 276 10*3/uL (ref 150–440)
RBC: 3.07 MIL/uL — ABNORMAL LOW (ref 3.80–5.20)
RDW: 13.6 % (ref 11.5–14.5)
WBC: 5.8 10*3/uL (ref 3.6–11.0)

## 2015-06-13 LAB — GLUCOSE, CAPILLARY
GLUCOSE-CAPILLARY: 175 mg/dL — AB (ref 65–99)
Glucose-Capillary: 116 mg/dL — ABNORMAL HIGH (ref 65–99)
Glucose-Capillary: 190 mg/dL — ABNORMAL HIGH (ref 65–99)
Glucose-Capillary: 156 mg/dL — ABNORMAL HIGH (ref 65–99)

## 2015-06-13 LAB — BASIC METABOLIC PANEL
Anion gap: 13 (ref 5–15)
BUN: 22 mg/dL — ABNORMAL HIGH (ref 6–20)
CALCIUM: 10.1 mg/dL (ref 8.9–10.3)
CO2: 23 mmol/L (ref 22–32)
CREATININE: 1.36 mg/dL — AB (ref 0.44–1.00)
Chloride: 97 mmol/L — ABNORMAL LOW (ref 101–111)
GFR calc Af Amer: 42 mL/min — ABNORMAL LOW (ref >60)
GFR calc non Af Amer: 36 mL/min — ABNORMAL LOW (ref >60)
Glucose, Bld: 124 mg/dL — ABNORMAL HIGH (ref 65–99)
POTASSIUM: 3.6 mmol/L (ref 3.5–5.1)
Sodium: 133 mmol/L — ABNORMAL LOW (ref 135–145)

## 2015-06-13 LAB — C DIFFICILE QUICK SCREEN W PCR REFLEX
C DIFFICLE (CDIFF) ANTIGEN: NEGATIVE
C Diff interpretation: NEGATIVE
C Diff toxin: NEGATIVE

## 2015-06-13 MED ORDER — LEVOFLOXACIN IN D5W 750 MG/150ML IV SOLN
750.0000 mg | INTRAVENOUS | Status: DC
  Administered 2015-06-13: 750 mg via INTRAVENOUS
  Filled 2015-06-13 (×2): qty 150

## 2015-06-13 NOTE — Progress Notes (Signed)
Physical Therapy Treatment Patient Details Name: Briana Austin MRN: [id number] DOB: Jan 22, 1934 Today's Date: 06/13/2015    History of Present Illness Pt is a 81 y.o. female presenting with weakness and 102.7 temperature.  Pt admitted with community-acquired PNA (R LL PNA) and possible diverticulitis seen on abdominal CT.    PT Comments    Pt demonstrating improved bed mobility and sitting balance but still requires CGA to min assist with transfers and ambulation for safety.  Pt appears more steady with RW than no AD but pt tends to push RW aside when she does not feel she needs it anymore.  Discussed this with pt's family and need for 24/7 assist with functional mobility for safety (and to use RW if pt willing to use).  Pt's daughter demonstrated appropriate safety assisting pt with functional mobility.  Follow Up Recommendations  Home health PT;Supervision/Assistance - 24 hour (Pt's daughter plans to provide this)     Equipment Recommendations   (Pt's family brought in [place] and RW fit to pt's height and front wheels adjusted so wheels were on inside of walker)    Recommendations for Other Services       Precautions / Restrictions Precautions Precautions: Fall Restrictions Weight Bearing Restrictions: No    Mobility  Bed Mobility Overal bed mobility: Needs Assistance Bed Mobility: Supine to Sit;Sit to Supine     Supine to sit: Supervision Sit to supine: Supervision      Transfers Overall transfer level: Needs assistance Equipment used: Rolling walker (2 wheeled);None Transfers: Sit to/from [employer] to Stand: Min guard;Min assist (from bed and from commode over toilet) Stand pivot transfers: Min guard;Min assist (to bed 2x and to commode over toilet 2x's)       General transfer comment: pt occasionally with posterior lean standing requiring assist to steady  Ambulation/Gait Ambulation/Gait assistance: Min guard;Min assist Ambulation Distance  (Feet):  (30 feet (in room) with RW; 150 feet (in room) no AD) Assistive device: Rolling walker (2 wheeled);None       General Gait Details: pt steady with RW; pt demonstrating increased R lateral lean no AD requiring intermittent assist to steady   Stairs            Wheelchair Mobility    Modified Rankin (Stroke Patients Only)       Balance Overall balance assessment: Needs assistance Sitting-balance support: No upper extremity supported;Feet supported Sitting balance-Leahy Scale: Good     Standing balance support: No upper extremity supported Standing balance-Leahy Scale: Good   Single Leg Stance - Right Leg: 0 Single Leg Stance - Left Leg: 0     [place] - Eyes Opened: 10 (feet shoulder width apart) [place] - Eyes Closed: 10 (feet shoulder width apart)   High Level Balance Comments: Rhomberg feet together 10 seconds eyes open; 3 seconds with eyes closed    Cognition Arousal/Alertness: Awake/alert Behavior During Therapy: Flat affect Overall Cognitive Status: History of cognitive impairments - at baseline                      Exercises      General Comments   Nursing cleared pt for participation in physical therapy.  Pt agreeable to PT session.  Pt's daughter, pt's husband, and pt's granddaughter present entire session.   Pt and pt's family educated on balance, fall prevention, walker use, and safety with functional mobility for safe discharge home.  Pt's daughter reports no concerns for discharge home.  Pertinent Vitals/Pain Pain Assessment: No/denies pain  Vitals stable and WFL throughout treatment session.    Home Living                      Prior Function            PT Goals (current goals can now be found in the care plan section) Acute Rehab PT Goals Patient Stated Goal: To walk more PT Goal Formulation: With patient/family Time For Goal Achievement: 06/25/15 Potential to Achieve Goals: Good Progress towards PT goals:  Progressing toward goals    Frequency  Min 2X/week    PT Plan Current plan remains appropriate    Co-evaluation             End of Session   Activity Tolerance: Patient tolerated treatment well Patient left: in bed;with call bell/phone within reach;with bed alarm set;with family/visitor present     Time: 8022-3361 PT Time Calculation (min) (ACUTE ONLY): 35 min  Charges:  $Therapeutic Exercise: 8-22 mins $Therapeutic Activity: 8-22 mins                    G CodesLeitha Bleak 15-Jun-2015, 1:48 PM Leitha Bleak, [place]

## 2015-06-13 NOTE — Progress Notes (Signed)
ANTIBIOTIC CONSULT NOTE - INITIAL  Pharmacy Consult for renal dose adjustment Indication: CAP   No Known Allergies  Vital Signs: Temp: 98.2 F (36.8 C) (08/11 0754) Temp Source: Oral (08/11 0754) BP: 129/67 mmHg (08/11 0754) Pulse Rate: 70 (08/11 0754)  Labs:  Recent Labs  06/11/15 0508 06/12/15 0458 06/13/15 0509  WBC 6.5 5.5 5.8  HGB 9.0* 9.6* 9.8*  PLT 187 225 276  CREATININE 1.13* 1.06* 1.36*   Estimated Creatinine Clearance: 33.8 mL/min (by C-G formula based on Cr of 1.36).  Microbiology: Recent Results (from the past 720 hour(s))  Blood culture (routine x 2)     Status: None   Collection Time: 06/08/15  3:30 PM  Result Value Ref Range Status   Specimen Description BLOOD RIGHT ASSIST CONTROL  Final   Special Requests BOTTLES DRAWN AEROBIC AND ANAEROBIC  5CC  Final   Culture NO GROWTH 5 DAYS  Final   Report Status 06/13/2015 FINAL  Final  Blood culture (routine x 2)     Status: None   Collection Time: 06/08/15  3:42 PM  Result Value Ref Range Status   Specimen Description BLOOD LEFT ASSIST CONTROL  Final   Special Requests BOTTLES DRAWN AEROBIC AND ANAEROBIC  1CC  Final   Culture NO GROWTH 5 DAYS  Final   Report Status 06/13/2015 FINAL  Final  Culture, blood (routine x 2)     Status: None (Preliminary result)   Collection Time: 06/12/15  4:27 PM  Result Value Ref Range Status   Specimen Description BLOOD RIGHT ARM  Final   Special Requests BOTTLES DRAWN AEROBIC AND ANAEROBIC 1CC  Final   Culture NO GROWTH < 24 HOURS  Final   Report Status PENDING  Incomplete  Culture, blood (routine x 2)     Status: None (Preliminary result)   Collection Time: 06/12/15  4:30 PM  Result Value Ref Range Status   Specimen Description BLOOD RIGHT HAND  Final   Special Requests BOTTLES DRAWN AEROBIC AND ANAEROBIC 1CC  Final   Culture NO GROWTH < 24 HOURS  Final   Report Status PENDING  Incomplete  C difficile quick scan w PCR reflex     Status: None   Collection Time:  06/13/15 12:33 PM  Result Value Ref Range Status   C Diff antigen NEGATIVE NEGATIVE Final   C Diff toxin NEGATIVE NEGATIVE Final   C Diff interpretation Negative for C. difficile  Final    Medical History: Past Medical History  Diagnosis Date  . History of breast cancer   . Dementia   . Diabetes mellitus without complication   . Hypertension   . Cancer     Assessment: Pt initiated on levofloxacin 750mg  q24 hours for CAP. CrCl of 33.81ml/min  Plan:  Adjust regimen to levofloxacin 750mg  IV q48hrs per UpToDate based on patient's renal function.   Briana Austin 06/13/2015,2:25 PM

## 2015-06-13 NOTE — Progress Notes (Signed)
[place] INFECTIOUS DISEASE PROGRESS NOTE Date of Admission:  06/08/2015     ID: ALYVIA DERK is a 81 y.o. female with PNA and fever  Active Problems:   Fever   Subjective: Fevers improving. Still with cough.  ROS  Eleven systems are reviewed and negative except per hpi  Medications:  Antibiotics Given (last 72 hours)    Date/Time Action Medication Dose Rate   06/10/15 1206 Given   amoxicillin-clavulanate (AUGMENTIN) 875-125 MG per tablet 1 tablet 1 tablet    06/10/15 2106 Given   amoxicillin-clavulanate (AUGMENTIN) 875-125 MG per tablet 1 tablet 1 tablet    06/11/15 0947 Given   amoxicillin-clavulanate (AUGMENTIN) 875-125 MG per tablet 1 tablet 1 tablet    06/11/15 2230 Given   amoxicillin-clavulanate (AUGMENTIN) 875-125 MG per tablet 1 tablet 1 tablet    06/12/15 0926 Given   amoxicillin-clavulanate (AUGMENTIN) 875-125 MG per tablet 1 tablet 1 tablet    06/12/15 1623 Given   piperacillin-tazobactam (ZOSYN) IVPB 4.5 g 4.5 g 25 mL/hr   06/12/15 2217 Given   piperacillin-tazobactam (ZOSYN) IVPB 4.5 g 4.5 g 25 mL/hr   06/13/15 0628 Given   piperacillin-tazobactam (ZOSYN) IVPB 4.5 g 4.5 g 25 mL/hr     . acidophilus  1 capsule Oral BID  . calcium carbonate  2.5 tablet Oral Q breakfast  . donepezil  5 mg Oral Daily  . enoxaparin (LOVENOX) injection  40 mg Subcutaneous Q24H  . feeding supplement (ENSURE ENLIVE)  237 mL Oral BID BM  . metoprolol tartrate  100 mg Oral Q1500   And  . hydrochlorothiazide  50 mg Oral Daily  . insulin aspart  0-9 Units Subcutaneous TID WC  . lisinopril  40 mg Oral Daily  . memantine  28 mg Oral Daily  . piperacillin-tazobactam (ZOSYN)  IV  4.5 g Intravenous 3 times per day  . potassium chloride  10 mEq Oral Daily  . pravastatin  40 mg Oral Daily  . Vitamin D (Ergocalciferol)  50,000 Units Oral Weekly    Objective: Vital signs in last 24 hours: Temp:  [97.5 F (36.4 C)-99.7 F (37.6 C)] 98.2 F (36.8 C) (08/11 0754) Pulse Rate:   [69-80] 70 (08/11 0754) Resp:  [16-18] 17 (08/11 0754) BP: (110-129)/(56-67) 129/67 mmHg (08/11 0754) SpO2:  [95 %-99 %] 99 % (08/11 0754) Constitutional:Awake alert No distress.  HENT: [place]/AT, PERRLA, no scleral icterus Mouth/Throat: Oropharynx is clear and moist. No oropharyngeal exudate.  Cardiovascular: Normal rate, somewhat distant Pulmonary/Chest: Effort normal and crackles R base Neck - supple, no nuchal rigidity Abdominal: Soft. Bowel sounds are normal. exhibits no distension. There is no tenderness.  Lymphadenopathy: no cervical adenopathy. No axillary adenopathy Neurological: alert and oriented to person, place, and time.  Skin: Skin is warm and dry. No rash noted. No erythema.  Psychiatric: a normal mood and affect. behavior is normal.   Lab Results  Recent Labs  06/12/15 0458 06/13/15 0509  WBC 5.5 5.8  HGB 9.6* 9.8*  HCT 28.7* 28.6*  NA 134* 133*  K 3.6 3.6  CL 99* 97*  CO2 24 23  BUN 17 22*  CREATININE 1.06* 1.36*    Microbiology: @micro @ Studies/Results: US Venous Img Lower Bilateral  06/12/2015   CLINICAL DATA:  Fever  EXAM: BILATERAL LOWER EXTREMITY VENOUS DOPPLER ULTRASOUND  TECHNIQUE: Gray-scale sonography with graded compression, as well as color Doppler and duplex ultrasound were performed to evaluate the lower extremity deep venous systems from the level of the common femoral vein and  including the common femoral, femoral, profunda femoral, popliteal and calf veins including the posterior tibial, peroneal and gastrocnemius veins when visible. The superficial great saphenous vein was also interrogated. Spectral Doppler was utilized to evaluate flow at rest and with distal augmentation maneuvers in the common femoral, femoral and popliteal veins.  COMPARISON:  None.  FINDINGS: RIGHT LOWER EXTREMITY  Common Femoral Vein: No evidence of thrombus. Normal compressibility, respiratory phasicity and response to augmentation.  Saphenofemoral Junction: No  evidence of thrombus. Normal compressibility and flow on color Doppler imaging.  Profunda Femoral Vein: No evidence of thrombus. Normal compressibility and flow on color Doppler imaging.  Femoral Vein: No evidence of thrombus. Normal compressibility, respiratory phasicity and response to augmentation.  Popliteal Vein: No evidence of thrombus. Normal compressibility, respiratory phasicity and response to augmentation.  Calf Veins: Abnormal right peroneal vein which is noncompressible with echogenic clot.  Superficial Great Saphenous Vein: No evidence of thrombus. Normal compressibility and flow on color Doppler imaging.  Venous Reflux:  None.  Other Findings:  None.  LEFT LOWER EXTREMITY  Common Femoral Vein: No evidence of thrombus. Normal compressibility, respiratory phasicity and response to augmentation.  Saphenofemoral Junction: No evidence of thrombus. Normal compressibility and flow on color Doppler imaging.  Profunda Femoral Vein: No evidence of thrombus. Normal compressibility and flow on color Doppler imaging.  Femoral Vein: No evidence of thrombus. Normal compressibility, respiratory phasicity and response to augmentation.  Popliteal Vein: No evidence of thrombus. Normal compressibility, respiratory phasicity and response to augmentation.  Calf Veins: No evidence of thrombus. Normal compressibility and flow on color Doppler imaging.  Superficial Great Saphenous Vein: No evidence of thrombus. Normal compressibility and flow on color Doppler imaging.  Venous Reflux:  None.  Other Findings: Left popliteal fossa cyst measuring 4.8 x 3.6 x 1.2 cm consistent with Baker cyst.  IMPRESSION: Isolated clot in the right peroneal vein. Otherwise no evidence of DVT  Left Baker cyst   Electronically Signed   By: Franchot Gallo M.D.   On: 06/12/2015 11:29    Assessment/Plan: EVEY MCMAHAN is a 81 y.o. female With history of breast cancer,, hypertension, diabetes, hyperlipidemia and dementia admitted 8/6 with cough  and fever. She has been febrile since admission despite nml WBC. She has been on augmentin (receied ceftriaxone and azithro for 8/6-7). BCx have been negative. CT chest showed RLL infiltrate and CT abd with possible sigmoid diverticulitis. She is producing some sputum per family. She has had some HA but this is improving and neck is supple so doubt meningitis.  Most likely she has PNA with pathogen resistant to augmentin (could be Pseudomonas or MRSA but no MRSA risks identified).   Recommendations Still trying to obtain sputum culture Repeat bcx pending Changed to levoflox and can observe Thank you very much for the consult. Will follow with you.  [place], [place]   06/13/2015, 7:55 AM

## 2015-06-13 NOTE — Progress Notes (Signed)
Attempted to page Dr. Volanda Napoleon x5 between 1711 and 1800.  Paged Dr. Anselm Jungling and notified him that patient complained of itching at the IV site (hand) and slightly up the arm following the vein beginning about halfway through first Levaquin infusion; arm is also reddened (not sure if this is from scratching or irritation).  IV is not infiltrated.  Per Dr. Anselm Jungling will restart infusion; if symptoms are repeated will stop infusion and also notify pharmacy.

## 2015-06-14 DIAGNOSIS — J189 Pneumonia, unspecified organism: Secondary | ICD-10-CM | POA: Diagnosis present

## 2015-06-14 HISTORY — DX: Pneumonia, unspecified organism: J18.9

## 2015-06-14 LAB — CBC
HEMATOCRIT: 29.8 % — AB (ref 35.0–47.0)
Hemoglobin: 9.9 g/dL — ABNORMAL LOW (ref 12.0–16.0)
MCH: 31 pg (ref 26.0–34.0)
MCHC: 33.1 g/dL (ref 32.0–36.0)
MCV: 93.5 fL (ref 80.0–100.0)
PLATELETS: 354 10*3/uL (ref 150–440)
RBC: 3.18 MIL/uL — ABNORMAL LOW (ref 3.80–5.20)
RDW: 13.6 % (ref 11.5–14.5)
WBC: 7.4 10*3/uL (ref 3.6–11.0)

## 2015-06-14 LAB — BASIC METABOLIC PANEL
Anion gap: 11 (ref 5–15)
BUN: 23 mg/dL — AB (ref 6–20)
CO2: 23 mmol/L (ref 22–32)
Calcium: 10.5 mg/dL — ABNORMAL HIGH (ref 8.9–10.3)
Chloride: 101 mmol/L (ref 101–111)
Creatinine, Ser: 1.34 mg/dL — ABNORMAL HIGH (ref 0.44–1.00)
GFR calc Af Amer: 42 mL/min — ABNORMAL LOW (ref >60)
GFR, EST NON AFRICAN AMERICAN: 37 mL/min — AB (ref >60)
Glucose, Bld: 138 mg/dL — ABNORMAL HIGH (ref 65–99)
Potassium: 4.1 mmol/L (ref 3.5–5.1)
SODIUM: 135 mmol/L (ref 135–145)

## 2015-06-14 LAB — GLUCOSE, CAPILLARY: GLUCOSE-CAPILLARY: 135 mg/dL — AB (ref 65–99)

## 2015-06-14 MED ORDER — ENSURE ENLIVE PO LIQD: 237.0000 mL | Freq: Two times a day (BID) | ORAL | Status: DC

## 2015-06-14 MED ORDER — RISAQUAD PO CAPS: 1.0000 | ORAL_CAPSULE | Freq: Two times a day (BID) | ORAL | Status: DC

## 2015-06-14 MED ORDER — LEVOFLOXACIN 750 MG PO TABS
750.0000 mg | ORAL_TABLET | Freq: Every day | ORAL | Status: DC
Start: 2015-06-14 — End: 2015-06-27

## 2015-06-14 NOTE — Progress Notes (Signed)
Nutrition Follow-up       INTERVENTION:  Meals and snacks: Cater to pt preferences Nutrition Supplement Therapy: continue supplements as ordered, no change per family request   NUTRITION DIAGNOSIS:   Inadequate oral intake related to acute illness as evidenced by per patient/family report, improved    GOAL:   Patient will meet greater than or equal to 90% of their needs  Meeting nutritional needs  MONITOR:    (Energy intake, Glucose profile, Electrolyte and renal profile)  REASON FOR ASSESSMENT:   Malnutrition Screening Tool    ASSESSMENT:      Pt hoping to be discharged today   Current Nutrition: eating 73% of meals over last few days.  Family reports intake has improved and pt doing better    Last BM: 8/12   Medications: reviewed  Electrolyte/Renal Profile and Glucose Profile:   Recent Labs Lab 06/12/15 0458 06/13/15 0509 06/14/15 0431  NA 134* 133* 135  K 3.6 3.6 4.1  CL 99* 97* 101  CO2 24 23 23   BUN 17 22* 23*  CREATININE 1.06* 1.36* 1.34*  CALCIUM 9.9 10.1 10.5*  GLUCOSE 120* 124* 138*   Protein Profile:  Recent Labs Lab 06/08/15 1224  ALBUMIN 3.6        Weight Trend since Admission: Filed Weights   06/08/15 1119  Weight: 162 lb (73.483 kg)      Diet Order:  Diet regular Room service appropriate?: Yes; Fluid consistency:: Thin  Skin:  Reviewed, no issues   Height:   Ht Readings from Last 1 Encounters:  06/08/15 5\' 8"  (1.727 m)    Weight:   Wt Readings from Last 1 Encounters:  06/08/15 162 lb (73.483 kg)      BMI:  Body mass index is 24.64 kg/(m^2).  Estimated Nutritional Needs:   Kcal:  BEE 1263 kcals (IF 1.0-1.2, AF 1.3) 0865-7846 kcals/d.   Protein:  (1.2-1.5 g/d) 89-111 g/d  Fluid:  (30-67ml/kg) 2220-252ml/d  EDUCATION NEEDS:   No education needs identified at this time  LOW Care Level  Satya Buttram B. Zenia Resides, [place], [place] (pager)

## 2015-06-14 NOTE — Care Management Important Message (Signed)
Important Message  Patient Details  Name: Briana Austin MRN: [id number] Date of Birth: July 01, 1934   Medicare Important Message Given:  Yes-third notification given    Juliann Pulse A Allmond 06/14/2015, 10:12 AM

## 2015-06-14 NOTE — Progress Notes (Signed)
[facility] at [place] NAME: Blakley Michna    MR#:  [id number]  DATE OF BIRTH:  [birth year]  SUBJECTIVE:  CHIEF COMPLAINT:   Chief Complaint  Patient presents with  . Weakness   Much better this morning, sitting up, eating well, energetic and conversant. Family feels she is not eating as much as before.  REVIEW OF SYSTEMS:   Review of Systems  Constitutional: Negative for fever.  Respiratory: Negative for shortness of breath.   Cardiovascular: Negative for chest pain and palpitations.  Gastrointestinal: Negative for nausea, vomiting and abdominal pain.  Genitourinary: Negative for dysuria.    DRUG ALLERGIES:  No Known Allergies  VITALS:  Blood pressure 117/65, pulse 86, temperature 98.6 F (37 C), temperature source Oral, resp. rate 16, height 5\' 8"  (1.727 m), weight 73.483 kg (162 lb), SpO2 96 %.  PHYSICAL EXAMINATION:  GENERAL:  81 y.o.-year-old patient , sitting up eating breakfast LUNGS: Normal breath sounds bilaterally, no wheezing, rales, rhonchi or crepitation. No use of accessory muscles of respiration.  CARDIOVASCULAR: S1, S2 normal. No murmurs, rubs, or gallops.  ABDOMEN: Soft, nontender, nondistended. Bowel sounds present. No organomegaly or mass.  EXTREMITIES: No pedal edema, cyanosis, or clubbing.  NEUROLOGIC: Cranial nerves II through XII are intact. Muscle strength 5/5 in all extremities. Sensation intact. Gait not checked.  PSYCHIATRIC: The patient is alert and oriented to person and place, not to date SKIN: No obvious rash, lesion, or ulcer.    LABORATORY PANEL:   CBC  Recent Labs Lab 06/14/15 0431  WBC 7.4  HGB 9.9*  HCT 29.8*  PLT 354   ------------------------------------------------------------------------------------------------------------------  Chemistries   Recent Labs Lab 06/08/15 1224  06/14/15 0431  NA 133*  < > 135  K 4.5  < > 4.1  CL 96*  < > 101  CO2 26  < > 23  GLUCOSE  231*  < > 138*  BUN 36*  < > 23*  CREATININE 1.52*  < > 1.34*  CALCIUM 10.0  < > 10.5*  AST 32  --   --   ALT 21  --   --   ALKPHOS 58  --   --   BILITOT 0.5  --   --   < > = values in this interval not displayed. ------------------------------------------------------------------------------------------------------------------  Cardiac Enzymes  Recent Labs Lab 06/08/15 1224  TROPONINI <0.03   ------------------------------------------------------------------------------------------------------------------  RADIOLOGY:  US Venous Img Lower Bilateral  06/12/2015   CLINICAL DATA:  Fever  EXAM: BILATERAL LOWER EXTREMITY VENOUS DOPPLER ULTRASOUND  TECHNIQUE: Gray-scale sonography with graded compression, as well as color Doppler and duplex ultrasound were performed to evaluate the lower extremity deep venous systems from the level of the common femoral vein and including the common femoral, femoral, profunda femoral, popliteal and calf veins including the posterior tibial, peroneal and gastrocnemius veins when visible. The superficial great saphenous vein was also interrogated. Spectral Doppler was utilized to evaluate flow at rest and with distal augmentation maneuvers in the common femoral, femoral and popliteal veins.  COMPARISON:  None.  FINDINGS: RIGHT LOWER EXTREMITY  Common Femoral Vein: No evidence of thrombus. Normal compressibility, respiratory phasicity and response to augmentation.  Saphenofemoral Junction: No evidence of thrombus. Normal compressibility and flow on color Doppler imaging.  Profunda Femoral Vein: No evidence of thrombus. Normal compressibility and flow on color Doppler imaging.  Femoral Vein: No evidence of thrombus. Normal compressibility, respiratory phasicity and response to augmentation.  Popliteal Vein: No evidence  of thrombus. Normal compressibility, respiratory phasicity and response to augmentation.  Calf Veins: Abnormal right peroneal vein which is noncompressible  with echogenic clot.  Superficial Great Saphenous Vein: No evidence of thrombus. Normal compressibility and flow on color Doppler imaging.  Venous Reflux:  None.  Other Findings:  None.  LEFT LOWER EXTREMITY  Common Femoral Vein: No evidence of thrombus. Normal compressibility, respiratory phasicity and response to augmentation.  Saphenofemoral Junction: No evidence of thrombus. Normal compressibility and flow on color Doppler imaging.  Profunda Femoral Vein: No evidence of thrombus. Normal compressibility and flow on color Doppler imaging.  Femoral Vein: No evidence of thrombus. Normal compressibility, respiratory phasicity and response to augmentation.  Popliteal Vein: No evidence of thrombus. Normal compressibility, respiratory phasicity and response to augmentation.  Calf Veins: No evidence of thrombus. Normal compressibility and flow on color Doppler imaging.  Superficial Great Saphenous Vein: No evidence of thrombus. Normal compressibility and flow on color Doppler imaging.  Venous Reflux:  None.  Other Findings: Left popliteal fossa cyst measuring 4.8 x 3.6 x 1.2 cm consistent with Baker cyst.  IMPRESSION: Isolated clot in the right peroneal vein. Otherwise no evidence of DVT  Left Baker cyst   Electronically Signed   By: Franchot Gallo M.D.   On: 06/12/2015 11:29    EKG:   Orders placed or performed during the hospital encounter of 06/08/15  . EKG 12-Lead  . EKG 12-Lead  . EKG 12-Lead  . EKG 12-Lead    ASSESSMENT AND PLAN:   #1 Fever - Admitted with temp 103 on 8/6. Have been using augmentin to treat possible CAP vs mild diverticulitis seen on admission CT scans.  Clinically doing much better, alert, eating, no complaints, more energetic.  Continues to have fevers up to 101.5 last night.  Blood cultures (-), urine (-) no skin findings, no specific complaints. No significant leucocytosis. ST states no aspiration. LE dopplers negative for DVT.   - appreciate ID consult, zosyn started. Will  change to levaquin for dc PO option and monitor temp overnight.  - unable to get sputum culture as patient unable to produce sample even with RT assistance   #3 diabetes mellitus: - CBG's well controlled - Continue metformin and sliding scale, holding glipizide will inpatient  #4 hypertension - Blood pressure is controlled - Continue lisinopril, hydrochlorothiazide, metoprolol  #5 hyperlipidemia: Continue statin therapy  #6 dementia: Continue Aricept. Family very supportive. PT eval is for HHPT, 24 hour supervision  #7 decreased oral intake: eating well today  Care management consult for [facility] needs.   CODE STATUS: Full  TOTAL TIME TAKING CARE OF THIS PATIENT: 35 minutes.  Greater than 50% of time spent in care coordination and counseling. Spoke with the family:  husband and daughter at the bedside. POSSIBLE D/C IN 1-2 DAYS, DEPENDING ON CLINICAL CONDITION.   Myrtis Ser M.D on 06/14/2015 at 7:27 AM  Between 7am to 6pm - Pager - [phone]  After 6pm go to [email or web address] - password [email or web address] [facility]  Office  [phone]  CC: Primary care physician; Lelon Huh, MD

## 2015-06-14 NOTE — Care Management (Signed)
Patient will discharge to home with family. Patient open to [place]. Notified Floydene Flock of impending discharge.

## 2015-06-14 NOTE — Discharge Instructions (Signed)
DIET:  Diabetic diet  DISCHARGE CONDITION:  Good  ACTIVITY:  Activity as tolerated  OXYGEN:  Home Oxygen: No.   Oxygen Delivery: room air  DISCHARGE LOCATION:  home   If you experience worsening of your admission symptoms, develop shortness of breath, life threatening emergency, suicidal or homicidal thoughts you must seek medical attention immediately by calling 911 or calling your MD immediately  if symptoms less severe.  You Must read complete instructions/literature along with all the possible adverse reactions/side effects for all the Medicines you take and that have been prescribed to you. Take any new Medicines after you have completely understood and accpet all the possible adverse reactions/side effects.   Please note  You were cared for by a hospitalist during your hospital stay. If you have any questions about your discharge medications or the care you received while you were in the hospital after you are discharged, you can call the unit and asked to speak with the hospitalist on call if the hospitalist that took care of you is not available. Once you are discharged, your primary care physician will handle any further medical issues. Please note that NO REFILLS for any discharge medications will be authorized once you are discharged, as it is imperative that you return to your primary care physician (or establish a relationship with a primary care physician if you do not have one) for your aftercare needs so that they can reassess your need for medications and monitor your lab values.    Pneumonia Pneumonia is an infection of the lungs.  CAUSES Pneumonia may be caused by bacteria or a virus. Usually, these infections are caused by breathing infectious particles into the lungs (respiratory tract). SIGNS AND SYMPTOMS   Cough.  Fever.  Chest pain.  Increased rate of breathing.  Wheezing.  Mucus production. DIAGNOSIS  If you have the common symptoms of  pneumonia, your health care provider will typically confirm the diagnosis with a chest X-ray. The X-ray will show an abnormality in the lung (pulmonary infiltrate) if you have pneumonia. Other tests of your blood, urine, or sputum may be done to find the specific cause of your pneumonia. Your health care provider may also do tests (blood gases or pulse oximetry) to see how well your lungs are working. TREATMENT  Some forms of pneumonia may be spread to other people when you cough or sneeze. You may be asked to wear a mask before and during your exam. Pneumonia that is caused by bacteria is treated with antibiotic medicine. Pneumonia that is caused by the influenza virus may be treated with an antiviral medicine. Most other viral infections must run their course. These infections will not respond to antibiotics.  HOME CARE INSTRUCTIONS   Cough suppressants may be used if you are losing too much rest. However, coughing protects you by clearing your lungs. You should avoid using cough suppressants if you can.  Your health care provider may have prescribed medicine if he or she thinks your pneumonia is caused by bacteria or influenza. Finish your medicine even if you start to feel better.  Your health care provider may also prescribe an expectorant. This loosens the mucus to be coughed up.  Take medicines only as directed by your health care provider.  Do not smoke. Smoking is a common cause of bronchitis and can contribute to pneumonia. If you are a smoker and continue to smoke, your cough may last several weeks after your pneumonia has cleared.  A cold steam vaporizer  or humidifier in your room or home may help loosen mucus.  Coughing is often worse at night. Sleeping in a semi-upright position in a recliner or using a couple pillows under your head will help with this.  Get rest as you feel it is needed. Your body will usually let you know when you need to rest. PREVENTION A pneumococcal shot  (vaccine) is available to prevent a common bacterial cause of pneumonia. This is usually suggested for:  People over 21 years old.  Patients on chemotherapy.  People with chronic lung problems, such as bronchitis or emphysema.  People with immune system problems. If you are over 65 or have a high risk condition, you may receive the pneumococcal vaccine if you have not received it before. In some countries, a routine influenza vaccine is also recommended. This vaccine can help prevent some cases of pneumonia.You may be offered the influenza vaccine as part of your care. If you smoke, it is time to quit. You may receive instructions on how to stop smoking. Your health care provider can provide medicines and counseling to help you quit. SEEK MEDICAL CARE IF: You have a fever. SEEK IMMEDIATE MEDICAL CARE IF:   Your illness becomes worse. This is especially true if you are elderly or weakened from any other disease.  You cannot control your cough with suppressants and are losing sleep.  You begin coughing up blood.  You develop pain which is getting worse or is uncontrolled with medicines.  Any of the symptoms which initially brought you in for treatment are getting worse rather than better.  You develop shortness of breath or chest pain. MAKE SURE YOU:   Understand these instructions.  Will watch your condition.  Will get help right away if you are not doing well or get worse. Document Released: 10/19/2005 Document Revised: 03/05/2014 Document Reviewed: 01/08/2011 [facility] Patient Information 2015 [place], [place]. This information is not intended to replace advice given to you by your health care provider. Make sure you discuss any questions you have with your health care provider.

## 2015-06-15 DIAGNOSIS — I10 Essential (primary) hypertension: Secondary | ICD-10-CM | POA: Diagnosis not present

## 2015-06-15 DIAGNOSIS — J189 Pneumonia, unspecified organism: Secondary | ICD-10-CM | POA: Diagnosis not present

## 2015-06-15 DIAGNOSIS — E785 Hyperlipidemia, unspecified: Secondary | ICD-10-CM | POA: Diagnosis not present

## 2015-06-15 DIAGNOSIS — E119 Type 2 diabetes mellitus without complications: Secondary | ICD-10-CM | POA: Diagnosis not present

## 2015-06-15 DIAGNOSIS — F0390 Unspecified dementia without behavioral disturbance: Secondary | ICD-10-CM | POA: Diagnosis not present

## 2015-06-15 DIAGNOSIS — Z87891 Personal history of nicotine dependence: Secondary | ICD-10-CM | POA: Diagnosis not present

## 2015-06-15 DIAGNOSIS — Z853 Personal history of malignant neoplasm of breast: Secondary | ICD-10-CM | POA: Diagnosis not present

## 2015-06-17 DIAGNOSIS — J189 Pneumonia, unspecified organism: Secondary | ICD-10-CM | POA: Diagnosis not present

## 2015-06-17 DIAGNOSIS — Z87891 Personal history of nicotine dependence: Secondary | ICD-10-CM | POA: Diagnosis not present

## 2015-06-17 DIAGNOSIS — Z853 Personal history of malignant neoplasm of breast: Secondary | ICD-10-CM | POA: Diagnosis not present

## 2015-06-17 DIAGNOSIS — E119 Type 2 diabetes mellitus without complications: Secondary | ICD-10-CM | POA: Diagnosis not present

## 2015-06-17 DIAGNOSIS — F0390 Unspecified dementia without behavioral disturbance: Secondary | ICD-10-CM | POA: Diagnosis not present

## 2015-06-17 DIAGNOSIS — E785 Hyperlipidemia, unspecified: Secondary | ICD-10-CM | POA: Diagnosis not present

## 2015-06-17 DIAGNOSIS — I10 Essential (primary) hypertension: Secondary | ICD-10-CM | POA: Diagnosis not present

## 2015-06-17 LAB — CULTURE, BLOOD (ROUTINE X 2)
Culture: NO GROWTH
Culture: NO GROWTH

## 2015-06-17 NOTE — Progress Notes (Signed)
Late entry gcodes for this eval.  Orinda Kenner, [place], CCC-SLP

## 2015-06-18 ENCOUNTER — Ambulatory Visit: Payer: Self-pay | Admitting: Family Medicine

## 2015-06-18 DIAGNOSIS — Z853 Personal history of malignant neoplasm of breast: Secondary | ICD-10-CM | POA: Diagnosis not present

## 2015-06-18 DIAGNOSIS — E785 Hyperlipidemia, unspecified: Secondary | ICD-10-CM | POA: Diagnosis not present

## 2015-06-18 DIAGNOSIS — F0390 Unspecified dementia without behavioral disturbance: Secondary | ICD-10-CM | POA: Diagnosis not present

## 2015-06-18 DIAGNOSIS — Z87891 Personal history of nicotine dependence: Secondary | ICD-10-CM | POA: Diagnosis not present

## 2015-06-18 DIAGNOSIS — E119 Type 2 diabetes mellitus without complications: Secondary | ICD-10-CM | POA: Diagnosis not present

## 2015-06-18 DIAGNOSIS — J189 Pneumonia, unspecified organism: Secondary | ICD-10-CM | POA: Diagnosis not present

## 2015-06-18 DIAGNOSIS — I10 Essential (primary) hypertension: Secondary | ICD-10-CM | POA: Diagnosis not present

## 2015-06-19 DIAGNOSIS — J189 Pneumonia, unspecified organism: Secondary | ICD-10-CM | POA: Diagnosis not present

## 2015-06-19 DIAGNOSIS — Z853 Personal history of malignant neoplasm of breast: Secondary | ICD-10-CM | POA: Diagnosis not present

## 2015-06-19 DIAGNOSIS — E785 Hyperlipidemia, unspecified: Secondary | ICD-10-CM | POA: Diagnosis not present

## 2015-06-19 DIAGNOSIS — F0390 Unspecified dementia without behavioral disturbance: Secondary | ICD-10-CM | POA: Diagnosis not present

## 2015-06-19 DIAGNOSIS — Z87891 Personal history of nicotine dependence: Secondary | ICD-10-CM | POA: Diagnosis not present

## 2015-06-19 DIAGNOSIS — E119 Type 2 diabetes mellitus without complications: Secondary | ICD-10-CM | POA: Diagnosis not present

## 2015-06-19 DIAGNOSIS — I10 Essential (primary) hypertension: Secondary | ICD-10-CM | POA: Diagnosis not present

## 2015-06-20 DIAGNOSIS — Z87891 Personal history of nicotine dependence: Secondary | ICD-10-CM | POA: Diagnosis not present

## 2015-06-20 DIAGNOSIS — E785 Hyperlipidemia, unspecified: Secondary | ICD-10-CM | POA: Diagnosis not present

## 2015-06-20 DIAGNOSIS — E119 Type 2 diabetes mellitus without complications: Secondary | ICD-10-CM | POA: Diagnosis not present

## 2015-06-20 DIAGNOSIS — F0390 Unspecified dementia without behavioral disturbance: Secondary | ICD-10-CM | POA: Diagnosis not present

## 2015-06-20 DIAGNOSIS — I10 Essential (primary) hypertension: Secondary | ICD-10-CM | POA: Diagnosis not present

## 2015-06-20 DIAGNOSIS — Z853 Personal history of malignant neoplasm of breast: Secondary | ICD-10-CM | POA: Diagnosis not present

## 2015-06-20 DIAGNOSIS — J189 Pneumonia, unspecified organism: Secondary | ICD-10-CM | POA: Diagnosis not present

## 2015-06-21 DIAGNOSIS — E785 Hyperlipidemia, unspecified: Secondary | ICD-10-CM | POA: Diagnosis not present

## 2015-06-21 DIAGNOSIS — I10 Essential (primary) hypertension: Secondary | ICD-10-CM | POA: Diagnosis not present

## 2015-06-21 DIAGNOSIS — Z87891 Personal history of nicotine dependence: Secondary | ICD-10-CM | POA: Diagnosis not present

## 2015-06-21 DIAGNOSIS — F0390 Unspecified dementia without behavioral disturbance: Secondary | ICD-10-CM | POA: Diagnosis not present

## 2015-06-21 DIAGNOSIS — Z853 Personal history of malignant neoplasm of breast: Secondary | ICD-10-CM | POA: Diagnosis not present

## 2015-06-21 DIAGNOSIS — J189 Pneumonia, unspecified organism: Secondary | ICD-10-CM | POA: Diagnosis not present

## 2015-06-21 DIAGNOSIS — E119 Type 2 diabetes mellitus without complications: Secondary | ICD-10-CM | POA: Diagnosis not present

## 2015-06-24 DIAGNOSIS — Z87891 Personal history of nicotine dependence: Secondary | ICD-10-CM | POA: Diagnosis not present

## 2015-06-24 DIAGNOSIS — I10 Essential (primary) hypertension: Secondary | ICD-10-CM | POA: Diagnosis not present

## 2015-06-24 DIAGNOSIS — F0390 Unspecified dementia without behavioral disturbance: Secondary | ICD-10-CM | POA: Diagnosis not present

## 2015-06-24 DIAGNOSIS — E785 Hyperlipidemia, unspecified: Secondary | ICD-10-CM | POA: Diagnosis not present

## 2015-06-24 DIAGNOSIS — Z853 Personal history of malignant neoplasm of breast: Secondary | ICD-10-CM | POA: Diagnosis not present

## 2015-06-24 DIAGNOSIS — E119 Type 2 diabetes mellitus without complications: Secondary | ICD-10-CM | POA: Diagnosis not present

## 2015-06-24 DIAGNOSIS — J189 Pneumonia, unspecified organism: Secondary | ICD-10-CM | POA: Diagnosis not present

## 2015-06-25 ENCOUNTER — Telehealth: Payer: Self-pay | Admitting: Family Medicine

## 2015-06-25 DIAGNOSIS — Z87891 Personal history of nicotine dependence: Secondary | ICD-10-CM | POA: Diagnosis not present

## 2015-06-25 DIAGNOSIS — Z853 Personal history of malignant neoplasm of breast: Secondary | ICD-10-CM | POA: Diagnosis not present

## 2015-06-25 DIAGNOSIS — E785 Hyperlipidemia, unspecified: Secondary | ICD-10-CM | POA: Diagnosis not present

## 2015-06-25 DIAGNOSIS — F0390 Unspecified dementia without behavioral disturbance: Secondary | ICD-10-CM | POA: Diagnosis not present

## 2015-06-25 DIAGNOSIS — J189 Pneumonia, unspecified organism: Secondary | ICD-10-CM | POA: Diagnosis not present

## 2015-06-25 DIAGNOSIS — I10 Essential (primary) hypertension: Secondary | ICD-10-CM | POA: Diagnosis not present

## 2015-06-25 DIAGNOSIS — E119 Type 2 diabetes mellitus without complications: Secondary | ICD-10-CM | POA: Diagnosis not present

## 2015-06-25 NOTE — Telephone Encounter (Signed)
Please review. Thanks!  

## 2015-06-25 NOTE — Telephone Encounter (Signed)
Annie Sable stated she visited the pt today and wanted to let Dr. Caryn Section know that she heard crackles in pt's lungs. Lacretia wanted Dr. Caryn Section to be aware so he would know when he sees pt tomorrow at [facility]. Thanks TNP

## 2015-06-26 ENCOUNTER — Ambulatory Visit (INDEPENDENT_AMBULATORY_CARE_PROVIDER_SITE_OTHER): Payer: Commercial Managed Care - HMO | Admitting: Family Medicine

## 2015-06-26 ENCOUNTER — Ambulatory Visit
Admission: RE | Admit: 2015-06-26 | Discharge: 2015-06-26 | Disposition: A | Discharge status: Home / Self Care | Payer: Commercial Managed Care - HMO | Source: Ambulatory Visit | Attending: Family Medicine | Admitting: Family Medicine

## 2015-06-26 ENCOUNTER — Other Ambulatory Visit: Payer: Self-pay | Admitting: Family Medicine

## 2015-06-26 VITALS — BP 120/60 | HR 88 | Temp 98.9°F | Resp 18 | Wt 160.6 lb

## 2015-06-26 DIAGNOSIS — E785 Hyperlipidemia, unspecified: Secondary | ICD-10-CM | POA: Diagnosis not present

## 2015-06-26 DIAGNOSIS — R059 Cough, unspecified: Secondary | ICD-10-CM

## 2015-06-26 DIAGNOSIS — Z87891 Personal history of nicotine dependence: Secondary | ICD-10-CM | POA: Diagnosis not present

## 2015-06-26 DIAGNOSIS — Z853 Personal history of malignant neoplasm of breast: Secondary | ICD-10-CM | POA: Diagnosis not present

## 2015-06-26 DIAGNOSIS — I10 Essential (primary) hypertension: Secondary | ICD-10-CM | POA: Diagnosis not present

## 2015-06-26 DIAGNOSIS — J189 Pneumonia, unspecified organism: Secondary | ICD-10-CM | POA: Insufficient documentation

## 2015-06-26 DIAGNOSIS — E119 Type 2 diabetes mellitus without complications: Secondary | ICD-10-CM | POA: Diagnosis not present

## 2015-06-26 DIAGNOSIS — R05 Cough: Secondary | ICD-10-CM | POA: Diagnosis not present

## 2015-06-26 DIAGNOSIS — F0390 Unspecified dementia without behavioral disturbance: Secondary | ICD-10-CM | POA: Diagnosis not present

## 2015-06-27 ENCOUNTER — Telehealth: Payer: Self-pay | Admitting: *Deleted

## 2015-06-27 ENCOUNTER — Inpatient Hospital Stay: Payer: Commercial Managed Care - HMO | Admitting: Family Medicine

## 2015-06-27 DIAGNOSIS — J189 Pneumonia, unspecified organism: Secondary | ICD-10-CM | POA: Diagnosis not present

## 2015-06-27 DIAGNOSIS — I10 Essential (primary) hypertension: Secondary | ICD-10-CM | POA: Diagnosis not present

## 2015-06-27 DIAGNOSIS — Z853 Personal history of malignant neoplasm of breast: Secondary | ICD-10-CM | POA: Diagnosis not present

## 2015-06-27 DIAGNOSIS — Z87891 Personal history of nicotine dependence: Secondary | ICD-10-CM | POA: Diagnosis not present

## 2015-06-27 DIAGNOSIS — E785 Hyperlipidemia, unspecified: Secondary | ICD-10-CM | POA: Diagnosis not present

## 2015-06-27 DIAGNOSIS — F0390 Unspecified dementia without behavioral disturbance: Secondary | ICD-10-CM | POA: Diagnosis not present

## 2015-06-27 DIAGNOSIS — E119 Type 2 diabetes mellitus without complications: Secondary | ICD-10-CM | POA: Diagnosis not present

## 2015-06-27 MED ORDER — LEVOFLOXACIN 500 MG PO TABS: 500.0000 mg | ORAL_TABLET | Freq: Every day | ORAL | Status: DC

## 2015-06-27 NOTE — Telephone Encounter (Signed)
-----   Message from Birdie Sons, MD sent at 06/27/2015  8:19 AM EDT ----- Pneumonia is nearly gone, but not completely. Recommend she take another round of levoquin 500mg  one daily for 7 days.

## 2015-06-27 NOTE — Telephone Encounter (Signed)
Patient's husband was notified of results. Expressed understanding. Rx sent to pharmacy.

## 2015-06-28 ENCOUNTER — Telehealth: Payer: Self-pay | Admitting: Family Medicine

## 2015-06-28 DIAGNOSIS — J189 Pneumonia, unspecified organism: Secondary | ICD-10-CM | POA: Diagnosis not present

## 2015-06-28 DIAGNOSIS — F0390 Unspecified dementia without behavioral disturbance: Secondary | ICD-10-CM | POA: Diagnosis not present

## 2015-06-28 DIAGNOSIS — E119 Type 2 diabetes mellitus without complications: Secondary | ICD-10-CM | POA: Diagnosis not present

## 2015-06-28 DIAGNOSIS — E785 Hyperlipidemia, unspecified: Secondary | ICD-10-CM | POA: Diagnosis not present

## 2015-06-28 DIAGNOSIS — Z853 Personal history of malignant neoplasm of breast: Secondary | ICD-10-CM | POA: Diagnosis not present

## 2015-06-28 DIAGNOSIS — Z87891 Personal history of nicotine dependence: Secondary | ICD-10-CM | POA: Diagnosis not present

## 2015-06-28 DIAGNOSIS — I10 Essential (primary) hypertension: Secondary | ICD-10-CM | POA: Diagnosis not present

## 2015-06-28 NOTE — Telephone Encounter (Signed)
Ok to order? Looks like patient has not had HgbA1c done in our office. Thanks!

## 2015-06-28 NOTE — Telephone Encounter (Signed)
Briana Austin with [place] request a verbal order a social worker to get involved for mail order supplies and to increase nurse visits to 2 times next week and 1 time a week to educate spouse.  Briana Austin is requesting  A1C results and if it has not been done in 3 months she is requesting an order to have this done.  CB#[phone]/MW

## 2015-07-01 DIAGNOSIS — Z853 Personal history of malignant neoplasm of breast: Secondary | ICD-10-CM | POA: Diagnosis not present

## 2015-07-01 DIAGNOSIS — I10 Essential (primary) hypertension: Secondary | ICD-10-CM | POA: Diagnosis not present

## 2015-07-01 DIAGNOSIS — Z87891 Personal history of nicotine dependence: Secondary | ICD-10-CM | POA: Diagnosis not present

## 2015-07-01 DIAGNOSIS — F0390 Unspecified dementia without behavioral disturbance: Secondary | ICD-10-CM | POA: Diagnosis not present

## 2015-07-01 DIAGNOSIS — E785 Hyperlipidemia, unspecified: Secondary | ICD-10-CM | POA: Diagnosis not present

## 2015-07-01 DIAGNOSIS — J189 Pneumonia, unspecified organism: Secondary | ICD-10-CM | POA: Diagnosis not present

## 2015-07-01 DIAGNOSIS — E119 Type 2 diabetes mellitus without complications: Secondary | ICD-10-CM | POA: Diagnosis not present

## 2015-07-01 NOTE — Telephone Encounter (Signed)
Pt daughter, [name] called to see if the add'l nurse visits have been approved.  CB#[phone]/MW

## 2015-07-01 NOTE — Discharge Summary (Signed)
[facility] at [place]   PATIENT NAME: Briana Austin    MR#:  [id number]  DATE OF BIRTH:  1934-06-06  DATE OF ADMISSION:  06/08/2015 ADMITTING PHYSICIAN: Dustin Flock, MD  DATE OF DISCHARGE: 06/14/2015  PRIMARY CARE PHYSICIAN: Lelon Huh, MD    ADMISSION DIAGNOSIS:  Fever of unknown origin [R50.9]  DISCHARGE DIAGNOSIS:  Active Problems:   Dementia   Hypertension   Physical deconditioning   Fever   CAP (community acquired pneumonia)   SECONDARY DIAGNOSIS:   Past Medical History  Diagnosis Date  . History of breast cancer   . Dementia   . Diabetes mellitus without complication   . Hypertension   . Cancer     HOSPITAL COURSE:    #1 Fever: Admitted with temp 103 on 8/6, chest x-ray showing potential pneumonia, CT abdomen pelvis shows possible mild diverticulosis. Initially treated with Rocephin but continued to have low-grade fevers. Transition to Augmentin with continued fever. She was seen by infectious disease and Zosyn started. Blood cultures urine cultures were negative. Lower extremity Dopplers negative for DVT. She had speech therapy evaluation which showed no evidence of aspiration or swallow dysfunction. She has been changed to oral Levaquin and has remained afebrile for 24 hours prior to discharge. At this point it seems most likely that she has a pneumonia which was not responding to Rocephin. Currently she has a good energy level and appetite. No respiratory symptoms.  #3 diabetes mellitus: - CBG's well controlled - Continue metformin and sliding scale, holding glipizide will inpatient  #4 hypertension - Blood pressure is controlled - Continue lisinopril, hydrochlorothiazide, metoprolol  #5 hyperlipidemia: Continue statin therapy  #6 dementia: Continue Aricept. Family very supportive. PT eval is for HHPT, 24 hour supervision to be provided by the family  #7 decreased oral intake: eating well  at the time of discharge   DISCHARGE CONDITIONS:   Stable  CONSULTS OBTAINED:  Treatment Team:  Dustin Flock, MD Adrian Prows, MD  DRUG ALLERGIES:  No Known Allergies  DISCHARGE MEDICATIONS:   Discharge Medication List as of 06/14/2015 11:38 AM    START taking these medications   Details  acidophilus (RISAQUAD) CAPS capsule Take 1 capsule by mouth 2 (two) times daily., Starting 06/14/2015, Until Discontinued, Print    feeding supplement, ENSURE ENLIVE, (ENSURE ENLIVE) LIQD Take 237 mLs by mouth 2 (two) times daily between meals., Starting 06/14/2015, Until Discontinued, Normal    levofloxacin (LEVAQUIN) 750 MG tablet Take 1 tablet (750 mg total) by mouth daily., Starting 06/14/2015, Until Discontinued, Print      CONTINUE these medications which have NOT CHANGED   Details  aspirin EC 81 MG tablet Take 81 mg by mouth daily., Until Discontinued, Historical Med    calcium carbonate (OS-CAL) 600 MG TABS tablet Take 1 tablet by mouth daily., Until Discontinued, Historical Med    donepezil (ARICEPT) 5 MG tablet Take 1 tablet by mouth daily., Starting 12/04/2014, Until Discontinued, Historical Med    lisinopril (PRINIVIL,ZESTRIL) 40 MG tablet Take 1 tablet (40 mg total) by mouth daily., Starting 05/03/2015, Until Discontinued, Normal    memantine (NAMENDA XR) 28 MG CP24 24 hr capsule Take 1 capsule by mouth daily., Starting 02/02/2015, Until Discontinued, Historical Med    metoprolol-hydrochlorothiazide (LOPRESSOR HCT) 100-50 MG per tablet TAKE ONE TABLET BY MOUTH ONCE DAILY, Normal    potassium chloride (K-DUR,KLOR-CON) 10 MEQ tablet Take 1 tablet by mouth daily., Starting 11/05/2014, Until Discontinued, Historical Med  pravastatin (PRAVACHOL) 40 MG tablet Take 1 tablet (40 mg total) by mouth daily., Starting 05/03/2015, Until Discontinued, Normal    Vitamin D, Ergocalciferol, (DRISDOL) 50000 UNITS CAPS capsule Take 1 capsule by mouth once a week. Take on Tuesday., Starting  09/30/2014, Until Discontinued, Historical Med      STOP taking these medications     amoxicillin (AMOXIL) 500 MG capsule      glipiZIDE (GLUCOTROL XL) 5 MG 24 hr tablet      metFORMIN (GLUCOPHAGE-XR) 500 MG 24 hr tablet          DISCHARGE INSTRUCTIONS:   See AVS  Today   CHIEF COMPLAINT:   Chief Complaint  Patient presents with  . Weakness    HISTORY OF PRESENT ILLNESS:  Briana Austin is a 81 y.o. female with a known history of breast cancer,, hypertension, diabetes, hyperlipidemia and dementia presents to the emergency room with complaints of feeling very weak and tired for the past few days. She was seen by her primary care provider and was started on Augmentin at that time also was noted to have fever of 102. Patient presents to the ED today complaining of similar type of symptoms. She is noted to have a temperature 102.7 here. She underwent a chest x-ray which did not show any abnormality her urinalysis was negative. Her WBC count is normal as well. Patient also endorses weight loss. Over the past few months. She has some dry cough but according the family slight clearing her throat from time to time. There is no production to the cough. No chest pains palpitations no shortness of breath. No nausea vomiting or diarrhea. Denies any urinary frequency urgency or hesitancy.  VITAL SIGNS:  Blood pressure 121/72, pulse 87, temperature 98.3 F (36.8 C), temperature source Oral, resp. rate 16, height 5\' 8"  (1.727 m), weight 73.483 kg (162 lb), SpO2 97 %.  I/O:  No intake or output data in the 24 hours ending 07/01/15 1352  PHYSICAL EXAMINATION:  GENERAL:  81 y.o.-year-old patient lying in the bed with no acute distress.  EYES: Pupils equal, round, reactive to light and accommodation. No scleral icterus. Extraocular muscles intact.  HEENT: Head atraumatic, normocephalic. Oropharynx and nasopharynx clear.  NECK:  Supple, no jugular venous distention. No thyroid enlargement, no  tenderness.  LUNGS: Normal breath sounds bilaterally, no wheezing, rales,rhonchi or crepitation. No use of accessory muscles of respiration.  CARDIOVASCULAR: S1, S2 normal. No murmurs, rubs, or gallops.  ABDOMEN: Soft, non-tender, non-distended. Bowel sounds present. No organomegaly or mass.  EXTREMITIES: No pedal edema, cyanosis, or clubbing.  NEUROLOGIC: Cranial nerves II through XII are intact. Muscle strength 5/5 in all extremities. Sensation intact. Gait not checked.  PSYCHIATRIC: The patient is alert and oriented to person SKIN: No obvious rash, lesion, or ulcer.   DATA REVIEW:   CBC No results for input(s): WBC, HGB, HCT, PLT in the last 168 hours.  Chemistries  No results for input(s): NA, K, CL, CO2, GLUCOSE, BUN, CREATININE, CALCIUM, MG, AST, ALT, ALKPHOS, BILITOT in the last 168 hours.  Invalid input(s): GFRCGP  Cardiac Enzymes No results for input(s): TROPONINI in the last 168 hours.  Microbiology Results  Results for orders placed or performed during the hospital encounter of 06/08/15  Blood culture (routine x 2)     Status: None   Collection Time: 06/08/15  3:30 PM  Result Value Ref Range Status   Specimen Description BLOOD RIGHT ASSIST CONTROL  Final   Special Requests BOTTLES DRAWN AEROBIC AND  ANAEROBIC  5CC  Final   Culture NO GROWTH 5 DAYS  Final   Report Status 06/13/2015 FINAL  Final  Blood culture (routine x 2)     Status: None   Collection Time: 06/08/15  3:42 PM  Result Value Ref Range Status   Specimen Description BLOOD LEFT ASSIST CONTROL  Final   Special Requests BOTTLES DRAWN AEROBIC AND ANAEROBIC  1CC  Final   Culture NO GROWTH 5 DAYS  Final   Report Status 06/13/2015 FINAL  Final  Culture, blood (routine x 2)     Status: None   Collection Time: 06/12/15  4:27 PM  Result Value Ref Range Status   Specimen Description BLOOD RIGHT ARM  Final   Special Requests BOTTLES DRAWN AEROBIC AND ANAEROBIC 1CC  Final   Culture NO GROWTH 5 DAYS  Final   Report  Status 06/17/2015 FINAL  Final  Culture, blood (routine x 2)     Status: None   Collection Time: 06/12/15  4:30 PM  Result Value Ref Range Status   Specimen Description BLOOD RIGHT HAND  Final   Special Requests BOTTLES DRAWN AEROBIC AND ANAEROBIC 1CC  Final   Culture NO GROWTH 5 DAYS  Final   Report Status 06/17/2015 FINAL  Final  C difficile quick scan w PCR reflex     Status: None   Collection Time: 06/13/15 12:33 PM  Result Value Ref Range Status   C Diff antigen NEGATIVE NEGATIVE Final   C Diff toxin NEGATIVE NEGATIVE Final   C Diff interpretation Negative for C. difficile  Final    RADIOLOGY:  No results found.  EKG:   Orders placed or performed during the hospital encounter of 06/08/15  . EKG 12-Lead  . EKG 12-Lead  . EKG 12-Lead  . EKG 12-Lead  . EKG  . EKG      Management plans discussed with the patient, family and they are in agreement.  CODE STATUS:  Advance Directive Documentation        Most Recent Value   Type of Advance Directive  Healthcare Power of Attorney   Pre-existing out of facility DNR order (yellow form or pink MOST form)     "MOST" Form in Place?        TOTAL TIME TAKING CARE OF THIS PATIENT: 45 minutes.  Greater than 50% of time spent in care coordination and counseling.  Myrtis Ser M.D on 07/01/2015 at 1:52 PM  Between 7am to 6pm - Pager - [phone]  After 6pm go to [email or web address] - password [email or web address] [facility]  Office  [phone]  CC: Primary care physician; Lelon Huh, MD

## 2015-07-01 NOTE — Telephone Encounter (Signed)
Verbal order given to [facility] at [facility]. [phone]).

## 2015-07-01 NOTE — Telephone Encounter (Signed)
Called [name], pt's daughter to let her know that the verbal order has been given to [facility].

## 2015-07-02 DIAGNOSIS — I10 Essential (primary) hypertension: Secondary | ICD-10-CM | POA: Diagnosis not present

## 2015-07-02 DIAGNOSIS — E119 Type 2 diabetes mellitus without complications: Secondary | ICD-10-CM | POA: Diagnosis not present

## 2015-07-02 DIAGNOSIS — J189 Pneumonia, unspecified organism: Secondary | ICD-10-CM | POA: Diagnosis not present

## 2015-07-02 DIAGNOSIS — F0390 Unspecified dementia without behavioral disturbance: Secondary | ICD-10-CM | POA: Diagnosis not present

## 2015-07-02 LAB — EXPECTORATED SPUTUM ASSESSMENT W GRAM STAIN, RFLX TO RESP C: Special Requests: NORMAL

## 2015-07-02 LAB — EXPECTORATED SPUTUM ASSESSMENT W REFEX TO RESP CULTURE

## 2015-07-03 ENCOUNTER — Emergency Department (HOSPITAL_COMMUNITY): Payer: Commercial Managed Care - HMO

## 2015-07-03 ENCOUNTER — Inpatient Hospital Stay (HOSPITAL_COMMUNITY)
Admission: EM | Admit: 2015-07-03 | Discharge: 2015-07-05 | DRG: 100 | Disposition: A | Discharge status: Home Health | Payer: Commercial Managed Care - HMO | Attending: Internal Medicine | Admitting: Internal Medicine

## 2015-07-03 ENCOUNTER — Encounter (HOSPITAL_COMMUNITY): Payer: Self-pay | Admitting: Neurology

## 2015-07-03 ENCOUNTER — Inpatient Hospital Stay (HOSPITAL_COMMUNITY): Payer: Commercial Managed Care - HMO

## 2015-07-03 DIAGNOSIS — G839 Paralytic syndrome, unspecified: Secondary | ICD-10-CM | POA: Diagnosis present

## 2015-07-03 DIAGNOSIS — Z87891 Personal history of nicotine dependence: Secondary | ICD-10-CM | POA: Diagnosis not present

## 2015-07-03 DIAGNOSIS — G8384 Todd's paralysis (postepileptic): Secondary | ICD-10-CM | POA: Insufficient documentation

## 2015-07-03 DIAGNOSIS — Z7982 Long term (current) use of aspirin: Secondary | ICD-10-CM

## 2015-07-03 DIAGNOSIS — N179 Acute kidney failure, unspecified: Secondary | ICD-10-CM | POA: Diagnosis not present

## 2015-07-03 DIAGNOSIS — F0280 Dementia in other diseases classified elsewhere without behavioral disturbance: Secondary | ICD-10-CM | POA: Diagnosis not present

## 2015-07-03 DIAGNOSIS — R569 Unspecified convulsions: Secondary | ICD-10-CM | POA: Diagnosis not present

## 2015-07-03 DIAGNOSIS — J189 Pneumonia, unspecified organism: Secondary | ICD-10-CM | POA: Diagnosis present

## 2015-07-03 DIAGNOSIS — I129 Hypertensive chronic kidney disease with stage 1 through stage 4 chronic kidney disease, or unspecified chronic kidney disease: Secondary | ICD-10-CM | POA: Diagnosis present

## 2015-07-03 DIAGNOSIS — R29810 Facial weakness: Secondary | ICD-10-CM | POA: Diagnosis present

## 2015-07-03 DIAGNOSIS — Z853 Personal history of malignant neoplasm of breast: Secondary | ICD-10-CM

## 2015-07-03 DIAGNOSIS — E119 Type 2 diabetes mellitus without complications: Secondary | ICD-10-CM | POA: Diagnosis not present

## 2015-07-03 DIAGNOSIS — G4089 Other seizures: Principal | ICD-10-CM | POA: Diagnosis present

## 2015-07-03 DIAGNOSIS — G40309 Generalized idiopathic epilepsy and epileptic syndromes, not intractable, without status epilepticus: Secondary | ICD-10-CM | POA: Diagnosis not present

## 2015-07-03 DIAGNOSIS — I82401 Acute embolism and thrombosis of unspecified deep veins of right lower extremity: Secondary | ICD-10-CM

## 2015-07-03 DIAGNOSIS — F0390 Unspecified dementia without behavioral disturbance: Secondary | ICD-10-CM | POA: Diagnosis not present

## 2015-07-03 DIAGNOSIS — G459 Transient cerebral ischemic attack, unspecified: Secondary | ICD-10-CM

## 2015-07-03 DIAGNOSIS — G309 Alzheimer's disease, unspecified: Secondary | ICD-10-CM | POA: Diagnosis present

## 2015-07-03 DIAGNOSIS — Z79899 Other long term (current) drug therapy: Secondary | ICD-10-CM | POA: Diagnosis not present

## 2015-07-03 DIAGNOSIS — E871 Hypo-osmolality and hyponatremia: Secondary | ICD-10-CM | POA: Diagnosis not present

## 2015-07-03 DIAGNOSIS — R404 Transient alteration of awareness: Secondary | ICD-10-CM | POA: Diagnosis not present

## 2015-07-03 DIAGNOSIS — G40409 Other generalized epilepsy and epileptic syndromes, not intractable, without status epilepticus: Secondary | ICD-10-CM | POA: Insufficient documentation

## 2015-07-03 DIAGNOSIS — E1122 Type 2 diabetes mellitus with diabetic chronic kidney disease: Secondary | ICD-10-CM | POA: Diagnosis present

## 2015-07-03 DIAGNOSIS — Z86718 Personal history of other venous thrombosis and embolism: Secondary | ICD-10-CM

## 2015-07-03 DIAGNOSIS — Z8673 Personal history of transient ischemic attack (TIA), and cerebral infarction without residual deficits: Secondary | ICD-10-CM | POA: Diagnosis not present

## 2015-07-03 DIAGNOSIS — I10 Essential (primary) hypertension: Secondary | ICD-10-CM | POA: Insufficient documentation

## 2015-07-03 DIAGNOSIS — R55 Syncope and collapse: Secondary | ICD-10-CM | POA: Diagnosis not present

## 2015-07-03 DIAGNOSIS — R7989 Other specified abnormal findings of blood chemistry: Secondary | ICD-10-CM | POA: Diagnosis not present

## 2015-07-03 DIAGNOSIS — E0781 Sick-euthyroid syndrome: Secondary | ICD-10-CM | POA: Diagnosis present

## 2015-07-03 DIAGNOSIS — R410 Disorientation, unspecified: Secondary | ICD-10-CM | POA: Diagnosis not present

## 2015-07-03 DIAGNOSIS — N183 Chronic kidney disease, stage 3 (moderate): Secondary | ICD-10-CM | POA: Diagnosis not present

## 2015-07-03 DIAGNOSIS — R51 Headache: Secondary | ICD-10-CM | POA: Diagnosis not present

## 2015-07-03 DIAGNOSIS — E785 Hyperlipidemia, unspecified: Secondary | ICD-10-CM | POA: Diagnosis present

## 2015-07-03 DIAGNOSIS — G40909 Epilepsy, unspecified, not intractable, without status epilepticus: Secondary | ICD-10-CM | POA: Diagnosis not present

## 2015-07-03 LAB — DIFFERENTIAL
BASOS ABS: 0 10*3/uL (ref 0.0–0.1)
BASOS PCT: 0 % (ref 0–1)
Eosinophils Absolute: 0.3 10*3/uL (ref 0.0–0.7)
Eosinophils Relative: 4 % (ref 0–5)
Lymphocytes Relative: 28 % (ref 12–46)
Lymphs Abs: 2.1 10*3/uL (ref 0.7–4.0)
Monocytes Absolute: 0.8 10*3/uL (ref 0.1–1.0)
Monocytes Relative: 11 % (ref 3–12)
NEUTROS ABS: 4.3 10*3/uL (ref 1.7–7.7)
NEUTROS PCT: 57 % (ref 43–77)

## 2015-07-03 LAB — CBC
HCT: 31 % — ABNORMAL LOW (ref 36.0–46.0)
Hemoglobin: 10.1 g/dL — ABNORMAL LOW (ref 12.0–15.0)
MCH: 30.6 pg (ref 26.0–34.0)
MCHC: 32.6 g/dL (ref 30.0–36.0)
MCV: 93.9 fL (ref 78.0–100.0)
PLATELETS: 152 10*3/uL (ref 150–400)
RBC: 3.3 MIL/uL — AB (ref 3.87–5.11)
RDW: 13.4 % (ref 11.5–15.5)
WBC: 7.5 10*3/uL (ref 4.0–10.5)

## 2015-07-03 LAB — PROTIME-INR
INR: 1.06 (ref 0.00–1.49)
PROTHROMBIN TIME: 14 s (ref 11.6–15.2)

## 2015-07-03 LAB — I-STAT CHEM 8, ED
BUN: 20 mg/dL (ref 6–20)
CHLORIDE: 95 mmol/L — AB (ref 101–111)
Calcium, Ion: 1.27 mmol/L (ref 1.13–1.30)
Creatinine, Ser: 1.6 mg/dL — ABNORMAL HIGH (ref 0.44–1.00)
Glucose, Bld: 111 mg/dL — ABNORMAL HIGH (ref 65–99)
HCT: 32 % — ABNORMAL LOW (ref 36.0–46.0)
Hemoglobin: 10.9 g/dL — ABNORMAL LOW (ref 12.0–15.0)
POTASSIUM: 4.1 mmol/L (ref 3.5–5.1)
SODIUM: 130 mmol/L — AB (ref 135–145)
TCO2: 19 mmol/L (ref 0–100)

## 2015-07-03 LAB — COMPREHENSIVE METABOLIC PANEL
ALBUMIN: 3.3 g/dL — AB (ref 3.5–5.0)
ALT: 12 U/L — AB (ref 14–54)
AST: 29 U/L (ref 15–41)
Alkaline Phosphatase: 47 U/L (ref 38–126)
Anion gap: 13 (ref 5–15)
BUN: 16 mg/dL (ref 6–20)
CHLORIDE: 97 mmol/L — AB (ref 101–111)
CO2: 19 mmol/L — AB (ref 22–32)
CREATININE: 1.7 mg/dL — AB (ref 0.44–1.00)
Calcium: 10.5 mg/dL — ABNORMAL HIGH (ref 8.9–10.3)
GFR calc non Af Amer: 27 mL/min — ABNORMAL LOW (ref >60)
GFR, EST AFRICAN AMERICAN: 32 mL/min — AB (ref >60)
GLUCOSE: 108 mg/dL — AB (ref 65–99)
Potassium: 4.3 mmol/L (ref 3.5–5.1)
SODIUM: 129 mmol/L — AB (ref 135–145)
Total Bilirubin: 0.4 mg/dL (ref 0.3–1.2)
Total Protein: 7.2 g/dL (ref 6.5–8.1)

## 2015-07-03 LAB — ETHANOL

## 2015-07-03 LAB — CBG MONITORING, ED: Glucose-Capillary: 104 mg/dL — ABNORMAL HIGH (ref 65–99)

## 2015-07-03 LAB — I-STAT TROPONIN, ED: Troponin i, poc: 0 ng/mL (ref 0.00–0.08)

## 2015-07-03 LAB — CK: Total CK: 60 U/L (ref 38–234)

## 2015-07-03 LAB — APTT: APTT: 21 s — AB (ref 24–37)

## 2015-07-03 LAB — GLUCOSE, CAPILLARY: Glucose-Capillary: 88 mg/dL (ref 65–99)

## 2015-07-03 MED ORDER — ACETAMINOPHEN 650 MG RE SUPP
650.0000 mg | Freq: Four times a day (QID) | RECTAL | Status: DC | PRN
  Administered 2015-07-04: 650 mg via RECTAL
  Filled 2015-07-03: qty 1

## 2015-07-03 MED ORDER — ONDANSETRON HCL 4 MG/2ML IJ SOLN: 4.0000 mg | Freq: Four times a day (QID) | INTRAMUSCULAR | Status: DC | PRN

## 2015-07-03 MED ORDER — ONDANSETRON HCL 4 MG PO TABS: 4.0000 mg | ORAL_TABLET | Freq: Four times a day (QID) | ORAL | Status: DC | PRN

## 2015-07-03 MED ORDER — ACETAMINOPHEN 325 MG PO TABS
650.0000 mg | ORAL_TABLET | Freq: Four times a day (QID) | ORAL | Status: DC | PRN
  Administered 2015-07-04 – 2015-07-05 (×2): 650 mg via ORAL
  Filled 2015-07-03 (×2): qty 2

## 2015-07-03 MED ORDER — SODIUM CHLORIDE 0.9 % IV SOLN
500.0000 mg | Freq: Two times a day (BID) | INTRAVENOUS | Status: DC
  Administered 2015-07-04 – 2015-07-05 (×3): 500 mg via INTRAVENOUS
  Filled 2015-07-03 (×4): qty 5

## 2015-07-03 MED ORDER — SODIUM CHLORIDE 0.9 % IJ SOLN
3.0000 mL | Freq: Two times a day (BID) | INTRAMUSCULAR | Status: DC
  Administered 2015-07-03 – 2015-07-04 (×2): 3 mL via INTRAVENOUS

## 2015-07-03 MED ORDER — INSULIN ASPART 100 UNIT/ML ~~LOC~~ SOLN
0.0000 [IU] | SUBCUTANEOUS | Status: DC
  Administered 2015-07-05: 1 [IU] via SUBCUTANEOUS

## 2015-07-03 MED ORDER — SODIUM CHLORIDE 0.9 % IV SOLN
1000.0000 mg | Freq: Once | INTRAVENOUS | Status: AC
  Administered 2015-07-03: 1000 mg via INTRAVENOUS
  Filled 2015-07-03: qty 10

## 2015-07-03 MED ORDER — LORAZEPAM 2 MG/ML IJ SOLN: 1.0000 mg | INTRAMUSCULAR | Status: DC | PRN

## 2015-07-03 MED ORDER — ENOXAPARIN SODIUM 40 MG/0.4ML ~~LOC~~ SOLN
40.0000 mg | SUBCUTANEOUS | Status: DC
  Administered 2015-07-03 – 2015-07-04 (×2): 40 mg via SUBCUTANEOUS
  Filled 2015-07-03 (×2): qty 0.4

## 2015-07-03 MED ORDER — LORAZEPAM 2 MG/ML IJ SOLN
INTRAMUSCULAR | Status: AC
  Filled 2015-07-03: qty 1

## 2015-07-03 MED ORDER — SODIUM CHLORIDE 0.9 % IV SOLN
INTRAVENOUS | Status: DC
  Administered 2015-07-03 – 2015-07-04 (×2): via INTRAVENOUS

## 2015-07-03 NOTE — ED Notes (Signed)
Patient transported to MRI 

## 2015-07-03 NOTE — Progress Notes (Signed)
Chaplain responded to phone request from ED to assist family of pt in Trauma C. I located pt's daughter in ED waiting area and escorted her to consult B. I then accompanied her to bedside at Trauma C after getting permission from nurse to do so.

## 2015-07-03 NOTE — Progress Notes (Signed)
EEG completed, results pending. 

## 2015-07-03 NOTE — ED Notes (Signed)
Pt CBG, 104. Nurse was notified.

## 2015-07-03 NOTE — H&P (Signed)
Date: 07/03/2015               Patient Name:  Briana Austin MRN: [id number]  DOB: 1933/12/07 Age / Sex: 81 y.o., female   PCP: Birdie Sons, MD         Medical Service: Internal Medicine Teaching Service         Attending Physician: Dr. Oval Linsey, MD    First Contact: Dr. Melburn Hake Pager: [phone]  Second Contact: Dr. Redmond Pulling Pager: [phone]       After Hours (After 5p/  First Contact Pager: [phone]  weekends / holidays): Second Contact Pager: [phone]   Chief Complaint: left sided weakness and shaking  History of Present Illness: Briana Austin is a 81 yo female with HTN, DMII, HLD, Alzheimers Dementia and h/o breast cancer, presenting as code stroke.  History was obtained from the patient's family.  Family reports that at approximately 2 pm, the patient became unresponsive, her eyes rolled back, she became stiff, and started shaking.  The episode lasted a couple minutes.  There was no tongue biting or loss of bowel/bladder control.  She remained slightly unresponsive with left sided weakness when EMS arrived.  She had returned to her normal mental state without residual symptoms by the time the patient arrived to the ED.     Family also notes that the patient has had decreased appetite and fluid intake for a long time.  She also hiccups with eating/drinking.  She is also recovering from RLL PNA, diagnosed August 5th.  Another 7 days of Levofloxacin was prescribed, as the PNA was incompletely resolved on 8/24.  Neurology consult in the ED found NIHSS of 1.  CT showed chronic atrophy, chronic microvascular disease, and remote right cerebellar lacunar infarct.  MRI/MRA brain was negative for stroke or mass.  EEG showed intermittent focal slowing in right hemisphere.  While interviewing patient and family, the patient had another episode of seizure like activity, with tonic-clonic movements of the extremities and post-ictal confusion.  Meds: Current Facility-Administered Medications    Medication Dose Route Frequency Provider Last Rate Last Dose  . 0.9 %  sodium chloride infusion   Intravenous Continuous Corky Sox, MD      . acetaminophen (TYLENOL) tablet 650 mg  650 mg Oral Q6H PRN Corky Sox, MD       Or  . acetaminophen (TYLENOL) suppository 650 mg  650 mg Rectal Q6H PRN Corky Sox, MD      . enoxaparin (LOVENOX) injection 40 mg  40 mg Subcutaneous Q24H Corky Sox, MD      . insulin aspart (novoLOG) injection 0-9 Units  0-9 Units Subcutaneous 6 times per day Corky Sox, MD      . levETIRAcetam (KEPPRA) 1,000 mg in sodium chloride 0.9 % 100 mL IVPB  1,000 mg Intravenous Once Corky Sox, MD      . Derrill Memo ON 07/04/2015] levETIRAcetam (KEPPRA) 500 mg in sodium chloride 0.9 % 100 mL IVPB  500 mg Intravenous Q12H Corky Sox, MD      . LORazepam (ATIVAN) 2 MG/ML injection           . LORazepam (ATIVAN) injection 1 mg  1 mg Intravenous PRN Iline Oven, MD      . ondansetron [facility]) tablet 4 mg  4 mg Oral Q6H PRN Corky Sox, MD       Or  . ondansetron [facility]) injection 4 mg  4 mg Intravenous Q6H PRN  Corky Sox, MD      . sodium chloride 0.9 % injection 3 mL  3 mL Intravenous Q12H Corky Sox, MD        Allergies: Allergies as of 07/03/2015  . (No Known Allergies)   Past Medical History  Diagnosis Date  . History of breast cancer   . Dementia   . Diabetes mellitus without complication   . Hypertension   . Cancer    Past Surgical History  Procedure Laterality Date  . Sigmoid polyp excision  11/24/2004    Hyperplastic polyp  . Breast lumpectomy Right 2005    Carcinoma in situ of right breast  . Total abdominal hysterectomy w/ bilateral salpingoophorectomy  1981    Benign   Family History  Problem Relation Age of Onset  . Stroke Mother   . Dementia Brother    Social History   Social History  . Marital Status: Married    Spouse Name: N/A  . Number of Children: 1  . Years of Education: HS Grad   Occupational History  . Retired      [employer]   Social History Main Topics  . Smoking status: Former Smoker -- 0.50 packs/day for 8 years    Types: Cigarettes  . Smokeless tobacco: Never Used  . Alcohol Use: No  . Drug Use: No  . Sexual Activity: No   Other Topics Concern  . Not on file   Social History Narrative    Review of Systems: Review of systems not obtained due to patient factors.  Physical Exam: Blood pressure 158/72, pulse 95, temperature 97.1 F (36.2 C), temperature source Oral, resp. rate 14, SpO2 95 %. Physical Exam  Constitutional: She is well-developed, well-nourished, and in no distress.  HENT:  Head: Normocephalic and atraumatic.  Eyes: EOM are normal. Pupils are equal, round, and reactive to light.  Neck: Normal range of motion. No tracheal deviation present.  Cardiovascular: Normal rate, regular rhythm, normal heart sounds and intact distal pulses.   Pulmonary/Chest: Effort normal. She has no wheezes.  Coarse expiratory sounds in anterior lung fields.  No wheezes or crackles.  Abdominal: Soft. She exhibits no distension. There is no tenderness. There is no rebound and no guarding.  Musculoskeletal: Normal range of motion. She exhibits no edema.  Neurological:  Patient initially alert and oriented to person.  Patient then experienced seizure like activity, with head turned to left, and tonic-clonic movements of upper extremities.  Episode lasted approximately 60s.  Patient not alert or oriented, or able to follow commands following episode.    Biceps and Patellar reflexes 2+ and symmetric. Babinski's negative bilaterally.  Skin: Skin is warm and dry.     Lab results: Basic Metabolic Panel:  Recent Labs  07/03/15 1616 07/03/15 1617  NA 129* 130*  K 4.3 4.1  CL 97* 95*  CO2 19*  --   GLUCOSE 108* 111*  BUN 16 20  CREATININE 1.70* 1.60*  CALCIUM 10.5*  --    Liver Function Tests:  Recent Labs  07/03/15 1616  AST 29  ALT 12*  ALKPHOS 47  BILITOT 0.4  PROT 7.2    ALBUMIN 3.3*   No results for input(s): LIPASE, AMYLASE in the last 72 hours. No results for input(s): AMMONIA in the last 72 hours. CBC:  Recent Labs  07/03/15 1616 07/03/15 1617  WBC 7.5  --   NEUTROABS 4.3  --   HGB 10.1* 10.9*  HCT 31.0* 32.0*  MCV 93.9  --  PLT 152  --    Cardiac Enzymes: No results for input(s): CKTOTAL, CKMB, CKMBINDEX, TROPONINI in the last 72 hours. BNP: No results for input(s): PROBNP in the last 72 hours. D-Dimer: No results for input(s): DDIMER in the last 72 hours. CBG:  Recent Labs  07/03/15 1611  GLUCAP 104*   Hemoglobin A1C: No results for input(s): HGBA1C in the last 72 hours. Fasting Lipid Panel: No results for input(s): CHOL, HDL, LDLCALC, TRIG, CHOLHDL, LDLDIRECT in the last 72 hours. Thyroid Function Tests: No results for input(s): TSH, T4TOTAL, FREET4, T3FREE, THYROIDAB in the last 72 hours. Anemia Panel: No results for input(s): VITAMINB12, FOLATE, FERRITIN, TIBC, IRON, RETICCTPCT in the last 72 hours. Coagulation:  Recent Labs  07/03/15 1616  LABPROT 14.0  INR 1.06   Urine Drug Screen: Drugs of Abuse  No results found for: LABOPIA, COCAINSCRNUR, LABBENZ, AMPHETMU, THCU, LABBARB  Alcohol Level:  Recent Labs  07/03/15 1630  ETH <5   Urinalysis: No results for input(s): COLORURINE, LABSPEC, PHURINE, GLUCOSEU, HGBUR, BILIRUBINUR, KETONESUR, PROTEINUR, UROBILINOGEN, NITRITE, LEUKOCYTESUR in the last 72 hours.  Invalid input(s): APPERANCEUR   Imaging results:  Ct Head Wo Contrast  07/03/2015   CLINICAL DATA:  Weakness seizure, with postictal left-sided deficit.  EXAM: CT HEAD WITHOUT CONTRAST  TECHNIQUE: Contiguous axial images were obtained from the base of the skull through the vertex without intravenous contrast.  COMPARISON:  None.  FINDINGS: No mass effect or midline shift. No evidence of acute intracranial hemorrhage, or infarction. 7 mm hypoattenuated structure within the right cerebellar may represent an old  lacunar infarct. There is atrophy and chronic small vessel disease changes. No abnormal extra-axial fluid collections. Gray-white matter differentiation is normal. Basal cisterns are preserved.  No depressed skull fractures. Visualized paranasal sinuses and mastoid air cells are not opacified.  IMPRESSION: No acute intracranial abnormality.  Atrophy, chronic microvascular disease.  Probable remote right cerebellar lacunar infarct.  These results were called by telephone at the time of interpretation on 07/03/2015 at 4:09 pm to Dr. Armida Sans , who verbally acknowledged these results.   Electronically Signed   By: Fidela Salisbury M.D.   On: 07/03/2015 16:12   Mr Jodene Nam Head Wo Contrast  07/03/2015   CLINICAL DATA:  Altered mental status. Left facial droop. Low weakness in the left upper extremity. Symptoms have since resolved. Persistent confusion with dementia at baseline.  EXAM: MRI HEAD WITHOUT CONTRAST  MRA HEAD WITHOUT CONTRAST  TECHNIQUE: Multiplanar, multiecho pulse sequences of the brain and surrounding structures were obtained without intravenous contrast. Angiographic images of the head were obtained using MRA technique without contrast.  COMPARISON:  CT head without contrast 07/03/2015.  FINDINGS: MRI HEAD FINDINGS  The diffusion-weighted images demonstrate no acute or subacute infarction. The lacunar infarct in the right cerebellum is confirmed to be remote. No acute hemorrhage or mass lesion is present. Moderate generalized atrophy and mild periventricular white matter changes are noted bilaterally. Study is moderately degraded by patient motion. Flow is present in the major intracranial arteries. The globes and orbits are intact. The paranasal sinuses and mastoid air cells are clear. Midline structures are grossly within normal limits.  MRA HEAD FINDINGS  The study is mildly degraded by patient motion. The internal carotid arteries are within normal limits from the high cervical segments through the ICA  termini. The A1 and M1 segments are normal. Anterior communicating artery is patent. The MCA bifurcations are intact. ACA and MCA branch vessels are within normal limits for age.  The left vertebral artery is slightly dominant to the right. The left PICA origin is visualized and normal. The right AICA is dominant. The basilar artery is normal. Both posterior cerebral arteries originate from the basilar tip. The PCA branch vessels are within normal limits.  IMPRESSION: 1. No acute intracranial abnormality. 2. Remote lacunar infarct of the right cerebellum. 3. Normal variant MRA circle of Willis without significant proximal stenosis, aneurysm, or branch vessel occlusion.   Electronically Signed   By: San Morelle M.D.   On: 07/03/2015 19:00   Mr Brain Wo Contrast  07/03/2015   CLINICAL DATA:  Altered mental status. Left facial droop. Low weakness in the left upper extremity. Symptoms have since resolved. Persistent confusion with dementia at baseline.  EXAM: MRI HEAD WITHOUT CONTRAST  MRA HEAD WITHOUT CONTRAST  TECHNIQUE: Multiplanar, multiecho pulse sequences of the brain and surrounding structures were obtained without intravenous contrast. Angiographic images of the head were obtained using MRA technique without contrast.  COMPARISON:  CT head without contrast 07/03/2015.  FINDINGS: MRI HEAD FINDINGS  The diffusion-weighted images demonstrate no acute or subacute infarction. The lacunar infarct in the right cerebellum is confirmed to be remote. No acute hemorrhage or mass lesion is present. Moderate generalized atrophy and mild periventricular white matter changes are noted bilaterally. Study is moderately degraded by patient motion. Flow is present in the major intracranial arteries. The globes and orbits are intact. The paranasal sinuses and mastoid air cells are clear. Midline structures are grossly within normal limits.  MRA HEAD FINDINGS  The study is mildly degraded by patient motion. The internal  carotid arteries are within normal limits from the high cervical segments through the ICA termini. The A1 and M1 segments are normal. Anterior communicating artery is patent. The MCA bifurcations are intact. ACA and MCA branch vessels are within normal limits for age.  The left vertebral artery is slightly dominant to the right. The left PICA origin is visualized and normal. The right AICA is dominant. The basilar artery is normal. Both posterior cerebral arteries originate from the basilar tip. The PCA branch vessels are within normal limits.  IMPRESSION: 1. No acute intracranial abnormality. 2. Remote lacunar infarct of the right cerebellum. 3. Normal variant MRA circle of Willis without significant proximal stenosis, aneurysm, or branch vessel occlusion.   Electronically Signed   By: San Morelle M.D.   On: 07/03/2015 19:00    Other results: EKG: RBBB, left axis deviation.  Assessment & Plan by Problem: Active Problems:   Facial droop   Seizure  Ms. Venturini is a 81 yo female with HTN, DMII, HLD, Alzheimers Dementia, and h/o breast cancer presenting with seizure.  Seizure: Patient has had 2 witnessed seizures with abnormal EEG. Etiology of seizure unclear, but patient has long h/o Alzheimer's dementia.  No h/o trauma or evidence of stroke.  No evidence of mass lesion on MRI, though patient has remote history of breast cancer.  - Neuro consult, apprecate rec's - Seizure precautions - Keppra 1000 mg load, then 500 mg BID, per neuro - Lorazepam 1 mg PRN seizure - Aspiration precautions - PT/OT - Speech eval  Dementia: Patient has reported h/o Alzheimers dementia, with unclear baseline.  Will hold meds in setting of new seizures. - Hold Aricept and Namenda  AKI on CKD3: Cr elevated to 1.6, with recent baseline 1-1.35.  Patient has had decreased PO fluid intake, as evidenced by hyponatremia/hypochloremia. - NS @ 125 cc/hr  DMII: Last A1c 6/15, with A1c 6.4.  Patient on Glipizide and  Metformin at home.  Will hold home meds in setting of new seizures. Can consider restart if patient's mental status improves and able to swallow pills - SSI - Hold Glipizide and Metformin  HTN: BPs normal since arrival to ED. One elevated reading was during seizure.  Will hold meds, monitor BP, and consider restart when patient able to take PO meds - Hold Lisinopril and Metop-HCTZ  HLD - Hold Pravastatin  PNA: Last dose of Levofloxacin tomorrow. - Levaquin 500 mg once on 9/1  H/o DVT: LE dopplers at previous admission demonstrated right peroneal DVT, untreated during that admission.  Patient has no pulmonary complaints or calf pain. Will discuss with primary team need for therapeutic anticoagulation.  H/o Breast cancer: Unknown subtype. Patient s/p lumpectomy and radiation. Her oncologist has signed off.    FEN/GI: - NPO  - NS @ 125 cc/hr  DVT Ppx: Lovenox (ppx)  Dispo: Disposition is deferred at this time, awaiting improvement of current medical problems.   The patient does have a current PCP Birdie Sons, MD) and does need an [facility] hospital follow-up appointment after discharge.  The patient does not have transportation limitations that hinder transportation to clinic appointments.  Signed: Iline Oven, MD, PhD 07/03/2015, 9:32 PM

## 2015-07-03 NOTE — Procedures (Signed)
ELECTROENCEPHALOGRAM REPORT   Patient: Briana Austin       Room #: TraC EEG No. ID: [id number] Age: 81 y.o.        Sex: female Referring Physician: Eppie Gibson Report Date:  07/03/2015        Interpreting Physician: Alexis Goodell  History: Briana Austin is an 81 y.o. female presenting after an episode of unresponsiveness followed by let facial droop and left grip weakness  Medications:  Scheduled:  Conditions of Recording:  This is a 16 channel EEG carried out with the patient in the awake and drowsy states.  Description:  The waking background activity consists of a low voltage, symmetrical, fairly well organized, 9 Hz alpha activity, seen from the parieto-occipital and posterior temporal regions.  Low voltage fast activity, poorly organized, is seen anteriorly and is at times superimposed on more posterior regions.  A mixture of theta and alpha rhythms are seen from the central and temporal regions.  There are intermittent periods of slowing noted over the right hemisphere when the backgrounds rhythm is slowed by an underlying polymorphic delta rhythm.  At times this slowing can be somewhat rhythmical.  No epileptiform activity is noted.  The patient drowses with slowing to irregular, low voltage theta and beta activity.   Stage II sleep is not obtained. Hyperventilation and intermittent photic stimulation were not performed.   IMPRESSION: This is an abnormal EEG secondary to intermittent focal slowing in the hemisphere region on the right.  This finding is suggestive of a focal disturbance that is etiologically nonspecific, but may include a mass lesion among other possibilities.       Alexis Goodell, MD [facility] [phone] 07/03/2015, 5:58 PM

## 2015-07-03 NOTE — Consult Note (Addendum)
Referring Physician: Dr Darl Householder    Chief Complaint: code stroke, left face droop, left hand weakness, dysarthria  HPI:                                                                                                                                         Briana Austin is an 81 y.o. female who was with her husband in the kitchen.  She was drinking a Ensure when he noted she started to shake.  She then slumped over and was unresponsive for a short period.  EMS was called and they noted slurred speech, left facial droop and weak left hand grip.  At time of arrival to ED symptoms had resolved. She remained confused but has dementia at baseline. No tPA was given due to minimal symptoms.  Initial NIHSS 1. CT head was personally reviewed and showed no acute abnormality.   Date last known well: Date: 07/03/2015 Time last known well: Time: 15:00 tPA Given: No: minimal symptoms NIHSS 1    Past Medical History  Diagnosis Date  . History of breast cancer   . Dementia   . Diabetes mellitus without complication   . Hypertension   . Cancer     Past Surgical History  Procedure Laterality Date  . Sigmoid polyp excision  11/24/2004    Hyperplastic polyp  . Breast lumpectomy Right 2005    Carcinoma in situ of right breast  . Total abdominal hysterectomy w/ bilateral salpingoophorectomy  1981    Benign    Family History  Problem Relation Age of Onset  . Stroke Mother   . Dementia Brother    Social History:  reports that she has quit smoking. Her smoking use included Cigarettes. She has a 4 pack-year smoking history. She has never used smokeless tobacco. She reports that she does not drink alcohol or use illicit drugs. Family history: unable to obtain due to dementia. Allergies: No Known Allergies  Medications:                                                                                                                           No current facility-administered medications for this encounter.    Current Outpatient Prescriptions  Medication Sig Dispense Refill  . acidophilus (RISAQUAD) CAPS capsule Take 1 capsule by mouth 2 (two) times daily. 60 capsule 0  . aspirin EC 81  MG tablet Take 81 mg by mouth daily.    . calcium carbonate (OS-CAL) 600 MG TABS tablet Take 1 tablet by mouth daily.    Marland Kitchen donepezil (ARICEPT) 5 MG tablet Take 1 tablet by mouth daily.    . feeding supplement, ENSURE ENLIVE, (ENSURE ENLIVE) LIQD Take 237 mLs by mouth 2 (two) times daily between meals. 237 mL 12  . levofloxacin (LEVAQUIN) 500 MG tablet Take 1 tablet (500 mg total) by mouth daily. 7 tablet 0  . lisinopril (PRINIVIL,ZESTRIL) 40 MG tablet Take 1 tablet (40 mg total) by mouth daily. 30 tablet 11  . memantine (NAMENDA XR) 28 MG CP24 24 hr capsule Take 1 capsule by mouth daily.    . metoprolol-hydrochlorothiazide (LOPRESSOR HCT) 100-50 MG per tablet TAKE ONE TABLET BY MOUTH ONCE DAILY 30 tablet 12  . potassium chloride (K-DUR,KLOR-CON) 10 MEQ tablet Take 1 tablet by mouth daily.    . pravastatin (PRAVACHOL) 40 MG tablet Take 1 tablet (40 mg total) by mouth daily. 30 tablet 12  . Vitamin D, Ergocalciferol, (DRISDOL) 50000 UNITS CAPS capsule Take 1 capsule by mouth once a week. Take on Tuesday.       ROS:                                                                                                                                       History obtained from the patient  General ROS: negative for - chills, fatigue, fever, night sweats, weight gain or weight loss Psychological ROS: negative for - behavioral disorder, hallucinations, memory difficulties, mood swings or suicidal ideation Ophthalmic ROS: negative for - blurry vision, double vision, eye pain or loss of vision ENT ROS: negative for - epistaxis, nasal discharge, oral lesions, sore throat, tinnitus or vertigo Allergy and Immunology ROS: negative for - hives or itchy/watery eyes Hematological and Lymphatic ROS: negative for - bleeding problems,  bruising or swollen lymph nodes Endocrine ROS: negative for - galactorrhea, hair pattern changes, polydipsia/polyuria or temperature intolerance Respiratory ROS: negative for - cough, hemoptysis, shortness of breath or wheezing Cardiovascular ROS: negative for - chest pain, dyspnea on exertion, edema or irregular heartbeat Gastrointestinal ROS: negative for - abdominal pain, diarrhea, hematemesis, nausea/vomiting or stool incontinence Genito-Urinary ROS: negative for - dysuria, hematuria, incontinence or urinary frequency/urgency Musculoskeletal ROS: negative for - joint swelling or muscular weakness Neurological ROS: as noted in HPI Dermatological ROS: negative for rash and skin lesion changes  Physical Examination:  There were no vitals taken for this visit.  HEENT-  Normocephalic, no lesions, without obvious abnormality.  Normal external eye and conjunctiva.  Normal TM's bilaterally.  Normal auditory canals and external ears. Normal external nose, mucus membranes and septum.  Normal pharynx. Cardiovascular- S1, S2 normal, pulses palpable throughout   Lungs- chest clear, no wheezing, rales, normal symmetric air entry Abdomen- normal findings: bowel sounds normal Extremities- no edema Lymph-no adenopathy palpable Musculoskeletal-no joint tenderness, deformity or swelling Skin-warm and dry, no hyperpigmentation, vitiligo, or suspicious lesions  Neurological Examination Mental Status: Alert, oriented to hospital but not date or month.  Speech fluent without evidence of aphasia.  Able to follow 3 step commands without difficulty. Cranial Nerves: II: Discs flat bilaterally; Visual fields grossly normal, pupils equal, round, reactive to light and accommodation III,IV, VI: ptosis not present, extra-ocular motions intact bilaterally V,VII: smile symmetric, facial light touch sensation normal  bilaterally VIII: hearing normal bilaterally IX,X: uvula rises symmetrically XI: bilateral shoulder shrug XII: midline tongue extension Motor: Right : Upper extremity   5/5    Left:     Upper extremity   5/5  Lower extremity   5/5     Lower extremity   5/5 --slight drift in the left leg Tone and bulk:normal tone throughout; no atrophy noted Sensory: Pinprick and light touch intact throughout, bilaterally Deep Tendon Reflexes: 1+ and symmetric throughout Plantars: Right: downgoing   Left: downgoing Cerebellar: normal finger-to-nose, normal rapid alternating movements and normal heel-to-shin test Gait: not tested due to acuity       Lab Results: Basic Metabolic Panel: No results for input(s): NA, K, CL, CO2, GLUCOSE, BUN, CREATININE, CALCIUM, MG, PHOS in the last 168 hours.  Liver Function Tests: No results for input(s): AST, ALT, ALKPHOS, BILITOT, PROT, ALBUMIN in the last 168 hours. No results for input(s): LIPASE, AMYLASE in the last 168 hours. No results for input(s): AMMONIA in the last 168 hours.  CBC: No results for input(s): WBC, NEUTROABS, HGB, HCT, MCV, PLT in the last 168 hours.  Cardiac Enzymes: No results for input(s): CKTOTAL, CKMB, CKMBINDEX, TROPONINI in the last 168 hours.  Lipid Panel: No results for input(s): CHOL, TRIG, HDL, CHOLHDL, VLDL, LDLCALC in the last 168 hours.  CBG: No results for input(s): GLUCAP in the last 168 hours.  Microbiology: Results for orders placed or performed during the hospital encounter of 06/08/15  Blood culture (routine x 2)     Status: None   Collection Time: 06/08/15  3:30 PM  Result Value Ref Range Status   Specimen Description BLOOD RIGHT ASSIST CONTROL  Final   Special Requests BOTTLES DRAWN AEROBIC AND ANAEROBIC  5CC  Final   Culture NO GROWTH 5 DAYS  Final   Report Status 06/13/2015 FINAL  Final  Blood culture (routine x 2)     Status: None   Collection Time: 06/08/15  3:42 PM  Result Value Ref Range Status    Specimen Description BLOOD LEFT ASSIST CONTROL  Final   Special Requests BOTTLES DRAWN AEROBIC AND ANAEROBIC  1CC  Final   Culture NO GROWTH 5 DAYS  Final   Report Status 06/13/2015 FINAL  Final  Culture, blood (routine x 2)     Status: None   Collection Time: 06/12/15  4:27 PM  Result Value Ref Range Status   Specimen Description BLOOD RIGHT ARM  Final   Special Requests BOTTLES DRAWN AEROBIC AND ANAEROBIC 1CC  Final   Culture NO GROWTH 5 DAYS  Final  Report Status 06/17/2015 FINAL  Final  Culture, blood (routine x 2)     Status: None   Collection Time: 06/12/15  4:30 PM  Result Value Ref Range Status   Specimen Description BLOOD RIGHT HAND  Final   Special Requests BOTTLES DRAWN AEROBIC AND ANAEROBIC 1CC  Final   Culture NO GROWTH 5 DAYS  Final   Report Status 06/17/2015 FINAL  Final  C difficile quick scan w PCR reflex     Status: None   Collection Time: 06/13/15 12:33 PM  Result Value Ref Range Status   C Diff antigen NEGATIVE NEGATIVE Final   C Diff toxin NEGATIVE NEGATIVE Final   C Diff interpretation Negative for C. difficile  Final  Culture, expectorated sputum-assessment     Status: None   Collection Time: 06/13/15  2:38 PM  Result Value Ref Range Status   Specimen Description SPUTUM  Final   Special Requests Normal  Final   Report Status 07/02/2015 FINAL  Final    Coagulation Studies: No results for input(s): LABPROT, INR in the last 72 hours.  Imaging: No results found.     Assessment and plan discussed with with attending physician and they are in agreement.    Etta Quill PA-C [facility] [phone]  07/03/2015, 4:05 PM   Assessment: 81 y.o. female presenting to hospital as code stroke after witnessed shaking event followed by slurred speech and left sided weakness.  Symptoms have resolved at this time. tPA was not administered due to minimal symptoms. At this time cannot rule out seizure with Todds paralysis versus TIA.   Stroke Risk  Factors - hypertension   Ordered: 1) MRI brain if negative no further stroke work up while in hospital.  2) EEG  Patient seen and examined together with physician assistant and I concur with the assessment and plan.  Dorian Pod, MD

## 2015-07-03 NOTE — ED Provider Notes (Signed)
CSN: [id number]     Arrival date & time 07/03/15  1549 History   First MD Initiated Contact with Patient 07/03/15 1556     Chief Complaint  Patient presents with  . Code Stroke     (Consider location/radiation/quality/duration/timing/severity/associated sxs/prior Treatment) Patient is a 81 y.o. female presenting with Acute Neurological Problem.  Cerebrovascular Accident This is a new problem. The current episode started today (1 hour pta). The problem occurs constantly. The problem has been rapidly improving. Associated symptoms include headaches. Pertinent negatives include no fever. Associated symptoms comments: Left facial droop Confusion Shaking. Nothing aggravates the symptoms. She has tried nothing for the symptoms.    Past Medical History  Diagnosis Date  . History of breast cancer   . Dementia   . Diabetes mellitus without complication   . Hypertension   . Cancer    Past Surgical History  Procedure Laterality Date  . Sigmoid polyp excision  11/24/2004    Hyperplastic polyp  . Breast lumpectomy Right 2005    Carcinoma in situ of right breast  . Total abdominal hysterectomy w/ bilateral salpingoophorectomy  1981    Benign   Family History  Problem Relation Age of Onset  . Stroke Mother   . Dementia Brother    Social History  Substance Use Topics  . Smoking status: Former Smoker -- 0.50 packs/day for 8 years    Types: Cigarettes  . Smokeless tobacco: Never Used  . Alcohol Use: No   OB History    No data available     Review of Systems  Unable to perform ROS: Acuity of condition  Constitutional: Negative for fever.  Neurological: Positive for facial asymmetry and headaches.  Psychiatric/Behavioral: Positive for confusion.      Allergies  Review of patient's allergies indicates no known allergies.  Home Medications   Prior to Admission medications   Medication Sig Start Date End Date Taking? Authorizing Provider  acidophilus (RISAQUAD) CAPS capsule  Take 1 capsule by mouth 2 (two) times daily. 06/14/15  Yes Aldean Jewett, MD  aspirin EC 81 MG tablet Take 81 mg by mouth daily.   Yes Historical Provider, MD  calcium carbonate (OS-CAL) 600 MG TABS tablet Take 1 tablet by mouth daily.   Yes Historical Provider, MD  donepezil (ARICEPT) 5 MG tablet Take 1 tablet by mouth at bedtime.  12/04/14  Yes Historical Provider, MD  feeding supplement, ENSURE ENLIVE, (ENSURE ENLIVE) LIQD Take 237 mLs by mouth 2 (two) times daily between meals. 06/14/15  Yes Aldean Jewett, MD  glipiZIDE (GLUCOTROL XL) 5 MG 24 hr tablet Take 5 mg by mouth daily with breakfast.   Yes Historical Provider, MD  lisinopril (PRINIVIL,ZESTRIL) 40 MG tablet Take 1 tablet (40 mg total) by mouth daily. Patient taking differently: Take 40 mg by mouth at bedtime.  05/03/15  Yes Margarita Rana, MD  memantine (NAMENDA XR) 28 MG CP24 24 hr capsule Take 1 capsule by mouth at bedtime.  02/02/15  Yes Birdie Sons, MD  metFORMIN (GLUMETZA) 500 MG (MOD) 24 hr tablet Take 500 mg by mouth daily with breakfast.   Yes Historical Provider, MD  metoprolol-hydrochlorothiazide (LOPRESSOR HCT) 100-50 MG per tablet TAKE ONE TABLET BY MOUTH ONCE DAILY Patient taking differently: TAKE ONE TABLET BY MOUTH ONCE DAILY AT BEDTIME 06/03/15  Yes Birdie Sons, MD  potassium chloride (K-DUR,KLOR-CON) 10 MEQ tablet Take 1 tablet by mouth at bedtime.  11/05/14  Yes Historical Provider, MD  pravastatin (PRAVACHOL) 40 MG tablet  Take 1 tablet (40 mg total) by mouth daily. Patient taking differently: Take 40 mg by mouth at bedtime.  05/03/15  Yes Margarita Rana, MD  Vitamin D, Ergocalciferol, (DRISDOL) 50000 UNITS CAPS capsule Take 1 capsule by mouth once a week. Take on Tuesday. 09/30/14  Yes Historical Provider, MD  levofloxacin (LEVAQUIN) 500 MG tablet Take 1 tablet (500 mg total) by mouth daily. Patient not taking: Reported on 07/03/2015 06/27/15   Birdie Sons, MD   BP 126/62 mmHg  Pulse 88  Temp(Src) 97.1 F (36.2  C) (Oral)  Resp 14  SpO2 98% Physical Exam  Constitutional: She appears well-developed and well-nourished. No distress.  HENT:  Head: Normocephalic and atraumatic.  Eyes: EOM are normal.  Neck: Normal range of motion.  Cardiovascular: Normal rate, regular rhythm and normal heart sounds.   No murmur heard. Pulmonary/Chest: Effort normal and breath sounds normal. No respiratory distress. She has no wheezes.  Abdominal: Soft. There is no tenderness.  Musculoskeletal: She exhibits no edema.  Neurological: She is alert. She has normal strength and normal reflexes. No cranial nerve deficit or sensory deficit. Coordination normal. GCS eye subscore is 4. GCS verbal subscore is 4. GCS motor subscore is 6.  Skin: She is not diaphoretic.    ED Course  Procedures (including critical care time) Labs Review Labs Reviewed  APTT - Abnormal; Notable for the following:    aPTT 21 (*)    All other components within normal limits  CBC - Abnormal; Notable for the following:    RBC 3.30 (*)    Hemoglobin 10.1 (*)    HCT 31.0 (*)    All other components within normal limits  COMPREHENSIVE METABOLIC PANEL - Abnormal; Notable for the following:    Sodium 129 (*)    Chloride 97 (*)    CO2 19 (*)    Glucose, Bld 108 (*)    Creatinine, Ser 1.70 (*)    Calcium 10.5 (*)    Albumin 3.3 (*)    ALT 12 (*)    GFR calc non Af Amer 27 (*)    GFR calc Af Amer 32 (*)    All other components within normal limits  I-STAT CHEM 8, ED - Abnormal; Notable for the following:    Sodium 130 (*)    Chloride 95 (*)    Creatinine, Ser 1.60 (*)    Glucose, Bld 111 (*)    Hemoglobin 10.9 (*)    HCT 32.0 (*)    All other components within normal limits  CBG MONITORING, ED - Abnormal; Notable for the following:    Glucose-Capillary 104 (*)    All other components within normal limits  ETHANOL  PROTIME-INR  DIFFERENTIAL  URINE RAPID DRUG SCREEN, HOSP PERFORMED  URINALYSIS, ROUTINE W REFLEX MICROSCOPIC (NOT AT  [facility])  I-STAT TROPOININ, ED    Imaging Review Ct Head Wo Contrast  07/03/2015   CLINICAL DATA:  Weakness seizure, with postictal left-sided deficit.  EXAM: CT HEAD WITHOUT CONTRAST  TECHNIQUE: Contiguous axial images were obtained from the base of the skull through the vertex without intravenous contrast.  COMPARISON:  None.  FINDINGS: No mass effect or midline shift. No evidence of acute intracranial hemorrhage, or infarction. 7 mm hypoattenuated structure within the right cerebellar may represent an old lacunar infarct. There is atrophy and chronic small vessel disease changes. No abnormal extra-axial fluid collections. Gray-white matter differentiation is normal. Basal cisterns are preserved.  No depressed skull fractures. Visualized paranasal sinuses and mastoid  air cells are not opacified.  IMPRESSION: No acute intracranial abnormality.  Atrophy, chronic microvascular disease.  Probable remote right cerebellar lacunar infarct.  These results were called by telephone at the time of interpretation on 07/03/2015 at 4:09 pm to Dr. Armida Sans , who verbally acknowledged these results.   Electronically Signed   By: Fidela Salisbury M.D.   On: 07/03/2015 16:12   I have personally reviewed and evaluated these images and lab results as part of my medical decision-making.   EKG Interpretation None      MDM   Final diagnoses:  Facial droop   Patient is a 81 year old female with history of dementia, diabetes, hypertension that presents after complaining of headache, left-sided facial droop, speech finding difficulty. EMS arrived on scene and husband reported that she had a shaking episode that he cannot further describe. EMS reports that she did appear to possibly be post ictal. Upon arrival to the ED the patient has no more facial droop and EMS reports that it was present upon arrival to the scene. The patient apparently was very confused in transit and asking repetitive questions however patient is not  doing this here at this time. Neurology met the patient at the ambulance bay and was emergently taken to CT. The differential includes CVA versus TIA versus Todd's paralysis.  Patient's CT shows no evidence of CVA, patient will be admitted to hospitalist with neurology following for inpatient workup to include MRI.      Renne Musca, MD 07/03/15 Yevette Edwards  Wandra Arthurs, MD 07/03/15 [phone]

## 2015-07-03 NOTE — Code Documentation (Signed)
81 yo female arriving to [facility] via [place].  EMS reports patient was at home with her husband eating lunch when she became unresponsive with an episode of shaking.  EMS called and assessed left facial droop, left sided weakness and confusion.  Code stroke activated.  Stroke team at the bedside on arrival.  Labs drawn and patient cleared by Dr. Darl Householder.  Patient to CT.  NIHSS 3, see documentation for details and code stroke times.  Patient with improvement in symptoms.  Suspect patient is post-ictal.  Dr. Armida Sans at the bedside.  No acute stroke treatment at this time, however, patient remains in the window to treat with tPA until 1945.  Bedside handoff with ED RN Judson Roch.

## 2015-07-03 NOTE — ED Notes (Signed)
Per EMS- Pt was at home eating lunch when she had a seizure, became unresp and collapses onto table. Was witnessed by husband. When EMS arrived pt had left sided facial droop, right sided h/a, repeating questions. BP 120/71, HR 93 with RBBB, Husband reports dementia.

## 2015-07-04 ENCOUNTER — Inpatient Hospital Stay (HOSPITAL_COMMUNITY): Payer: Commercial Managed Care - HMO

## 2015-07-04 DIAGNOSIS — G309 Alzheimer's disease, unspecified: Secondary | ICD-10-CM

## 2015-07-04 DIAGNOSIS — J189 Pneumonia, unspecified organism: Secondary | ICD-10-CM

## 2015-07-04 DIAGNOSIS — E871 Hypo-osmolality and hyponatremia: Secondary | ICD-10-CM

## 2015-07-04 DIAGNOSIS — I129 Hypertensive chronic kidney disease with stage 1 through stage 4 chronic kidney disease, or unspecified chronic kidney disease: Secondary | ICD-10-CM

## 2015-07-04 DIAGNOSIS — R569 Unspecified convulsions: Secondary | ICD-10-CM

## 2015-07-04 DIAGNOSIS — F0390 Unspecified dementia without behavioral disturbance: Secondary | ICD-10-CM

## 2015-07-04 DIAGNOSIS — E1122 Type 2 diabetes mellitus with diabetic chronic kidney disease: Secondary | ICD-10-CM

## 2015-07-04 DIAGNOSIS — G459 Transient cerebral ischemic attack, unspecified: Secondary | ICD-10-CM

## 2015-07-04 DIAGNOSIS — N179 Acute kidney failure, unspecified: Secondary | ICD-10-CM

## 2015-07-04 DIAGNOSIS — G40409 Other generalized epilepsy and epileptic syndromes, not intractable, without status epilepticus: Secondary | ICD-10-CM | POA: Insufficient documentation

## 2015-07-04 DIAGNOSIS — I10 Essential (primary) hypertension: Secondary | ICD-10-CM | POA: Insufficient documentation

## 2015-07-04 DIAGNOSIS — G8384 Todd's paralysis (postepileptic): Secondary | ICD-10-CM | POA: Insufficient documentation

## 2015-07-04 DIAGNOSIS — Z86718 Personal history of other venous thrombosis and embolism: Secondary | ICD-10-CM

## 2015-07-04 DIAGNOSIS — G40909 Epilepsy, unspecified, not intractable, without status epilepticus: Secondary | ICD-10-CM

## 2015-07-04 DIAGNOSIS — Z79899 Other long term (current) drug therapy: Secondary | ICD-10-CM

## 2015-07-04 DIAGNOSIS — I82401 Acute embolism and thrombosis of unspecified deep veins of right lower extremity: Secondary | ICD-10-CM

## 2015-07-04 DIAGNOSIS — E785 Hyperlipidemia, unspecified: Secondary | ICD-10-CM

## 2015-07-04 DIAGNOSIS — Z853 Personal history of malignant neoplasm of breast: Secondary | ICD-10-CM

## 2015-07-04 DIAGNOSIS — F0280 Dementia in other diseases classified elsewhere without behavioral disturbance: Secondary | ICD-10-CM | POA: Insufficient documentation

## 2015-07-04 DIAGNOSIS — N183 Chronic kidney disease, stage 3 (moderate): Secondary | ICD-10-CM

## 2015-07-04 DIAGNOSIS — R7989 Other specified abnormal findings of blood chemistry: Secondary | ICD-10-CM

## 2015-07-04 LAB — LIPID PANEL
Cholesterol: 116 mg/dL (ref 0–200)
HDL: 38 mg/dL — AB (ref >40)
LDL CALC: 66 mg/dL (ref 0–99)
TRIGLYCERIDES: 59 mg/dL (ref <150)
Total CHOL/HDL Ratio: 3.1 RATIO
VLDL: 12 mg/dL (ref 0–40)

## 2015-07-04 LAB — GLUCOSE, CAPILLARY
GLUCOSE-CAPILLARY: 88 mg/dL (ref 65–99)
GLUCOSE-CAPILLARY: 107 mg/dL — AB (ref 65–99)
GLUCOSE-CAPILLARY: 128 mg/dL — AB (ref 65–99)
GLUCOSE-CAPILLARY: 130 mg/dL — AB (ref 65–99)
Glucose-Capillary: 75 mg/dL (ref 65–99)
Glucose-Capillary: 94 mg/dL (ref 65–99)

## 2015-07-04 LAB — COMPREHENSIVE METABOLIC PANEL
ALBUMIN: 3.2 g/dL — AB (ref 3.5–5.0)
ALK PHOS: 48 U/L (ref 38–126)
ALT: 10 U/L — AB (ref 14–54)
ANION GAP: 8 (ref 5–15)
AST: 20 U/L (ref 15–41)
BUN: 11 mg/dL (ref 6–20)
CHLORIDE: 100 mmol/L — AB (ref 101–111)
CO2: 24 mmol/L (ref 22–32)
CREATININE: 1.45 mg/dL — AB (ref 0.44–1.00)
Calcium: 9.8 mg/dL (ref 8.9–10.3)
GFR calc non Af Amer: 33 mL/min — ABNORMAL LOW (ref >60)
GFR, EST AFRICAN AMERICAN: 39 mL/min — AB (ref >60)
GLUCOSE: 91 mg/dL (ref 65–99)
Potassium: 3.6 mmol/L (ref 3.5–5.1)
SODIUM: 132 mmol/L — AB (ref 135–145)
Total Bilirubin: 0.7 mg/dL (ref 0.3–1.2)
Total Protein: 6.7 g/dL (ref 6.5–8.1)

## 2015-07-04 LAB — CBC
HCT: 29.7 % — ABNORMAL LOW (ref 36.0–46.0)
HEMOGLOBIN: 9.5 g/dL — AB (ref 12.0–15.0)
MCH: 29.9 pg (ref 26.0–34.0)
MCHC: 32 g/dL (ref 30.0–36.0)
MCV: 93.4 fL (ref 78.0–100.0)
Platelets: 164 10*3/uL (ref 150–400)
RBC: 3.18 MIL/uL — AB (ref 3.87–5.11)
RDW: 13.6 % (ref 11.5–15.5)
WBC: 7.5 10*3/uL (ref 4.0–10.5)

## 2015-07-04 LAB — TSH: TSH: 0.21 u[IU]/mL — ABNORMAL LOW (ref 0.350–4.500)

## 2015-07-04 LAB — VITAMIN B12: Vitamin B-12: 240 pg/mL (ref 180–914)

## 2015-07-04 LAB — T4, FREE: Free T4: 1.15 ng/dL — ABNORMAL HIGH (ref 0.61–1.12)

## 2015-07-04 LAB — FOLATE: FOLATE: 14.7 ng/mL (ref >5.9)

## 2015-07-04 MED ORDER — ASPIRIN EC 81 MG PO TBEC
81.0000 mg | DELAYED_RELEASE_TABLET | Freq: Every day | ORAL | Status: DC
  Administered 2015-07-04 – 2015-07-05 (×2): 81 mg via ORAL
  Filled 2015-07-04 (×2): qty 1

## 2015-07-04 MED ORDER — VITAMIN B-12 1000 MCG PO TABS
1000.0000 ug | ORAL_TABLET | Freq: Every day | ORAL | Status: DC
  Administered 2015-07-05: 1000 ug via ORAL
  Filled 2015-07-04: qty 1

## 2015-07-04 MED ORDER — MEMANTINE HCL ER 28 MG PO CP24
28.0000 mg | ORAL_CAPSULE | Freq: Every day | ORAL | Status: DC
  Administered 2015-07-04: 28 mg via ORAL
  Filled 2015-07-04 (×3): qty 1

## 2015-07-04 MED ORDER — ENSURE ENLIVE PO LIQD
237.0000 mL | Freq: Two times a day (BID) | ORAL | Status: DC
  Administered 2015-07-05: 237 mL via ORAL
  Filled 2015-07-04 (×4): qty 237

## 2015-07-04 MED ORDER — PRAVASTATIN SODIUM 40 MG PO TABS
40.0000 mg | ORAL_TABLET | Freq: Every day | ORAL | Status: DC
  Administered 2015-07-04: 40 mg via ORAL
  Filled 2015-07-04: qty 1

## 2015-07-04 MED ORDER — DONEPEZIL HCL 5 MG PO TABS
5.0000 mg | ORAL_TABLET | Freq: Every day | ORAL | Status: DC
  Administered 2015-07-04: 5 mg via ORAL
  Filled 2015-07-04: qty 1

## 2015-07-04 NOTE — Progress Notes (Signed)
VASCULAR LAB PRELIMINARY  PRELIMINARY  PRELIMINARY  PRELIMINARY  Carotid duplex completed.    Preliminary report:  1-39% plaquing. Vertebral artery flow is antegrade.   Chaynce Schafer, RVT 07/04/2015, 2:41 PM

## 2015-07-04 NOTE — Progress Notes (Signed)
Subjective: Patient seen and examined at bedside today. She is awake, alert, and oriented x1 (only to person). Denies any fevers, chills, CP, or SOB. No other complaints.   Objective: Vital signs in last 24 hours: Filed Vitals:   07/04/15 0556 07/04/15 1100 07/04/15 1518 07/04/15 1815  BP: 108/57 131/58 108/59 123/56  Pulse: 92 92 90 92  Temp: 97.9 F (36.6 C) 98.7 F (37.1 C) 98.1 F (36.7 C) 99.6 F (37.6 C)  TempSrc: Oral Oral Oral Oral  Resp: 16 17 16 17   SpO2: 96% 97% 97% 97%   Weight change:  No intake or output data in the 24 hours ending 07/04/15 1844  Physical Exam  Constitutional: She is well-developed, well-nourished, and in no distress.  HENT:  Head: Normocephalic and atraumatic.  Eyes: EOM are normal. Pupils are equal, round, and reactive to light.  Neck: Normal range of motion. No tracheal deviation present.  Cardiovascular: Normal rate, regular rhythm, normal heart sounds and intact distal pulses.  Pulmonary/Chest: Effort normal. She has no wheezes.  Coarse expiratory sounds in anterior lung fields. No wheezes or crackles.  Abdominal: RLQ mildly tender to palpation. She exhibits no distension and abdomen is soft. There is no rebound and no guarding.  Musculoskeletal: Normal range of motion. She exhibits no edema.  Neurological: AAOx1, CN II-XII grossly intact. Strength and sensation grossly intact in b/l upper and lower extremities.  Skin: Skin is warm and dry.    Lab Results: Basic Metabolic Panel:  Recent Labs Lab 07/03/15 1616 07/03/15 1617 07/04/15 0630  NA 129* 130* 132*  K 4.3 4.1 3.6  CL 97* 95* 100*  CO2 19*  --  24  GLUCOSE 108* 111* 91  BUN 16 20 11   CREATININE 1.70* 1.60* 1.45*  CALCIUM 10.5*  --  9.8   Liver Function Tests:  Recent Labs Lab 07/03/15 1616 07/04/15 0630  AST 29 20  ALT 12* 10*  ALKPHOS 47 48  BILITOT 0.4 0.7  PROT 7.2 6.7  ALBUMIN 3.3* 3.2*   CBC:  Recent Labs Lab 07/03/15 1616 07/03/15 1617  07/04/15 0630  WBC 7.5  --  7.5  NEUTROABS 4.3  --   --   HGB 10.1* 10.9* 9.5*  HCT 31.0* 32.0* 29.7*  MCV 93.9  --  93.4  PLT 152  --  164   Cardiac Enzymes:  Recent Labs Lab 07/03/15 2256  CKTOTAL 60   CBG:  Recent Labs Lab 07/03/15 2127 07/04/15 0013 07/04/15 0404 07/04/15 0807 07/04/15 1208 07/04/15 1607  GLUCAP 88 75 88 94 107* 128*   Fasting Lipid Panel:  Recent Labs Lab 07/04/15 1141  CHOL 116  HDL 38*  LDLCALC 66  TRIG 59  CHOLHDL 3.1   Thyroid Function Tests:  Recent Labs Lab 07/04/15 1141  TSH 0.210*  FREET4 1.15*   Coagulation:  Recent Labs Lab 07/03/15 1616  LABPROT 14.0  INR 1.06   Anemia Panel:  Recent Labs Lab 07/04/15 1141  VITAMINB12 240  FOLATE 14.7   Urine Drug Screen: Drugs of Abuse  No results found for: LABOPIA, COCAINSCRNUR, LABBENZ, AMPHETMU, THCU, LABBARB  Alcohol Level:  Recent Labs Lab 07/03/15 Sanders <5    Micro Results: No results found for this or any previous visit (from the past 240 hour(s)). Studies/Results: Ct Head Wo Contrast  07/03/2015   CLINICAL DATA:  Weakness seizure, with postictal left-sided deficit.  EXAM: CT HEAD WITHOUT CONTRAST  TECHNIQUE: Contiguous axial images were obtained from the base of  the skull through the vertex without intravenous contrast.  COMPARISON:  None.  FINDINGS: No mass effect or midline shift. No evidence of acute intracranial hemorrhage, or infarction. 7 mm hypoattenuated structure within the right cerebellar may represent an old lacunar infarct. There is atrophy and chronic small vessel disease changes. No abnormal extra-axial fluid collections. Gray-white matter differentiation is normal. Basal cisterns are preserved.  No depressed skull fractures. Visualized paranasal sinuses and mastoid air cells are not opacified.  IMPRESSION: No acute intracranial abnormality.  Atrophy, chronic microvascular disease.  Probable remote right cerebellar lacunar infarct.  These  results were called by telephone at the time of interpretation on 07/03/2015 at 4:09 pm to Dr. Armida Sans , who verbally acknowledged these results.   Electronically Signed   By: Fidela Salisbury M.D.   On: 07/03/2015 16:12   Mr Jodene Nam Head Wo Contrast  07/03/2015   CLINICAL DATA:  Altered mental status. Left facial droop. Low weakness in the left upper extremity. Symptoms have since resolved. Persistent confusion with dementia at baseline.  EXAM: MRI HEAD WITHOUT CONTRAST  MRA HEAD WITHOUT CONTRAST  TECHNIQUE: Multiplanar, multiecho pulse sequences of the brain and surrounding structures were obtained without intravenous contrast. Angiographic images of the head were obtained using MRA technique without contrast.  COMPARISON:  CT head without contrast 07/03/2015.  FINDINGS: MRI HEAD FINDINGS  The diffusion-weighted images demonstrate no acute or subacute infarction. The lacunar infarct in the right cerebellum is confirmed to be remote. No acute hemorrhage or mass lesion is present. Moderate generalized atrophy and mild periventricular white matter changes are noted bilaterally. Study is moderately degraded by patient motion. Flow is present in the major intracranial arteries. The globes and orbits are intact. The paranasal sinuses and mastoid air cells are clear. Midline structures are grossly within normal limits.  MRA HEAD FINDINGS  The study is mildly degraded by patient motion. The internal carotid arteries are within normal limits from the high cervical segments through the ICA termini. The A1 and M1 segments are normal. Anterior communicating artery is patent. The MCA bifurcations are intact. ACA and MCA branch vessels are within normal limits for age.  The left vertebral artery is slightly dominant to the right. The left PICA origin is visualized and normal. The right AICA is dominant. The basilar artery is normal. Both posterior cerebral arteries originate from the basilar tip. The PCA branch vessels are  within normal limits.  IMPRESSION: 1. No acute intracranial abnormality. 2. Remote lacunar infarct of the right cerebellum. 3. Normal variant MRA circle of Willis without significant proximal stenosis, aneurysm, or branch vessel occlusion.   Electronically Signed   By: San Morelle M.D.   On: 07/03/2015 19:00   Mr Brain Wo Contrast  07/03/2015   CLINICAL DATA:  Altered mental status. Left facial droop. Low weakness in the left upper extremity. Symptoms have since resolved. Persistent confusion with dementia at baseline.  EXAM: MRI HEAD WITHOUT CONTRAST  MRA HEAD WITHOUT CONTRAST  TECHNIQUE: Multiplanar, multiecho pulse sequences of the brain and surrounding structures were obtained without intravenous contrast. Angiographic images of the head were obtained using MRA technique without contrast.  COMPARISON:  CT head without contrast 07/03/2015.  FINDINGS: MRI HEAD FINDINGS  The diffusion-weighted images demonstrate no acute or subacute infarction. The lacunar infarct in the right cerebellum is confirmed to be remote. No acute hemorrhage or mass lesion is present. Moderate generalized atrophy and mild periventricular white matter changes are noted bilaterally. Study is moderately degraded by patient motion.  Flow is present in the major intracranial arteries. The globes and orbits are intact. The paranasal sinuses and mastoid air cells are clear. Midline structures are grossly within normal limits.  MRA HEAD FINDINGS  The study is mildly degraded by patient motion. The internal carotid arteries are within normal limits from the high cervical segments through the ICA termini. The A1 and M1 segments are normal. Anterior communicating artery is patent. The MCA bifurcations are intact. ACA and MCA branch vessels are within normal limits for age.  The left vertebral artery is slightly dominant to the right. The left PICA origin is visualized and normal. The right AICA is dominant. The basilar artery is normal.  Both posterior cerebral arteries originate from the basilar tip. The PCA branch vessels are within normal limits.  IMPRESSION: 1. No acute intracranial abnormality. 2. Remote lacunar infarct of the right cerebellum. 3. Normal variant MRA circle of Willis without significant proximal stenosis, aneurysm, or branch vessel occlusion.   Electronically Signed   By: San Morelle M.D.   On: 07/03/2015 19:00   Medications: I have reviewed the patient's current medications. Scheduled Meds: . aspirin EC  81 mg Oral Daily  . donepezil  5 mg Oral QHS  . enoxaparin (LOVENOX) injection  40 mg Subcutaneous Q24H  . [START ON 07/05/2015] feeding supplement (ENSURE ENLIVE)  237 mL Oral BID BM  . insulin aspart  0-9 Units Subcutaneous 6 times per day  . levETIRAcetam  500 mg Intravenous Q12H  . memantine  28 mg Oral QHS  . pravastatin  40 mg Oral QHS  . sodium chloride  3 mL Intravenous Q12H  . vitamin B-12  1,000 mcg Oral Daily   Continuous Infusions:  PRN Meds:.acetaminophen **OR** acetaminophen, LORazepam, ondansetron **OR** ondansetron (ZOFRAN) IV Assessment/Plan: Active Problems:   Facial droop   Seizure   Tonic-clonic seizures   Postictal paralysis   Dementia in Alzheimer's disease   Essential hypertension  Ms. Humble is a 81 yo female with HTN, DMII, HLD, Alzheimers Dementia, and h/o breast cancer presenting with seizure.  Seizure: Patient has had 2 witnessed seizures with abnormal EEG. Patient has long h/o Alzheimer's dementia. No h/o trauma or evidence of stroke. No evidence of mass lesion on MRI, though patient has remote history of breast cancer.  MRI of the brain without contrast demonstrated a remote lacunar infarct in the right cerebellum with moderate generalized atrophy and mild periventricular white matter changes bilaterally. There was no evidence of acute or subacute infarction or acute hemorrhage or mass lesion.  Seizure most likely secondary to patient's underlying dementia. The  L sided paresis she experienced yesterday was most likely post-ictal in nature and consistent with abnormal EEG finding of intermittent focal slowing in the right hemisphere. Echo from today showing EF 95-63%, grade 1 diastolic dysfunction, mildly calcified mitral valve leaflets, and no regional wall motion abnormalities. No cardiac source of emboli identified. B12 within normal range. TSH low (0.210) and free T4 high (1.15).  - Neuro consult, apprecate rec's - Is patient develops focal neurological symptoms, consider MRI of brain with contrast to look for a focal mass/ lesion - Seizure precautions - Continue Keppra 500 mg q12 - Lorazepam 1 mg PRN seizure - Aspiration precautions - PT/OT - Speech/ swallow study done and regular diet recommended  - Carotid doppler pending  - Workup for hyperthyroidism can be done as outpatient. Patient is not symptomatic.   Dementia: Patient has reported h/o Alzheimers dementia, with unclear baseline. Will hold meds in  setting of new seizures. - Hold Aricept and Namenda  AKI on CKD3: Cr elevated to 1.6 on admission, with recent baseline 1-1.35. Patient has had decreased PO fluid intake, as evidenced by hyponatremia/hypochloremia. Cr trended down to 1.45 today and is expected to improve as patient has better po intake.   RLQ tenderness: patient complained of mild RLQ tenderness on abdominal exam today. The abdomen was soft and not distended. There was no rebound tenderness, rigidity, or guarding. Not likely appendicitis because patient is afebrile, not nauseous, and has a normal WBC count (7.5). -F/u UA   DMII: Last A1c 6/15, with A1c 6.4. Patient on Glipizide and Metformin at home. Will hold home meds in setting of new seizures. Can consider restart if patient's mental status improves and able to swallow pills - SSI - Hold Glipizide and Metformin  HTN: BPs normal since arrival to ED. One elevated reading was during seizure. Will hold meds, monitor BP, and  consider restart when patient able to take PO meds. BP 123/56 today.  - Hold Lisinopril and Metop-HCTZ  HLD - Hold Pravastatin  PNA: Last dose of Levofloxacin today. Patient is afebrile and saturating 97% on RA.    H/o DVT: LE dopplers at previous admission demonstrated right peroneal DVT, untreated during that admission. Patient has no pulmonary complaints or calf pain. Doppler today did not show any evidence of DVT or superficial thrombosis of the left and right lower extremities.   H/o Breast cancer: Unknown subtype. Patient s/p lumpectomy and radiation. Her oncologist has signed off.   FEN/GI: -regular diet   DVT ppx: Lovenox   Dispo: Disposition is deferred at this time, awaiting improvement of current medical problems.  Anticipated discharge in approximately 1-2 day(s).   The patient does have a current PCP Birdie Sons, MD) and does need an [facility] hospital follow-up appointment after discharge.  The patient does not have transportation limitations that hinder transportation to clinic appointments.  .Services Needed at time of discharge: Y = Yes, Blank = No PT:   OT:   RN:   Equipment:   Other:     LOS: 1 day   Shela Leff, MD 07/04/2015, 6:44 PM

## 2015-07-04 NOTE — Progress Notes (Signed)
Echocardiogram 2D Echocardiogram has been performed.  Briana Austin 07/04/2015, 10:54 AM

## 2015-07-04 NOTE — Progress Notes (Signed)
VASCULAR LAB PRELIMINARY  PRELIMINARY  PRELIMINARY  PRELIMINARY  Bilateral lower extremity venous duplex completed.    Preliminary report:  There is no DVT or SVT noted in the bilateral lower extremities.   Shereta Crothers, RVT 07/04/2015, 2:42 PM

## 2015-07-04 NOTE — Evaluation (Addendum)
Clinical/Bedside Swallow Evaluation Patient Details  Name: Briana Austin MRN: [id number] Date of Birth: 1934-03-16  Today's Date: 07/04/2015 Time: SLP Start Time (ACUTE ONLY): 0840 SLP Stop Time (ACUTE ONLY): 0912 SLP Time Calculation (min) (ACUTE ONLY): 32 min  Past Medical History:  Past Medical History  Diagnosis Date  . History of breast cancer   . Dementia   . Diabetes mellitus without complication   . Hypertension   . Cancer    Past Surgical History:  Past Surgical History  Procedure Laterality Date  . Sigmoid polyp excision  11/24/2004    Hyperplastic polyp  . Breast lumpectomy Right 2005    Carcinoma in situ of right breast  . Total abdominal hysterectomy w/ bilateral salpingoophorectomy  1981    Benign   HPI:  81 yo female adm to [facility] with left facial droop, decreased responsiveness - with pt becoming stiff and eyes rolling.  CXR  shows right lobe pna improved, MRI with remote RT cerebellum CVA.  Pt PMH + for dementia, breast cancer.  Swallow evaluation reordered although pt eval was negative at [facility] last month.     Assessment / Plan / Recommendation Clinical Impression  Pt continues with functional swallowing abilities at bedside with no overt s/s of aspiration with intake pt would accept.  Family reports continuing poor appetite and pt reports food tastes bad - she denies dysphagia or hiccups contributing to poor intake.  Oral  motor strength and coordination was adequate for all consistencies with no apparent weakness. Oral phase was adequate for solid crackers with min to no oral residue after the swallow. Minimal left anterior labial spillage of thin water via cup noted - which pt states is baseline.   Again, vocal quality remained clear and laryngeal elevation adequate.    Pt appears with hiccups - attempted to palpate diaphragm to feel spasm but unable to identify for certain.  There was no gagging/spitting or coughing during all intake *yogurt,  applesauce, cracker and water.    Risk of discoordination of swallow/breathing with hiccups contributes to aspiration risk and advised family to allow pt frequent rest breaks as needed.  Further advised to consider medication with puree if indicated but family states pt does better with liquids.  Also advised pt to be in fully upright positioning during and after meals.     Pt denies choking on intake, productive hiccups nor reflux symptoms.  Noted mere mention of hiccups appeared to initiaite presentation.  Question if pt would benefit from consideration for possible medicine management of hiccups if indicated.    Of note, daughter reports pt's hiccups started during admit to [facility] last month and has not improved or worsened.  She reports they occur always with intake but can occur without po consumption.  Daughter also reports pt has always belched AFTER meals her entire life.     Aspiration Risk  Mild    Diet Recommendation Age appropriate regular solids   Medication Administration: Whole meds with liquid    Other  Recommendations Oral Care Recommendations: Oral care BID   Follow Up Recommendations       Frequency and Duration     n/a   Pertinent Vitals/Pain Afebrile, decreased      Swallow Study Prior Functional Status   see [place] Date of Onset: 06/08/15 Other Pertinent Information: 81 yo female adm to [facility] with left facial droop, decreased responsiveness - with pt becoming stiff and eyes rolling.  CXR  shows right  lobe pna improved, MRI with remote RT cerebellum CVA.  Pt PMH + for dementia, breast cancer.  Swallow evaluation reordered although pt eval was negative at [facility] last month.   Type of Study: Bedside swallow evaluation Diet Prior to this Study: NPO Temperature Spikes Noted: No Respiratory Status: Room air History of Recent Intubation: No Behavior/Cognition: Alert;Cooperative;Other (Comment) (dementia at baseline) Oral Cavity - Dentition:  Adequate natural dentition/normal for age;Missing dentition Self-Feeding Abilities: Able to feed self Patient Positioning: Upright in bed Baseline Vocal Quality: Normal Volitional Cough: Strong Volitional Swallow: Able to elicit    Oral/Motor/Sensory Function Overall Oral Motor/Sensory Function: Appears within functional limits for tasks assessed Labial ROM: Within Functional Limits Labial Symmetry: Within Functional Limits Labial Strength: Reduced (decreased labial seal on left with intake of liquids) Labial Sensation: Within Functional Limits Lingual ROM: Within Functional Limits Lingual Symmetry: Within Functional Limits Lingual Strength: Within Functional Limits Lingual Sensation: Within Functional Limits Facial ROM: Within Functional Limits Facial Symmetry: Within Functional Limits Facial Strength: Within Functional Limits Facial Sensation: Within Functional Limits Mandible: Within Functional Limits   Ice Chips Ice chips: Not tested Other Comments: pt reports she does not like ice   Thin Liquid Thin Liquid: Impaired Presentation: Cup;Self Fed;Straw Oral Phase Functional Implications: Left anterior spillage (minimal anterior spillage with water- pt reports this to be at baseline)    Nectar Thick Nectar Thick Liquid: Not tested   Honey Thick Honey Thick Liquid: Not tested   Puree Puree: Within functional limits Presentation: Self Fed;Spoon   Solid   GO    Solid: Impaired Presentation: Self Fed Other Comments: slow but effective mastication       Claudie Fisherman, [facility] SLP [phone]

## 2015-07-04 NOTE — Progress Notes (Signed)
Physical Therapy Evaluation Patient Details Name: Briana Austin MRN: [id number] DOB: 1933/11/13 Today's Date: 07/04/2015   History of Present Illness  Pt is a 81 y.o. female presenting with weakness and 102.7 temperature.  Pt admitted with community-acquired PNA (R LL PNA) and possible diverticulitis seen on abdominal CT.  Clinical Impression  Pt admitted with the above diagnosis. Pt currently with functional limitations due to the deficits listed below (see PT Problem List). Per family, patient was ambulatory without an assistive device, participating with HHPT until earlier this week when she became unstable, requiring a walker for balance. Today, pt requires min assist for balance, verbal cues for directions and sequencing. Family very supportive and wish to provide 24/7 assist at home with continued HHPT to further progression with safe mobility. Pt will benefit from skilled PT to increase their independence and safety with mobility to allow discharge to the venue listed below.       Follow Up Recommendations Home health PT;Supervision/Assistance - 24 hour    Equipment Recommendations  None recommended by PT    Recommendations for Other Services       Precautions / Restrictions Precautions Precautions: Fall Restrictions Weight Bearing Restrictions: No      Mobility  Bed Mobility Overal bed mobility: Needs Assistance Bed Mobility: Supine to Sit     Supine to sit: Supervision;HOB elevated     General bed mobility comments: Supervision for safety. Requires extra time. Denies dizziness. No assist required with HOB elevated  Transfers Overall transfer level: Needs assistance Equipment used: Rolling walker (2 wheeled);None Transfers: Sit to/from [employer] to Stand: Min guard;Min assist Stand pivot transfers: Min guard;Min assist       General transfer comment: min guard for safety   Ambulation/Gait Ambulation/Gait assistance: Min  assist Ambulation Distance (Feet): 80 Feet Assistive device: 1 person hand held assist (IV pole) Gait Pattern/deviations: Step-through pattern;Decreased stride length;Drifts right/left;Wide base of support;Trunk flexed;Staggering right;Staggering left Gait velocity: slow Gait velocity interpretation: <1.8 ft/sec, indicative of risk for recurrent falls General Gait Details: Min assist with hand held support for balance, using free hand to hold IV pole. Some staggering noted. VC for upright posture, awareness of drifting, and directions for sequencing with turns.  Stairs            Wheelchair Mobility    Modified Rankin (Stroke Patients Only) Modified Rankin (Stroke Patients Only) Pre-Morbid Rankin Score: Moderate disability Modified Rankin: Moderately severe disability     Balance Overall balance assessment: Needs assistance Sitting-balance support: Feet supported Sitting balance-Leahy Scale: Good     Standing balance support: During functional activity Standing balance-Leahy Scale: Good                               Pertinent Vitals/Pain Pain Assessment: Faces Pain Score: 10-Worst pain ever Faces Pain Scale: Hurts little more Pain Location: headache Pain Descriptors / Indicators: Aching;Headache Pain Intervention(s): Monitored during session    Home Living Family/patient expects to be discharged to:: Private residence Living Arrangements: Spouse/significant other Available Help at Discharge: Family;Available 24 hours/day;Other (Comment) ([place] aide 3x/week for bathing) Type of Home: House Home Access: Stairs to enter Entrance Stairs-Rails: None Entrance Stairs-Number of Steps: 1 Home Layout: One level Home Equipment: Walker - 2 wheels;Tub bench      Prior Function Level of Independence: Needs assistance   Gait / Transfers Assistance Needed: Has been ambulating unassisted until earlier this week when HHPT recommended use  of RW for gait  ADL's /  Homemaking Assistance Needed: Dress self, assist for bath.  Comments: Pt was mod I with ADLs prior to previous hosptialization.  Currently, she has [facility] 3x/week who assists with ADlLs     Hand Dominance   Dominant Hand: Right    Extremity/Trunk Assessment   Upper Extremity Assessment: Overall WFL for tasks assessed           Lower Extremity Assessment: Defer to PT evaluation         Communication   Communication: No difficulties  Cognition Arousal/Alertness: Awake/alert Behavior During Therapy: Flat affect Overall Cognitive Status: History of cognitive impairments - at baseline                      General Comments General comments (skin integrity, edema, etc.): family present during ADLs     Exercises        Assessment/Plan    PT Assessment Patient needs continued PT services  PT Diagnosis Difficulty walking;Generalized weakness;Abnormality of gait;Acute pain   PT Problem List Decreased strength;Decreased activity tolerance;Decreased balance;Decreased mobility;Decreased coordination;Pain  PT Treatment Interventions     PT Goals (Current goals can be found in the Care Plan section) Acute Rehab PT Goals Patient Stated Goal: to regain independence PT Goal Formulation: With patient/family Time For Goal Achievement: 07/18/15 Potential to Achieve Goals: Good    Frequency Min 4X/week   Barriers to discharge        Co-evaluation               End of Session Equipment Utilized During Treatment: Gait belt Activity Tolerance: Patient tolerated treatment well Patient left: in chair;with call bell/phone within reach;with chair alarm set;with family/visitor present Nurse Communication: Mobility status         Time: 1222-1244 PT Time Calculation (min) (ACUTE ONLY): 22 min   Charges:   PT Evaluation $Initial PT Evaluation Tier I: 1 Procedure     PT G CodesEllouise Newer 07/04/2015, 2:26 PM Camille Bal [place],  [place]

## 2015-07-04 NOTE — Progress Notes (Signed)
STROKE TEAM PROGRESS NOTE   SUBJECTIVE (INTERVAL HISTORY) Her husband and daughter are at the bedside. Overall she feels her condition is stable. They are concerned about her shaking episode at presentation. They report it occurred again last night.   Family stated that she has episode of both arm stiff and shaking with eyes rolling back yesterday afternoon lasting 2-3 min and then returned to baseline but happened with similar episode last night lasting 3-4 min. Pt was put on keppra and this am she sleepy but easily arousable. No seizure in the past, no tongue biting or b/b dysfunction.   Pt has hx of dementia, on aricept and namenda.    OBJECTIVE Temp:  [97.1 F (36.2 C)-98.6 F (37 C)] 97.9 F (36.6 C) (09/01 0556) Pulse Rate:  [78-105] 92 (09/01 0556) Cardiac Rhythm:  [-] Normal sinus rhythm (09/01 0700) Resp:  [13-19] 16 (09/01 0556) BP: (99-158)/(50-72) 108/57 mmHg (09/01 0556) SpO2:  [95 %-100 %] 96 % (09/01 0556)  CBC:  Recent Labs Lab 07/03/15 1616 07/03/15 1617 07/04/15 0630  WBC 7.5  --  7.5  NEUTROABS 4.3  --   --   HGB 10.1* 10.9* 9.5*  HCT 31.0* 32.0* 29.7*  MCV 93.9  --  93.4  PLT 152  --  144    Basic Metabolic Panel:  Recent Labs Lab 07/03/15 1616 07/03/15 1617 07/04/15 0630  NA 129* 130* 132*  K 4.3 4.1 3.6  CL 97* 95* 100*  CO2 19*  --  24  GLUCOSE 108* 111* 91  BUN 16 20 11   CREATININE 1.70* 1.60* 1.45*  CALCIUM 10.5*  --  9.8    Lipid Panel:    Component Value Date/Time   CHOL 187 09/14/2014   TRIG 132 09/14/2014   HDL 42 09/14/2014   LDLCALC 119 09/14/2014   HgbA1c:  Lab Results  Component Value Date   HGBA1C 6.4 04/17/2015   Urine Drug Screen: No results found for: LABOPIA, COCAINSCRNUR, LABBENZ, AMPHETMU, THCU, LABBARB    IMAGING  I have personally reviewed the radiological images below and agree with the radiology interpretations.  Ct Head Wo Contrast 07/03/2015 No acute intracranial abnormality. Atrophy, chronic  microvascular disease. Probable remote right cerebellar lacunar infarct.   Mr Jodene Nam Head Wo Contrast 07/03/2015 Normal variant MRA circle of Willis without significant proximal stenosis, aneurysm, or branch vessel occlusion.   Mr Brain Wo Contrast 07/03/2015 1. No acute intracranial abnormality. 2. Remote lacunar infarct of the right cerebellum.   CUS - Bilateral: 1-39% ICA stenosis. Vertebral artery flow is antegrade.  LE venous doppler - no DVT  2D echo - Left ventricle: The cavity size was normal. Wall thickness was normal. Systolic function was normal. The estimated ejection fraction was in the range of 60% to 65%. Wall motion was normal; there were no regional wall motion abnormalities. Doppler parameters are consistent with abnormal left ventricular relaxation (grade 1 diastolic dysfunction). - Mitral valve: Calcified annulus. Mildly thickened, mildly calcified leaflets . Impressions: - no cardiac source of emboli was indentified.  EEG 07/03/15- This is an abnormal EEG secondary to intermittent focal slowing in the hemisphere region on the right. This finding is suggestive of a focal disturbance that is etiologically nonspecific, but may include a mass lesion among other possibilities.   PHYSICAL EXAM  Temp:  [97.1 F (36.2 C)-98.7 F (37.1 C)] 98.1 F (36.7 C) (09/01 1518) Pulse Rate:  [78-105] 90 (09/01 1518) Resp:  [13-19] 16 (09/01 1518) BP: (99-158)/(50-72) 108/59 mmHg (09/01 1518)  SpO2:  [95 %-100 %] 97 % (09/01 1518)  General - Well nourished, well developed, in no apparent distress, sleepy but easily arousable.  Ophthalmologic - Fundi not visualized due to noncooperation.  Cardiovascular - Regular rate and rhythm.  Mental Status -  Level of arousal and orientation to place, and person were intact, but not orientated to time. Language including expression, repetition, comprehension was assessed and found intact, naming 3 out of 4. Fund of  Knowledge was assessed and was impaired.  Cranial Nerves II - XII - II - Visual field intact OU. III, IV, VI - Extraocular movements intact. V - Facial sensation intact bilaterally. VII - Facial movement intact bilaterally. VIII - Hearing & vestibular intact bilaterally. X - Palate elevates symmetrically. XI - Chin turning & shoulder shrug intact bilaterally. XII - Tongue protrusion intact.  Motor Strength - The patient's strength was normal in all extremities and pronator drift was absent.  Bulk was normal and fasciculations were absent.   Motor Tone - Muscle tone was assessed at the neck and appendages and was normal.  Reflexes - The patient's reflexes were 1+ in all extremities and she had no pathological reflexes.  Sensory - Light touch, temperature/pinprick were assessed and were symmetrical.    Coordination - The patient had normal movements in the hands with no ataxia or dysmetria.  Tremor was absent  Gait and Station - deferred due to safety concerns.   ASSESSMENT/PLAN Ms. LURENE ROBLEY is a 81 y.o. female with history of HTN, DMII, HLD, Alzheimers Dementia and h/o breast cancer presenting after a witnessed unresponsive, shaking episode. She did not receive IV t-PA due to resolved symptoms. Similar episode occurred again last night. Put on keppra.   Seizure - unclear etiology, could be multifactorial including hyperthyroidism, hyponatremia, worsening Cre, relatively low B12, recent pneumonia, and dementia. However, due to recurrence of seizure episode with abnormal EEG, recommend AEDs.   EEG focal slowing R hemisphere   On Keppra 500 mg bid (loaded with 1000 mg). Continue keppra on discharge.  MRI No acute stroke  MRA Unremarkable   Carotid Doppler unremarkable  2D Echo  unremarkable   Lovenox 40 mg sq daily for VTE prophylaxis  Diet regular Room service appropriate?: Yes; Fluid consistency:: Thin  aspirin 81 mg orally every day prior to admission, now on  no antithrombotic. Resumed home ASA 81mg .  UA negative  Recent CXR with resolving PNA. Recommend to repeat CXR to evaluate PNA resolution.  B12 240, goal > 500. Ordered B12 1078mcg daily.  Low TSH and high FT4. May need to repeat and endo consult. Although hashimoto encephalitis mostly associated with hypothyroidism, agree with check thyroid antibodies.   Dementia  On aricept and namenda at home  Resumed home aricept and namenda  Essential Hypertension  Stable  Home meds - lisinopril  Hyperlipidemia  Home meds: pravachol 40, resumed on admission  LDL 66, on the goal < 70  Continue statin at discharge  Diabetes type II  HgbA1c pending, at goal < 7.0  Controlled  Other Active Problems  Hx breast cancer 10 years ago, cured  Recent hospitalization in July for [facility] day # 1  Rosalin Hawking, MD PhD Stroke Neurology 07/04/2015 4:36 PM     To contact Stroke Continuity provider, please refer to [email or web address] After hours, contact General Neurology

## 2015-07-04 NOTE — Evaluation (Signed)
Occupational Therapy Evaluation Patient Details Name: Briana Austin MRN: [id number] DOB: [birth year] Today's Date: 07/04/2015    History of Present Illness Pt is a 81 y.o. female presenting with weakness and 102.7 temperature.  Pt admitted with community-acquired PNA (R LL PNA) and possible diverticulitis seen on abdominal CT.   Clinical Impression   .Pt is admitted with above.  She is close to her preadmission baseline with exception of feeling sluggish per pt and family.  She currently requires min A for ADLs.  Prior to previous admission for PNA, she was Independent with ADLs.  Feel she would benefit from [facility] to address independence with ADLs.   No acute OT needs identified.  Will sign off.     Follow Up Recommendations  Home health OT;Supervision/Assistance - 24 hour    Equipment Recommendations  None recommended by OT    Recommendations for Other Services       Precautions / Restrictions Precautions Precautions: Fall Restrictions Weight Bearing Restrictions: No      Mobility Bed Mobility                  Transfers Overall transfer level: Needs assistance Equipment used: Rolling walker (2 wheeled);None Transfers: Sit to/from [employer] to Stand: Min guard;Min assist Stand pivot transfers: Min guard;Min assist       General transfer comment: min guard for safety     Balance Overall balance assessment: Needs assistance Sitting-balance support: Feet supported Sitting balance-Leahy Scale: Good     Standing balance support: During functional activity Standing balance-Leahy Scale: Good                              ADL Overall ADL's : Needs assistance/impaired Eating/Feeding: Set up;Sitting Eating/Feeding Details (indicate cue type and reason): poor appetite Grooming: Wash/dry hands;Wash/dry face;Oral care;Brushing hair;Min guard;Standing   Upper Body Bathing: Set up;Supervision/ safety;Sitting   Lower Body Bathing:  Minimal assistance;Sit to/from stand   Upper Body Dressing : Minimal assistance;Sitting   Lower Body Dressing: Minimal assistance;Sit to/from stand   Toilet Transfer: Min guard;Ambulation;Comfort height toilet;RW   Toileting- Water quality scientist and Hygiene: Min guard;Sit to/from stand       Functional mobility during ADLs: Min guard;Rolling walker General ADL Comments: Pt with flat affect.  Poor initiation      Vision Additional Comments: Pt does not have glasses present at this time.  With effort, she is able to read clock on wall.  She demonstrates difficulty participating in formal visual assessment.  EOMs full.  Fields grossly [facility]    Perception     Praxis      Pertinent Vitals/Pain Pain Assessment: Faces Pain Score: 10-Worst pain ever Faces Pain Scale: Hurts little more Pain Location: headache Pain Descriptors / Indicators: Aching;Headache Pain Intervention(s): Monitored during session     Hand Dominance Right   Extremity/Trunk Assessment Upper Extremity Assessment Upper Extremity Assessment: Overall WFL for tasks assessed   Lower Extremity Assessment Lower Extremity Assessment: Defer to PT evaluation       Communication Communication Communication: No difficulties   Cognition Arousal/Alertness: Awake/alert Behavior During Therapy: Flat affect Overall Cognitive Status: History of cognitive impairments - at baseline                     General Comments       Exercises       Shoulder Instructions      Home Living Family/patient expects to  be discharged to:: Private residence Living Arrangements: Spouse/significant other Available Help at Discharge: Family;Available 24 hours/day;Other (Comment) ([place] aide 3x/week for bathing) Type of Home: House Home Access: Stairs to enter [employer] of Steps: 1 Entrance Stairs-Rails: None Home Layout: One level     Bathroom Shower/Tub: Tub/shower unit;Curtain Shower/tub characteristics:  Architectural technologist: Standard     Home Equipment: Environmental consultant - 2 wheels;Tub bench          Prior Functioning/Environment Level of Independence: Needs assistance  Gait / Transfers Assistance Needed: Has been ambulating unassisted until earlier this week when HHPT recommended use of RW for gait ADL's / Homemaking Assistance Needed: Dress self, assist for bath.   Comments: Pt was mod I with ADLs prior to previous hosptialization.  Currently, she has [facility]ide 3x/week who assists with ADlLs    OT Diagnosis: Generalized weakness;Cognitive deficits;Acute pain   OT Problem List: Decreased strength;Decreased activity tolerance;Impaired balance (sitting and/or standing);Decreased safety awareness   OT Treatment/Interventions: Self-care/ADL training;DME and/or AE instruction;Therapeutic activities;Patient/family education;Balance training    OT Goals(Current goals can be found in the care plan section) Acute Rehab OT Goals Patient Stated Goal: to regain independence OT Goal Formulation: All assessment and education complete, DC therapy  OT Frequency:     Barriers to D/C:            Co-evaluation              End of Session Equipment Utilized During Treatment: Rolling walker Nurse Communication: Mobility status  Activity Tolerance: Patient tolerated treatment well Patient left: in chair;with call bell/phone within reach;with chair alarm set;with family/visitor present   Time: 9166-0600 OT Time Calculation (min): 34 min Charges:  OT General Charges $OT Visit: 1 Procedure OT Evaluation $Initial OT Evaluation Tier I: 1 Procedure OT Treatments $Self Care/Home Management : 8-22 mins G-Codes:    Caragh Gasper M July 10, 2015, 1:38 PM

## 2015-07-04 NOTE — Progress Notes (Signed)
Utilization review completed. Giliana Vantil, RN, BSN. 

## 2015-07-05 ENCOUNTER — Inpatient Hospital Stay (HOSPITAL_COMMUNITY)
Admit: 2015-07-05 | Discharge: 2015-07-05 | Disposition: A | Discharge status: Home / Self Care | Payer: Commercial Managed Care - HMO | Attending: Neurology | Admitting: Neurology

## 2015-07-05 ENCOUNTER — Telehealth: Payer: Self-pay | Admitting: Family Medicine

## 2015-07-05 DIAGNOSIS — R569 Unspecified convulsions: Secondary | ICD-10-CM | POA: Insufficient documentation

## 2015-07-05 DIAGNOSIS — F0280 Dementia in other diseases classified elsewhere without behavioral disturbance: Secondary | ICD-10-CM

## 2015-07-05 DIAGNOSIS — G40309 Generalized idiopathic epilepsy and epileptic syndromes, not intractable, without status epilepticus: Secondary | ICD-10-CM

## 2015-07-05 DIAGNOSIS — G8384 Todd's paralysis (postepileptic): Secondary | ICD-10-CM

## 2015-07-05 DIAGNOSIS — G309 Alzheimer's disease, unspecified: Secondary | ICD-10-CM

## 2015-07-05 DIAGNOSIS — E0781 Sick-euthyroid syndrome: Secondary | ICD-10-CM | POA: Insufficient documentation

## 2015-07-05 DIAGNOSIS — R29810 Facial weakness: Secondary | ICD-10-CM

## 2015-07-05 DIAGNOSIS — I10 Essential (primary) hypertension: Secondary | ICD-10-CM

## 2015-07-05 DIAGNOSIS — R55 Syncope and collapse: Secondary | ICD-10-CM

## 2015-07-05 LAB — RENAL FUNCTION PANEL
ALBUMIN: 3 g/dL — AB (ref 3.5–5.0)
ANION GAP: 8 (ref 5–15)
BUN: 6 mg/dL (ref 6–20)
CALCIUM: 9.6 mg/dL (ref 8.9–10.3)
CO2: 24 mmol/L (ref 22–32)
Chloride: 100 mmol/L — ABNORMAL LOW (ref 101–111)
Creatinine, Ser: 1.23 mg/dL — ABNORMAL HIGH (ref 0.44–1.00)
GFR calc non Af Amer: 41 mL/min — ABNORMAL LOW (ref >60)
GFR, EST AFRICAN AMERICAN: 47 mL/min — AB (ref >60)
GLUCOSE: 114 mg/dL — AB (ref 65–99)
PHOSPHORUS: 2.9 mg/dL (ref 2.5–4.6)
POTASSIUM: 3.4 mmol/L — AB (ref 3.5–5.1)
SODIUM: 132 mmol/L — AB (ref 135–145)

## 2015-07-05 LAB — GLUCOSE, CAPILLARY
GLUCOSE-CAPILLARY: 113 mg/dL — AB (ref 65–99)
GLUCOSE-CAPILLARY: 138 mg/dL — AB (ref 65–99)
Glucose-Capillary: 107 mg/dL — ABNORMAL HIGH (ref 65–99)
Glucose-Capillary: 112 mg/dL — ABNORMAL HIGH (ref 65–99)
Glucose-Capillary: 144 mg/dL — ABNORMAL HIGH (ref 65–99)

## 2015-07-05 LAB — CBC
HCT: 27.7 % — ABNORMAL LOW (ref 36.0–46.0)
Hemoglobin: 8.9 g/dL — ABNORMAL LOW (ref 12.0–15.0)
MCH: 30.1 pg (ref 26.0–34.0)
MCHC: 32.1 g/dL (ref 30.0–36.0)
MCV: 93.6 fL (ref 78.0–100.0)
PLATELETS: 158 10*3/uL (ref 150–400)
RBC: 2.96 MIL/uL — AB (ref 3.87–5.11)
RDW: 13.8 % (ref 11.5–15.5)
WBC: 5 10*3/uL (ref 4.0–10.5)

## 2015-07-05 LAB — HEMOGLOBIN A1C
HEMOGLOBIN A1C: 6.7 % — AB (ref 4.8–5.6)
Mean Plasma Glucose: 146 mg/dL

## 2015-07-05 LAB — RETICULOCYTES
RBC.: 2.96 MIL/uL — AB (ref 3.87–5.11)
RETIC CT PCT: 1.4 % (ref 0.4–3.1)
Retic Count, Absolute: 41.4 10*3/uL (ref 19.0–186.0)

## 2015-07-05 LAB — IRON AND TIBC
Iron: 60 ug/dL (ref 28–170)
SATURATION RATIOS: 19 % (ref 10.4–31.8)
TIBC: 309 ug/dL (ref 250–450)
UIBC: 249 ug/dL

## 2015-07-05 LAB — TECHNOLOGIST SMEAR REVIEW

## 2015-07-05 LAB — FERRITIN: Ferritin: 69 ng/mL (ref 11–307)

## 2015-07-05 MED ORDER — CYANOCOBALAMIN 1000 MCG PO TABS: 1000.0000 ug | ORAL_TABLET | Freq: Every day | ORAL | Status: DC

## 2015-07-05 MED ORDER — POTASSIUM CHLORIDE CRYS ER 20 MEQ PO TBCR
40.0000 meq | EXTENDED_RELEASE_TABLET | Freq: Once | ORAL | Status: AC
  Administered 2015-07-05: 40 meq via ORAL
  Filled 2015-07-05: qty 2

## 2015-07-05 MED ORDER — METOPROLOL-HYDROCHLOROTHIAZIDE 100-50 MG PO TABS: 1.0000 | ORAL_TABLET | Freq: Every day | ORAL | Status: DC

## 2015-07-05 MED ORDER — LEVETIRACETAM 500 MG PO TABS: 500.0000 mg | ORAL_TABLET | Freq: Two times a day (BID) | ORAL | Status: DC

## 2015-07-05 MED ORDER — HYDROCHLOROTHIAZIDE 50 MG PO TABS
50.0000 mg | ORAL_TABLET | Freq: Every day | ORAL | Status: DC
  Administered 2015-07-05: 50 mg via ORAL
  Filled 2015-07-05: qty 1
  Filled 2015-07-05: qty 2

## 2015-07-05 MED ORDER — METOPROLOL TARTRATE 100 MG PO TABS
100.0000 mg | ORAL_TABLET | Freq: Every day | ORAL | Status: DC
  Administered 2015-07-05: 100 mg via ORAL
  Filled 2015-07-05: qty 1

## 2015-07-05 MED ORDER — LISINOPRIL 20 MG PO TABS
40.0000 mg | ORAL_TABLET | Freq: Every day | ORAL | Status: DC
  Administered 2015-07-05: 40 mg via ORAL
  Filled 2015-07-05: qty 2

## 2015-07-05 NOTE — Progress Notes (Signed)
Patient discharged home with husband and daughter. Patient's husband and daughter vocalized understanding of discharge instructions including medications, follow up appointment. Patient escorted by wheelchair by RN to car with husband and daughter. Discharge instructions given to husband. Neuro assessment unchanged, VSS.

## 2015-07-05 NOTE — Telephone Encounter (Signed)
FYI

## 2015-07-05 NOTE — Progress Notes (Signed)
Physical Therapy Treatment Patient Details Name: Briana Austin MRN: [id number] DOB: 07/15/1934 Today's Date: 07/05/2015    History of Present Illness [name] is a 81 yo female with HTN, DMII, HLD, Alzheimers Dementia and h/o breast cancer, presenting as code stroke. MRI suggests no acute stroke.    EEG focal slowing R hemisphere - seizure.    PT Comments    Good progress towards functional goals. Improved balance with gait today, using a RW to ambulate. HR elevated to 141 with gait, pt was asymptomatic, resting HR 110. Strong family support, eager for patient to return home with physical therapy. Adequate from a mobility standpoint for discharge, when medically stable.   Follow Up Recommendations  Home health PT;Supervision/Assistance - 24 hour (requests [facility] as she was already receiving PT)     Equipment Recommendations  None recommended by PT    Recommendations for Other Services       Precautions / Restrictions Precautions Precautions: Fall Restrictions Weight Bearing Restrictions: No    Mobility  Bed Mobility Overal bed mobility: Needs Assistance Bed Mobility: Supine to Sit     Supine to sit: [facility] elevated;Min assist     General bed mobility comments: Min assist for pt to pull through PTs hand. Cues for technique.  Transfers Overall transfer level: Needs assistance Equipment used: Rolling walker (2 wheeled) Transfers: Sit to/from Stand Sit to Stand: Min assist         General transfer comment: Min assist for boost. Good stability upon standing with hands on RW for support. VC for hand placement  Ambulation/Gait Ambulation/Gait assistance: Min guard Ambulation Distance (Feet): 100 Feet Assistive device: Rolling walker (2 wheeled) Gait Pattern/deviations: Step-through pattern;Decreased stride length;Trunk flexed;Drifts right/left Gait velocity: slow Gait velocity interpretation: Below normal speed for age/gender General Gait Details: Close  guard for safety. Drifting left but able to self-correct with verbal cues. A couple instances of lifting RW and tilting left specifically when distraced and turning head, but corrects with VC for awareness.  HR elevated to 141, asymptomatic. RN notified.   Stairs            Wheelchair Mobility    Modified Rankin (Stroke Patients Only) Modified Rankin (Stroke Patients Only) Pre-Morbid Rankin Score: Moderate disability Modified Rankin: Moderately severe disability     Balance                                    Cognition Arousal/Alertness: Awake/alert Behavior During Therapy: WFL for tasks assessed/performed Overall Cognitive Status: History of cognitive impairments - at baseline                      Exercises      General Comments General comments (skin integrity, edema, etc.): Family present again today. Requests continued HHPT with [facility].       Pertinent Vitals/Pain Pain Assessment: No/denies pain Pain Intervention(s): Monitored during session    Home Living                      Prior Function            PT Goals (current goals can now be found in the care plan section) Acute Rehab PT Goals Patient Stated Goal: None stated PT Goal Formulation: With patient/family Time For Goal Achievement: 07/18/15 Potential to Achieve Goals: Good Progress towards PT goals: Progressing toward goals    Frequency  Min 4X/week    PT Plan Current plan remains appropriate    Co-evaluation             End of Session Equipment Utilized During Treatment: Gait belt Activity Tolerance: Patient tolerated treatment well Patient left: in chair;with call bell/phone within reach;with family/visitor present     Time: 6415-8309 PT Time Calculation (min) (ACUTE ONLY): 12 min  Charges:  $Gait Training: 8-22 mins                    G Codes:      Ellouise Newer 03-Aug-2015, 2:08 PM Camille Bal [place], [place]

## 2015-07-05 NOTE — Discharge Instructions (Signed)
New medications:  Keppra 500 mg: take 1 tablet by mouth twice daily  Vitamin B12 (1000 mcg): take 1 tablet by mouth daily     Please go to your appointment with your primary care physician:  Dr. Caryn Section  Appointment on 07/10/15 at 4:15 pm.   [place] [address] [phone]   Please schedule an appointment with [facility] within 1 week.  [address] [place] [address] [phone]     Seizure, Adult A seizure means there is unusual activity in the brain. A seizure can cause changes in attention or behavior. Seizures often cause shaking (convulsions). Seizures often last from 30 seconds to 2 minutes. HOME CARE   If you are given medicines, take them exactly as told by your doctor.  Keep all doctor visits as told.  Do not swim or drive until your doctor says it is okay.  Teach others what to do if you have a seizure. They should:  Lay you on the ground.  Put a cushion under your head.  Loosen any tight clothing around your neck.  Turn you on your side.  Stay with you until you get better. GET HELP RIGHT AWAY IF:   The seizure lasts longer than 2 to 5 minutes.  The seizure is very bad.  The person does not wake up after the seizure.  The person's attention or behavior changes. Drive the person to the emergency room or call your local emergency services (911 in [place] MAKE SURE YOU:   Understand these instructions.  Will watch your condition.  Will get help right away if you are not doing well or get worse. Document Released: 04/06/2008 Document Revised: 01/11/2012 Document Reviewed: 10/07/2011 [facility] Patient Information 2015 [place], [place]. This information is not intended to replace advice given to you by your health care provider. Make sure you discuss any questions you have with your health care provider.  STROKE/TIA DISCHARGE INSTRUCTIONS SMOKING Cigarette smoking  nearly doubles your risk of having a stroke & is the single most alterable risk factor  If you smoke or have smoked in the last 12 months, you are advised to quit smoking for your health.  Most of the excess cardiovascular risk related to smoking disappears within a year of stopping.  Ask you doctor about anti-smoking medications  [place] Quit Line: 1-800-QUIT NOW  Free Smoking Cessation Classes [phone]  CHOLESTEROL Know your levels; limit fat & cholesterol in your diet  Lipid Panel     Component Value Date/Time   CHOL 116 07/04/2015 1141   CHOL 187 09/14/2014   TRIG 59 07/04/2015 1141   TRIG 132 09/14/2014   HDL 38* 07/04/2015 1141   CHOLHDL 3.1 07/04/2015 1141   VLDL 12 07/04/2015 1141   LDLCALC 66 07/04/2015 1141      Many patients benefit from treatment even if their cholesterol is at goal.  Goal: Total Cholesterol (CHOL) less than 160  Goal:  Triglycerides (TRIG) less than 150  Goal:  HDL greater than 40  Goal:  LDL (LDLCALC) less than 100   BLOOD PRESSURE American Stroke Association blood pressure target is less that 120/80 mm/Hg  Your discharge blood pressure is:  BP: (!) 118/59 mmHg  Monitor your blood pressure  Limit your salt and alcohol intake  Many individuals will require more than one medication for high blood pressure  DIABETES (A1c is a blood sugar average for last 3 months) Goal HGBA1c is under 7% (HBGA1c  is blood sugar average for last 3 months)  Diabetes: Diagnosis of diabetes:  A1c was not drawn this admission    Lab Results  Component Value Date   HGBA1C 6.7* 07/04/2015     Your HGBA1c can be lowered with medications, healthy diet, and exercise.  Check your blood sugar as directed by your physician  Call your physician if you experience unexplained or low blood sugars.  PHYSICAL ACTIVITY/REHABILITATION Goal is 30 minutes at least 4 days per week  Activity: Increase activity slowly, and No driving, Therapies: Physical Therapy: Home Health  and Occupational Therapy: Home Health   Activity decreases your risk of heart attack and stroke and makes your heart stronger.  It helps control your weight and blood pressure; helps you relax and can improve your mood.  Participate in a regular exercise program.  Talk with your doctor about the best form of exercise for you (dancing, walking, swimming, cycling).  DIET/WEIGHT Goal is to maintain a healthy weight  Your discharge diet is: Diet regular Room service appropriate?: Yes; Fluid consistency:: Thin liquids   Following the type of diet specifically designed for you will help prevent another stroke.  Your goal Body Mass Index (BMI) is 19-24.  Healthy food habits can help reduce 3 risk factors for stroke:  High cholesterol, hypertension, and excess weight.  RESOURCES Stroke/Support Group:  Call [phone]   STROKE EDUCATION PROVIDED/REVIEWED AND GIVEN TO PATIENT Stroke warning signs and symptoms How to activate emergency medical system (call 911). Medications prescribed at discharge. Need for follow-up after discharge. Personal risk factors for stroke. Pneumonia vaccine given: No Flu vaccine given: No My questions have been answered, the writing is legible, and I understand these instructions.  I will adhere to these goals & educational materials that have been provided to me after my discharge from the hospital.

## 2015-07-05 NOTE — Progress Notes (Signed)
Per physical therapy, patient's HR elevated to 141 with ambulation. Patient's resting heart rate 110. Patient HR rechecked, 98 on dinamap and manually. MD notified, medications administered. Will recheck patient's heart rate, BP in one hour. Will notify MD.

## 2015-07-05 NOTE — Progress Notes (Signed)
Internal Medicine Attending  Date: 07/05/2015  Patient name: Briana Austin Medical record number: [id number] Date of birth: [birth year] Age: 81 y.o. Gender: female  I saw and evaluated the patient. I reviewed the resident's note by Dr. Marlowe Sax and I agree with the resident's findings and plans as documented in her progress note.  [name] was seen on rounds this morning as she was going to her EEG. She was without complaints at the time. She's had no further seizures on the Keppra. Her post ictal paresis has resolved. She is ready for discharge home with follow-up in her primary care provider's office as well as with neurology. She likely has sick euthyroid syndrome and it is recommended the TSH and free T4 be checked as an outpatient in approximately one month. She will also require a chest x-ray to be checked in approximately 4 weeks to assure clearing of her previous pneumonia. In the meantime, we will continue with the [place] [facility] for her anti-epileptic regimen.

## 2015-07-05 NOTE — Progress Notes (Addendum)
Subjective: Patient seen and examined at bedside today. She is awake, alert, and eating breakfast. Denies any fevers, chills, CP, SOB, or abdominal pain. Denies any focal neurological symptoms such weakness or numbness. No other complaints.   Objective: Vital signs in last 24 hours: Filed Vitals:   07/04/15 2124 07/05/15 0153 07/05/15 0524 07/05/15 1022  BP: 107/55 117/71 114/50 130/65  Pulse: 92 95 77 98  Temp: 99 F (37.2 C) 98.4 F (36.9 C) 97.5 F (36.4 C) 98.3 F (36.8 C)  TempSrc: Oral Oral Oral Oral  Resp: 16 16 15 17   SpO2: 96% 100% 97% 97%   Weight change:  No intake or output data in the 24 hours ending 07/05/15 1154  Physical Exam  Constitutional: She is well-developed, well-nourished, and in no distress.  HENT:  Head: Normocephalic and atraumatic.  Eyes: EOM are normal. Pupils are equal, round, and reactive to light.  Neck: Normal range of motion. No tracheal deviation present.  Cardiovascular: Normal rate, regular rhythm, normal heart sounds and intact distal pulses.  Pulmonary/Chest: Effort normal. Lungs CTAB, No wheezes, rales, or rhonchi.   Abdominal: Soft, non-tender, non-distended. +BS. There is no rebound and no guarding.  Musculoskeletal: Normal range of motion. She exhibits no edema.  Neurological: AAOx1, CN II-XII grossly intact. Strength and sensation grossly intact in b/l upper and lower extremities.  Skin: Skin is warm and dry.    Lab Results: Basic Metabolic Panel:  Recent Labs Lab 07/04/15 0630 07/05/15 0432  NA 132* 132*  K 3.6 3.4*  CL 100* 100*  CO2 24 24  GLUCOSE 91 114*  BUN 11 6  CREATININE 1.45* 1.23*  CALCIUM 9.8 9.6  PHOS  --  2.9   Liver Function Tests:  Recent Labs Lab 07/03/15 1616 07/04/15 0630 07/05/15 0432  AST 29 20  --   ALT 12* 10*  --   ALKPHOS 47 48  --   BILITOT 0.4 0.7  --   PROT 7.2 6.7  --   ALBUMIN 3.3* 3.2* 3.0*   CBC:  Recent Labs Lab 07/03/15 1616  07/04/15 0630 07/05/15 0432  WBC 7.5   --  7.5 5.0  NEUTROABS 4.3  --   --   --   HGB 10.1*  < > 9.5* 8.9*  HCT 31.0*  < > 29.7* 27.7*  MCV 93.9  --  93.4 93.6  PLT 152  --  164 158  < > = values in this interval not displayed. Cardiac Enzymes:  Recent Labs Lab 07/03/15 2256  CKTOTAL 60   CBG:  Recent Labs Lab 07/04/15 1208 07/04/15 1607 07/04/15 2024 07/05/15 0003 07/05/15 0356 07/05/15 0747  GLUCAP 107* 128* 130* 112* 113* 107*   Fasting Lipid Panel:  Recent Labs Lab 07/04/15 1141  CHOL 116  HDL 38*  LDLCALC 66  TRIG 59  CHOLHDL 3.1   Thyroid Function Tests:  Recent Labs Lab 07/04/15 1141  TSH 0.210*  FREET4 1.15*   Coagulation:  Recent Labs Lab 07/03/15 1616  LABPROT 14.0  INR 1.06   Anemia Panel:  Recent Labs Lab 07/04/15 1141 07/05/15 0432  VITAMINB12 240  --   FOLATE 14.7  --   FERRITIN  --  69  TIBC  --  309  IRON  --  60  RETICCTPCT  --  1.4   Urine Drug Screen:  Alcohol Level:  Recent Labs Lab 07/03/15 [place] <5    Micro Results: No results found for this or any previous visit (  from the past 240 hour(s)). Studies/Results: Ct Head Wo Contrast  07/03/2015   CLINICAL DATA:  Weakness seizure, with postictal left-sided deficit.  EXAM: CT HEAD WITHOUT CONTRAST  TECHNIQUE: Contiguous axial images were obtained from the base of the skull through the vertex without intravenous contrast.  COMPARISON:  None.  FINDINGS: No mass effect or midline shift. No evidence of acute intracranial hemorrhage, or infarction. 7 mm hypoattenuated structure within the right cerebellar may represent an old lacunar infarct. There is atrophy and chronic small vessel disease changes. No abnormal extra-axial fluid collections. Gray-white matter differentiation is normal. Basal cisterns are preserved.  No depressed skull fractures. Visualized paranasal sinuses and mastoid air cells are not opacified.  IMPRESSION: No acute intracranial abnormality.  Atrophy, chronic microvascular disease.   Probable remote right cerebellar lacunar infarct.  These results were called by telephone at the time of interpretation on 07/03/2015 at 4:09 pm to Dr. Armida Sans , who verbally acknowledged these results.   Electronically Signed   By: Fidela Salisbury M.D.   On: 07/03/2015 16:12   Mr Jodene Nam Head Wo Contrast  07/03/2015   CLINICAL DATA:  Altered mental status. Left facial droop. Low weakness in the left upper extremity. Symptoms have since resolved. Persistent confusion with dementia at baseline.  EXAM: MRI HEAD WITHOUT CONTRAST  MRA HEAD WITHOUT CONTRAST  TECHNIQUE: Multiplanar, multiecho pulse sequences of the brain and surrounding structures were obtained without intravenous contrast. Angiographic images of the head were obtained using MRA technique without contrast.  COMPARISON:  CT head without contrast 07/03/2015.  FINDINGS: MRI HEAD FINDINGS  The diffusion-weighted images demonstrate no acute or subacute infarction. The lacunar infarct in the right cerebellum is confirmed to be remote. No acute hemorrhage or mass lesion is present. Moderate generalized atrophy and mild periventricular white matter changes are noted bilaterally. Study is moderately degraded by patient motion. Flow is present in the major intracranial arteries. The globes and orbits are intact. The paranasal sinuses and mastoid air cells are clear. Midline structures are grossly within normal limits.  MRA HEAD FINDINGS  The study is mildly degraded by patient motion. The internal carotid arteries are within normal limits from the high cervical segments through the ICA termini. The A1 and M1 segments are normal. Anterior communicating artery is patent. The MCA bifurcations are intact. ACA and MCA branch vessels are within normal limits for age.  The left vertebral artery is slightly dominant to the right. The left PICA origin is visualized and normal. The right AICA is dominant. The basilar artery is normal. Both posterior cerebral arteries  originate from the basilar tip. The PCA branch vessels are within normal limits.  IMPRESSION: 1. No acute intracranial abnormality. 2. Remote lacunar infarct of the right cerebellum. 3. Normal variant MRA circle of Willis without significant proximal stenosis, aneurysm, or branch vessel occlusion.   Electronically Signed   By: San Morelle M.D.   On: 07/03/2015 19:00   Mr Brain Wo Contrast  07/03/2015   CLINICAL DATA:  Altered mental status. Left facial droop. Low weakness in the left upper extremity. Symptoms have since resolved. Persistent confusion with dementia at baseline.  EXAM: MRI HEAD WITHOUT CONTRAST  MRA HEAD WITHOUT CONTRAST  TECHNIQUE: Multiplanar, multiecho pulse sequences of the brain and surrounding structures were obtained without intravenous contrast. Angiographic images of the head were obtained using MRA technique without contrast.  COMPARISON:  CT head without contrast 07/03/2015.  FINDINGS: MRI HEAD FINDINGS  The diffusion-weighted images demonstrate no acute or subacute  infarction. The lacunar infarct in the right cerebellum is confirmed to be remote. No acute hemorrhage or mass lesion is present. Moderate generalized atrophy and mild periventricular white matter changes are noted bilaterally. Study is moderately degraded by patient motion. Flow is present in the major intracranial arteries. The globes and orbits are intact. The paranasal sinuses and mastoid air cells are clear. Midline structures are grossly within normal limits.  MRA HEAD FINDINGS  The study is mildly degraded by patient motion. The internal carotid arteries are within normal limits from the high cervical segments through the ICA termini. The A1 and M1 segments are normal. Anterior communicating artery is patent. The MCA bifurcations are intact. ACA and MCA branch vessels are within normal limits for age.  The left vertebral artery is slightly dominant to the right. The left PICA origin is visualized and normal.  The right AICA is dominant. The basilar artery is normal. Both posterior cerebral arteries originate from the basilar tip. The PCA branch vessels are within normal limits.  IMPRESSION: 1. No acute intracranial abnormality. 2. Remote lacunar infarct of the right cerebellum. 3. Normal variant MRA circle of Willis without significant proximal stenosis, aneurysm, or branch vessel occlusion.   Electronically Signed   By: San Morelle M.D.   On: 07/03/2015 19:00   Medications: I have reviewed the patient's current medications. Scheduled Meds: . aspirin EC  81 mg Oral Daily  . donepezil  5 mg Oral QHS  . enoxaparin (LOVENOX) injection  40 mg Subcutaneous Q24H  . feeding supplement (ENSURE ENLIVE)  237 mL Oral BID BM  . insulin aspart  0-9 Units Subcutaneous 6 times per day  . levETIRAcetam  500 mg Intravenous Q12H  . memantine  28 mg Oral QHS  . pravastatin  40 mg Oral QHS  . sodium chloride  3 mL Intravenous Q12H  . vitamin B-12  1,000 mcg Oral Daily   Continuous Infusions:  PRN Meds:.acetaminophen **OR** acetaminophen, LORazepam, ondansetron **OR** ondansetron (ZOFRAN) IV Assessment/Plan: Active Problems:   Facial droop   Seizure   Tonic-clonic seizures   Postictal paralysis   Dementia in Alzheimer's disease   Essential hypertension  Ms. Shampine is a 81 yo female with HTN, DMII, HLD, Alzheimers Dementia, and h/o breast cancer presenting with seizure.  Seizure: Patient has had 2 witnessed seizures with abnormal EEG. She has a long h/o Alzheimer's dementia. No h/o trauma or evidence of stroke. No evidence of mass lesion on MRI, though patient has remote history of breast cancer.  MRI of the brain without contrast demonstrated a remote lacunar infarct in the right cerebellum with moderate generalized atrophy and mild periventricular white matter changes bilaterally. There was no evidence of acute or subacute infarction or acute hemorrhage or mass lesion.  Seizure most likely secondary to  patient's underlying dementia. The L sided paresis she experienced yesterday was most likely post-ictal in nature and consistent with abnormal EEG finding of intermittent focal slowing in the right hemisphere. Echo from today showing EF 56-31%, grade 1 diastolic dysfunction, mildly calcified mitral valve leaflets, and no regional wall motion abnormalities. No cardiac source of emboli identified. Carotid doppler showing mild to moderate mixed plaque origin of proximal ICA and ECA with 39% ICA stenosis. Patient is already taking ASA and statin at home. No surgical intervention indicated at this time. B12 within normal range. TSH low (0.210) and free T4 high (1.15). On 06/08/15 TSH was normal. Likely sick euthyroid syndrome from recent Pneumonia. Labs also revealed a high corrected  calcium level of 10.4, however, patient does not have any symptoms of hypercalcemia.  - Neuro consult, apprecate rec's - If patient develops focal neurological symptoms, consider MRI of brain with contrast to look for a focal mass/ lesion - Seizure precautions - Continue Keppra 500 mg BID - Lorazepam 1 mg PRN seizure - PT/OT - Speech/ swallow study done and regular diet recommended  - Cont ASA 81 mg qd and Pravastatin 40 mg qd - Patient is currently not symptomatic. Will make a suggestion for PCP to repeat TSH and fT4 as outpatient.  - Will make suggestion for PCP to follow up PTH as outpatient. - Likely discharge today. Patient is to follow up with Neurology as outpatient    Dementia: Patient has reported h/o Alzheimers dementia, with unclear baseline. Will hold meds in setting of new seizures. - Hold Aricept and Namenda. Restart on discharge.   AKI on CKD3: Cr elevated to 1.6 on admission, with recent baseline 1-1.35. Patient has had decreased PO fluid intake, as evidenced by hyponatremia/hypochloremia. Cr trended down to 1.23 today and is expected to improve as patient has better po intake.   RLQ tenderness: patient  complained of mild RLQ tenderness on abdominal exam yesterday. The abdomen was soft and not distended. There was no rebound tenderness, rigidity, or guarding. Not likely appendicitis because patient was afebrile, not nauseous, and had a normal WBC count (7.5). Today patient states she is not having any abdominal pain. States yesterday the pain was just mild tenderness at the site of insulin injection on her abdomen. Abdominal exam was unremarkable today. Patient is tolerating her diet well and not complaining of any nausea, vomiting, diarrhea, or constipation.   DMII: Last A1c 6/15, with A1c 6.4. Patient on Glipizide and Metformin at home. Will hold home meds in setting of new seizures.  - SSI - Glipizide and Metformin can be restarted on discharge  HTN: BP stable today.  - Home meds Lisinopril and Metop-HCTZ restarted. Patient to continue taking these medications at home.   HLD -Cont Pravastatin   PNA: Patient is afebrile and saturating 98% on RA. She has finished her course of Levofloxacin. Will make suggestion for PCP to repeat CXR in 4-6 weeks.   H/o DVT: LE dopplers at previous admission demonstrated right peroneal DVT, untreated during that admission. Patient has no pulmonary complaints or calf pain. Doppler did not show any evidence of DVT or superficial thrombosis of the left and right lower extremities.   H/o Breast cancer: Unknown subtype. Patient s/p lumpectomy and radiation. Her oncologist has signed off.   FEN/GI: -regular diet   DVT ppx: Lovenox   Dispo: Disposition is deferred at this time, awaiting improvement of current medical problems.  Anticipated discharge in approximately 0 day(s).   The patient does have a current PCP Birdie Sons, MD) and does need an [facility] hospital follow-up appointment after discharge.  The patient does not have transportation limitations that hinder transportation to clinic appointments.  .Services Needed at time of discharge: Y = Yes,  Blank = No PT:   OT:   RN:   Equipment:   Other:     LOS: 2 days   Shela Leff, MD 07/05/2015, 11:54 AM

## 2015-07-05 NOTE — Progress Notes (Signed)
Initial Nutrition Assessment  INTERVENTION:  Continue Ensure Enlive po BID, each supplement provides 350 kcal and 20 grams of protein  NUTRITION DIAGNOSIS:   Predicted suboptimal nutrient intake related to acute illness as evidenced by per patient/family report.   GOAL:   Patient will meet greater than or equal to 90% of their needs   MONITOR:   PO intake, Supplement acceptance, Labs, Weight trends  REASON FOR ASSESSMENT:   Malnutrition Screening Tool    ASSESSMENT:   81 year old woman with a history of Alzheimer's dementia, breast cancer status post lumpectomy followed by radiation therapy, hypertension, hyperlipidemia, and type 2 diabetes who presents after an episode of unresponsiveness characterized as the eyes rolling back, becoming stiff and shaking all over. Family also notes that the patient has had decreased appetite and fluid intake for a long time. She also hiccups with eating/drinking.  Visited pt x 3 attempts yesterday and again this morning without success. Per SLP note, pt's family had reported poor appetite and pt reported that food taste bad. Pt's weight appears to be stable over the past few months though no new weights in the past few weeks. During previous admission to [facility] for PNA pt was only eating bites of food and was drinking Ensure supplements.   Labs reviewed.    Diet Order:  Diet regular Room service appropriate?: Yes; Fluid consistency:: Thin  Skin:  Reviewed, no issues  Last BM:  9/1  Height:   Ht Readings from Last 1 Encounters:  06/08/15 5\' 8"  (1.727 m)    Weight:   Wt Readings from Last 1 Encounters:  06/08/15 162 lb (73.483 kg)    Ideal Body Weight:  63.6 kg  BMI:  There is no weight on file to calculate BMI.  Estimated Nutritional Needs:   Kcal:  1700-1900  Protein:  85-100 grams  Fluid:  1.7-1.9 L/day  EDUCATION NEEDS:   No education needs identified at this time  [place], LDN Inpatient Clinical  Dietitian Pager: [phone] After Hours Pager: [phone]

## 2015-07-05 NOTE — Progress Notes (Signed)
EEG Completed; Results Pending  

## 2015-07-05 NOTE — Telephone Encounter (Signed)
Pt is being discharged from [facility] today for seizures.  I have made a hospital follow up appt for 07/10/15/MW

## 2015-07-05 NOTE — Discharge Summary (Signed)
Name: Briana Austin MRN: [id number] DOB: August 07, 1934 81 y.o. PCP: Birdie Sons, MD  Date of Admission: 07/03/2015  3:50 PM Date of Discharge: 07/05/2015 Attending Physician: Oval Linsey, MD  Discharge Diagnosis: 1.  Active Problems:   Facial droop   Seizure   Tonic-clonic seizures   Postictal paralysis   Dementia in Alzheimer's disease   Essential hypertension  Discharge Medications:   Medication List    ASK your doctor about these medications        acidophilus Caps capsule  Take 1 capsule by mouth 2 (two) times daily.     aspirin EC 81 MG tablet  Take 81 mg by mouth daily.     calcium carbonate 600 MG Tabs tablet  Commonly known as:  OS-CAL  Take 1 tablet by mouth daily.     donepezil 5 MG tablet  Commonly known as:  ARICEPT  Take 1 tablet by mouth at bedtime.     feeding supplement (ENSURE ENLIVE) Liqd  Take 237 mLs by mouth 2 (two) times daily between meals.     glipiZIDE 5 MG 24 hr tablet  Commonly known as:  GLUCOTROL XL  Take 5 mg by mouth daily with breakfast.     levofloxacin 500 MG tablet  Commonly known as:  LEVAQUIN  Take 1 tablet (500 mg total) by mouth daily.     lisinopril 40 MG tablet  Commonly known as:  PRINIVIL,ZESTRIL  Take 1 tablet (40 mg total) by mouth daily.     memantine 28 MG Cp24 24 hr capsule  Commonly known as:  NAMENDA XR  Take 1 capsule by mouth at bedtime.     metFORMIN 500 MG (MOD) 24 hr tablet  Commonly known as:  GLUMETZA  Take 500 mg by mouth daily with breakfast.     metoprolol-hydrochlorothiazide 100-50 MG per tablet  Commonly known as:  LOPRESSOR HCT  TAKE ONE TABLET BY MOUTH ONCE DAILY     potassium chloride 10 MEQ tablet  Commonly known as:  K-DUR,KLOR-CON  Take 1 tablet by mouth at bedtime.     pravastatin 40 MG tablet  Commonly known as:  PRAVACHOL  Take 1 tablet (40 mg total) by mouth daily.     Vitamin D (Ergocalciferol) 50000 UNITS Caps capsule  Commonly known as:  DRISDOL  Take 1 capsule  by mouth once a week. Take on Tuesday.        Disposition and follow-up:   Briana Austin was discharged from [facility] in Good condition.  At the hospital follow up visit please address:  1.  Seizures Recent pneumonia Hyperthyroidism Hypercalcemia  2.  Labs / imaging needed at time of follow-up: TSH, free T4, PTH, repeat CXR in 4-6 weeks, BMP  3.  Pending labs/ test needing follow-up: None  Follow-up Appointments:  PCP Dr. Lelon Huh on 07/10/15  [facility] - patient to make appointment within 1 week.   Consultations:  Neurology   Procedures Performed:  Dg Chest 2 View  06/27/2015   CLINICAL DATA:  Pneumonia 2 weeks ago, presently with cough and congestion.  EXAM: CHEST  2 VIEW  COMPARISON:  Chest x-ray dated 06/07/2015 and chest CT dated 06/08/2015.  FINDINGS: There has been interval clearing of the opacity within the right lower lobe, with only small scattered airspace opacities remaining. No new lung findings. No pleural effusions seen. No pneumothorax. No osseous abnormality. Heart size is upper normal, unchanged. Atherosclerotic calcifications again noted at the aortic knob.  IMPRESSION: Improving right lower lobe pneumonia, nearly resolved.   Electronically Signed   By: Franki Cabot M.D.   On: 06/27/2015 08:03   Dg Chest 2 View  06/07/2015   CLINICAL DATA:  Onset of cough and wheezing 5 days ago, former smoker, history of diabetes  EXAM: CHEST  2 VIEW  COMPARISON:  Chest x-ray of June 22, 2013  FINDINGS: The lungs are well-expanded. The interstitial markings are coarse bilaterally. The heart is normal in size. The pulmonary vascularity is not engorged. There is no pleural effusion or pneumothorax. There is a small hiatal hernia. There is tortuosity of the descending thoracic aorta. The bony thorax is unremarkable.  IMPRESSION: Chronic bronchitic changes. There is no pneumonia nor other acute cardiopulmonary abnormality.   Electronically Signed    By: David  Martinique M.D.   On: 06/07/2015 12:42   Ct Head Wo Contrast  07/03/2015   CLINICAL DATA:  Weakness seizure, with postictal left-sided deficit.  EXAM: CT HEAD WITHOUT CONTRAST  TECHNIQUE: Contiguous axial images were obtained from the base of the skull through the vertex without intravenous contrast.  COMPARISON:  None.  FINDINGS: No mass effect or midline shift. No evidence of acute intracranial hemorrhage, or infarction. 7 mm hypoattenuated structure within the right cerebellar may represent an old lacunar infarct. There is atrophy and chronic small vessel disease changes. No abnormal extra-axial fluid collections. Gray-white matter differentiation is normal. Basal cisterns are preserved.  No depressed skull fractures. Visualized paranasal sinuses and mastoid air cells are not opacified.  IMPRESSION: No acute intracranial abnormality.  Atrophy, chronic microvascular disease.  Probable remote right cerebellar lacunar infarct.  These results were called by telephone at the time of interpretation on 07/03/2015 at 4:09 pm to Dr. Armida Sans , who verbally acknowledged these results.   Electronically Signed   By: Fidela Salisbury M.D.   On: 07/03/2015 16:12   Ct Chest W Contrast  06/09/2015   CLINICAL DATA:  Pt with dementia and unable to give any hx. Per MD note in chart pt "with a known history of breast cancer, hypertension, diabetes, hyperlipidemia and dementia presents to the emergency room with complaints of feeling very weak and tired for the past few days. She was seen by her primary care provider and was started on Augmentin at that time also was noted to have fever of 102. Patient presents to the ED today complaining of similar type of symptoms. She is noted to have a temperature 102.7 here. She underwent a chest x-ray which did not show any abnormality her urinalysis was negative. Her WBC count is normal as well. Patient also endorses weight loss. Over the past few months. She has some dry cough but  according the family slight clearing her throat from time to time. There is no production to the cough. No chest pains palpitations no shortness of breath. No nausea vomiting or diarrhea. Denies any urinary frequency urgency or hesitancy."  EXAM: CT CHEST, ABDOMEN, AND PELVIS WITH CONTRAST  TECHNIQUE: Multidetector CT imaging of the chest, abdomen and pelvis was performed following the standard protocol during bolus administration of intravenous contrast.  CONTRAST:  75mL OMNIPAQUE IOHEXOL 300 MG/ML  SOLN  COMPARISON:  Chest radiograph 06/07/2015.  FINDINGS: CT CHEST FINDINGS  Thoracic inlet: Right thyroid nodules largest measuring 12 mm. No neck base or axillary masses or adenopathy.  Mediastinum and hila: Heart mildly enlarged. There are dense coronary artery calcifications. Great vessels normal in caliber for age. Atherosclerotic calcifications along the aortic arch, descending  thoracic aorta and aortic arch branch vessels. No mediastinal or hilar masses or pathologically enlarged lymph nodes. Nonspecific mild dilation of distal esophagus. No evidence of an esophageal mass.  Lungs and pleura: Patchy consolidation is noted in the posterior right lower lobe consistent with pneumonia. There is mild subsegmental atelectasis in both posterior lung bases. No lung mass or suspicious nodule. No pulmonary edema. No pleural effusion or pneumothorax.  CT ABDOMEN AND PELVIS FINDINGS  Liver, spleen, gallbladder, pancreas, adrenal glands:  Unremarkable.  Kidneys, ureters, bladder: Several small low-density renal lesions consistent with cysts. No stones. No hydronephrosis. Ureters normal in course and in caliber. Bladder is unremarkable.  Uterus and adnexa:  Uterus surgically absent.  No adnexal masses.  Lymph nodes:  No enlarged lymph nodes.  Ascites: None.  Vascular: Dense atherosclerotic calcifications along a mildly ectatic abdominal aorta and its branch vessels. No aneurysm.  Gastrointestinal: There is mild wall thickening  along the mid sigmoid colon over a short segment with mild adjacent fat stranding. Although this may be chronic, minimal uncomplicated diverticulitis should be suspected if there are consistent clinical symptoms. There are multiple other colonic diverticula with no other evidence of inflammation. There is no bowel dilation. Small bowel is unremarkable. No appendix visualized.  MUSCULOSKELETAL FINDINGS  No osteoblastic or osteolytic lesions. There are only mild degenerative changes throughout visualized spine.  IMPRESSION: 1. Right lower lobe pneumonia. 2. Small area of wall thickening and mild adjacent inflammatory stranding of the sigmoid colon. This may be chronic or reflect mild uncomplicated diverticulitis. 3. No other evidence of an acute abnormality within the chest, abdomen or pelvis.   Electronically Signed   By: Lajean Manes M.D.   On: 06/09/2015 09:24   Mr Jodene Nam Head Wo Contrast  07/03/2015   CLINICAL DATA:  Altered mental status. Left facial droop. Low weakness in the left upper extremity. Symptoms have since resolved. Persistent confusion with dementia at baseline.  EXAM: MRI HEAD WITHOUT CONTRAST  MRA HEAD WITHOUT CONTRAST  TECHNIQUE: Multiplanar, multiecho pulse sequences of the brain and surrounding structures were obtained without intravenous contrast. Angiographic images of the head were obtained using MRA technique without contrast.  COMPARISON:  CT head without contrast 07/03/2015.  FINDINGS: MRI HEAD FINDINGS  The diffusion-weighted images demonstrate no acute or subacute infarction. The lacunar infarct in the right cerebellum is confirmed to be remote. No acute hemorrhage or mass lesion is present. Moderate generalized atrophy and mild periventricular white matter changes are noted bilaterally. Study is moderately degraded by patient motion. Flow is present in the major intracranial arteries. The globes and orbits are intact. The paranasal sinuses and mastoid air cells are clear. Midline  structures are grossly within normal limits.  MRA HEAD FINDINGS  The study is mildly degraded by patient motion. The internal carotid arteries are within normal limits from the high cervical segments through the ICA termini. The A1 and M1 segments are normal. Anterior communicating artery is patent. The MCA bifurcations are intact. ACA and MCA branch vessels are within normal limits for age.  The left vertebral artery is slightly dominant to the right. The left PICA origin is visualized and normal. The right AICA is dominant. The basilar artery is normal. Both posterior cerebral arteries originate from the basilar tip. The PCA branch vessels are within normal limits.  IMPRESSION: 1. No acute intracranial abnormality. 2. Remote lacunar infarct of the right cerebellum. 3. Normal variant MRA circle of Willis without significant proximal stenosis, aneurysm, or branch vessel occlusion.  Electronically Signed   By: San Morelle M.D.   On: 07/03/2015 19:00   Mr Brain Wo Contrast  07/03/2015   CLINICAL DATA:  Altered mental status. Left facial droop. Low weakness in the left upper extremity. Symptoms have since resolved. Persistent confusion with dementia at baseline.  EXAM: MRI HEAD WITHOUT CONTRAST  MRA HEAD WITHOUT CONTRAST  TECHNIQUE: Multiplanar, multiecho pulse sequences of the brain and surrounding structures were obtained without intravenous contrast. Angiographic images of the head were obtained using MRA technique without contrast.  COMPARISON:  CT head without contrast 07/03/2015.  FINDINGS: MRI HEAD FINDINGS  The diffusion-weighted images demonstrate no acute or subacute infarction. The lacunar infarct in the right cerebellum is confirmed to be remote. No acute hemorrhage or mass lesion is present. Moderate generalized atrophy and mild periventricular white matter changes are noted bilaterally. Study is moderately degraded by patient motion. Flow is present in the major intracranial arteries. The  globes and orbits are intact. The paranasal sinuses and mastoid air cells are clear. Midline structures are grossly within normal limits.  MRA HEAD FINDINGS  The study is mildly degraded by patient motion. The internal carotid arteries are within normal limits from the high cervical segments through the ICA termini. The A1 and M1 segments are normal. Anterior communicating artery is patent. The MCA bifurcations are intact. ACA and MCA branch vessels are within normal limits for age.  The left vertebral artery is slightly dominant to the right. The left PICA origin is visualized and normal. The right AICA is dominant. The basilar artery is normal. Both posterior cerebral arteries originate from the basilar tip. The PCA branch vessels are within normal limits.  IMPRESSION: 1. No acute intracranial abnormality. 2. Remote lacunar infarct of the right cerebellum. 3. Normal variant MRA circle of Willis without significant proximal stenosis, aneurysm, or branch vessel occlusion.   Electronically Signed   By: San Morelle M.D.   On: 07/03/2015 19:00   Ct Abdomen Pelvis W Contrast  06/09/2015   CLINICAL DATA:  Pt with dementia and unable to give any hx. Per MD note in chart pt "with a known history of breast cancer, hypertension, diabetes, hyperlipidemia and dementia presents to the emergency room with complaints of feeling very weak and tired for the past few days. She was seen by her primary care provider and was started on Augmentin at that time also was noted to have fever of 102. Patient presents to the ED today complaining of similar type of symptoms. She is noted to have a temperature 102.7 here. She underwent a chest x-ray which did not show any abnormality her urinalysis was negative. Her WBC count is normal as well. Patient also endorses weight loss. Over the past few months. She has some dry cough but according the family slight clearing her throat from time to time. There is no production to the cough.  No chest pains palpitations no shortness of breath. No nausea vomiting or diarrhea. Denies any urinary frequency urgency or hesitancy."  EXAM: CT CHEST, ABDOMEN, AND PELVIS WITH CONTRAST  TECHNIQUE: Multidetector CT imaging of the chest, abdomen and pelvis was performed following the standard protocol during bolus administration of intravenous contrast.  CONTRAST:  3mL OMNIPAQUE IOHEXOL 300 MG/ML  SOLN  COMPARISON:  Chest radiograph 06/07/2015.  FINDINGS: CT CHEST FINDINGS  Thoracic inlet: Right thyroid nodules largest measuring 12 mm. No neck base or axillary masses or adenopathy.  Mediastinum and hila: Heart mildly enlarged. There are dense coronary artery calcifications. [employer]  vessels normal in caliber for age. Atherosclerotic calcifications along the aortic arch, descending thoracic aorta and aortic arch branch vessels. No mediastinal or hilar masses or pathologically enlarged lymph nodes. Nonspecific mild dilation of distal esophagus. No evidence of an esophageal mass.  Lungs and pleura: Patchy consolidation is noted in the posterior right lower lobe consistent with pneumonia. There is mild subsegmental atelectasis in both posterior lung bases. No lung mass or suspicious nodule. No pulmonary edema. No pleural effusion or pneumothorax.  CT ABDOMEN AND PELVIS FINDINGS  Liver, spleen, gallbladder, pancreas, adrenal glands:  Unremarkable.  Kidneys, ureters, bladder: Several small low-density renal lesions consistent with cysts. No stones. No hydronephrosis. Ureters normal in course and in caliber. Bladder is unremarkable.  Uterus and adnexa:  Uterus surgically absent.  No adnexal masses.  Lymph nodes:  No enlarged lymph nodes.  Ascites: None.  Vascular: Dense atherosclerotic calcifications along a mildly ectatic abdominal aorta and its branch vessels. No aneurysm.  Gastrointestinal: There is mild wall thickening along the mid sigmoid colon over a short segment with mild adjacent fat stranding. Although this may be  chronic, minimal uncomplicated diverticulitis should be suspected if there are consistent clinical symptoms. There are multiple other colonic diverticula with no other evidence of inflammation. There is no bowel dilation. Small bowel is unremarkable. No appendix visualized.  MUSCULOSKELETAL FINDINGS  No osteoblastic or osteolytic lesions. There are only mild degenerative changes throughout visualized spine.  IMPRESSION: 1. Right lower lobe pneumonia. 2. Small area of wall thickening and mild adjacent inflammatory stranding of the sigmoid colon. This may be chronic or reflect mild uncomplicated diverticulitis. 3. No other evidence of an acute abnormality within the chest, abdomen or pelvis.   Electronically Signed   By: Lajean Manes M.D.   On: 06/09/2015 09:24   US Venous Img Lower Bilateral  06/12/2015   CLINICAL DATA:  Fever  EXAM: BILATERAL LOWER EXTREMITY VENOUS DOPPLER ULTRASOUND  TECHNIQUE: Gray-scale sonography with graded compression, as well as color Doppler and duplex ultrasound were performed to evaluate the lower extremity deep venous systems from the level of the common femoral vein and including the common femoral, femoral, profunda femoral, popliteal and calf veins including the posterior tibial, peroneal and gastrocnemius veins when visible. The superficial great saphenous vein was also interrogated. Spectral Doppler was utilized to evaluate flow at rest and with distal augmentation maneuvers in the common femoral, femoral and popliteal veins.  COMPARISON:  None.  FINDINGS: RIGHT LOWER EXTREMITY  Common Femoral Vein: No evidence of thrombus. Normal compressibility, respiratory phasicity and response to augmentation.  Saphenofemoral Junction: No evidence of thrombus. Normal compressibility and flow on color Doppler imaging.  Profunda Femoral Vein: No evidence of thrombus. Normal compressibility and flow on color Doppler imaging.  Femoral Vein: No evidence of thrombus. Normal compressibility,  respiratory phasicity and response to augmentation.  Popliteal Vein: No evidence of thrombus. Normal compressibility, respiratory phasicity and response to augmentation.  Calf Veins: Abnormal right peroneal vein which is noncompressible with echogenic clot.  Superficial Great Saphenous Vein: No evidence of thrombus. Normal compressibility and flow on color Doppler imaging.  Venous Reflux:  None.  Other Findings:  None.  LEFT LOWER EXTREMITY  Common Femoral Vein: No evidence of thrombus. Normal compressibility, respiratory phasicity and response to augmentation.  Saphenofemoral Junction: No evidence of thrombus. Normal compressibility and flow on color Doppler imaging.  Profunda Femoral Vein: No evidence of thrombus. Normal compressibility and flow on color Doppler imaging.  Femoral Vein: No evidence of thrombus. Normal  compressibility, respiratory phasicity and response to augmentation.  Popliteal Vein: No evidence of thrombus. Normal compressibility, respiratory phasicity and response to augmentation.  Calf Veins: No evidence of thrombus. Normal compressibility and flow on color Doppler imaging.  Superficial Great Saphenous Vein: No evidence of thrombus. Normal compressibility and flow on color Doppler imaging.  Venous Reflux:  None.  Other Findings: Left popliteal fossa cyst measuring 4.8 x 3.6 x 1.2 cm consistent with Baker cyst.  IMPRESSION: Isolated clot in the right peroneal vein. Otherwise no evidence of DVT  Left Baker cyst   Electronically Signed   By: Franchot Gallo M.D.   On: 06/12/2015 11:29   Admission HPI: Ms. Bastarache is a 81 yo female with HTN, DMII, HLD, Alzheimers Dementia and h/o breast cancer, presenting as code stroke. History was obtained from the patient's family. Family reports that at approximately 2 pm, the patient became unresponsive, her eyes rolled back, she became stiff, and started shaking. The episode lasted a couple minutes. There was no tongue biting or loss of bowel/bladder  control. She remained slightly unresponsive with left sided weakness when EMS arrived. She had returned to her normal mental state without residual symptoms by the time the patient arrived to the ED.   Family also notes that the patient has had decreased appetite and fluid intake for a long time. She also hiccups with eating/drinking. She is also recovering from RLL PNA, diagnosed August 5th. Another 7 days of Levofloxacin was prescribed, as the PNA was incompletely resolved on 8/24.  Neurology consult in the ED found NIHSS of 1. CT showed chronic atrophy, chronic microvascular disease, and remote right cerebellar lacunar infarct. MRI/MRA brain was negative for stroke or mass. EEG showed intermittent focal slowing in right hemisphere.  While interviewing patient and family, the patient had another episode of seizure like activity, with tonic-clonic movements of the extremities and post-ictal confusion.   Hospital Course by problem list: Active Problems:   Facial droop   Seizure   Tonic-clonic seizures   Postictal paralysis   Dementia in Alzheimer's disease   Essential hypertension   1. Seizure: Patient had 2 witnessed seizures with abnormal EEG. She was given Keppra 1000 mg load, then 500 mg BID. Etiology of seizure unclear, but patient has long h/o Alzheimer's dementia.No h/o trauma or evidence of stroke. No evidence of mass lesion on MRI, though patient has remote history of breast cancer. MRI of the brain without contrast demonstrated a remote lacunar infarct in the right cerebellum with moderate generalized atrophy and mild periventricular white matter changes bilaterally. There was no evidence of acute or subacute infarction or acute hemorrhage or mass lesion. Seizure most likely secondary to patient's underlying dementia. The L sided paresis she experienced on admission was most likely post-ictal in nature and consistent with abnormal EEG finding of intermittent focal slowing in  the right hemisphere. Echo showing EF 02-72%, grade 1 diastolic dysfunction, mildly calcified mitral valve leaflets, and no regional wall motion abnormalities. No cardiac source of emboli identified. Carotid doppler showing mild to moderate mixed plaque origin of proximal ICA and ECA with 39% ICA stenosis. Patient is already taking ASA and statin at home. No surgical intervention indicated at this time. B12 within normal range. TSH low (0.210) and free T4 high (1.15). On 06/08/15 TSH was normal. Likely sick euthyroid syndrome from recent Pneumonia. Labs also revealed a high corrected calcium level of 10.4, however, patient did not have any symptoms of hypercalcemia. After being started on Keppra, patient did  not have any more episodes of seizures. Her L sided paresis resolved. Patient did not have focal neurological symptoms such as weakness or numbness on day of discharge. She is to follow up with PCP and Neurology as outpatient.   Dementia: Patient has reported h/o Alzheimers dementia, with unclear baseline. Held home meds Aricept and Namenda in the setting of new seizures.  AKI on CKD3: Cr elevated to 1.6, with recent baseline 1-1.35. Patient had decreased PO fluid intake, as evidenced by hyponatremia/hypochloremia. She was given IVF until her po intake improved. Cr trended down to 1.23 on day of discharge and is expected to improve as patient has better po intake.   DMII: Last A1c 6/15, with A1c 6.4. Patient on Glipizide and Metformin at home. Held home meds in setting of new seizures.  HTN: BPs normal since arrival to ED. One elevated reading was during seizure.Held meds and monitored BP. Home meds Lisinopril and Metoprolol-HCTZ restarted on day of discharge as patient was tolerating po meds.   HLD: Pravastatin  PNA: Patient had pneumonia recently and has finished her course of Levofloxacin. She was afebrile and saturating 98& on RA. I suggest PCP repeat CXR in 4-6 weeks.   H/o DVT: LE dopplers  at previous admission demonstrated right peroneal DVT, untreated during that admission.Patient had no pulmonary complaints or calf pain during this hospitalization. Doppler did not show any evidence of DVT or superficial thrombosis of the left and right lower extremities.   H/o Breast cancer: Unknown subtype. Patient s/p lumpectomy and radiation. Her oncologist has signed off.    Discharge Vitals:   BP 130/65 mmHg  Pulse 98  Temp(Src) 98.3 F (36.8 C) (Oral)  Resp 17  SpO2 97%  Discharge Labs:  Results for orders placed or performed during the hospital encounter of 07/03/15 (from the past 24 hour(s))  Glucose, capillary     Status: Abnormal   Collection Time: 07/04/15 12:08 PM  Result Value Ref Range   Glucose-Capillary 107 (H) 65 - 99 mg/dL   Comment 1 Notify RN    Comment 2 Document in Chart   Glucose, capillary     Status: Abnormal   Collection Time: 07/04/15  4:07 PM  Result Value Ref Range   Glucose-Capillary 128 (H) 65 - 99 mg/dL   Comment 1 Notify RN    Comment 2 Document in Chart   Glucose, capillary     Status: Abnormal   Collection Time: 07/04/15  8:24 PM  Result Value Ref Range   Glucose-Capillary 130 (H) 65 - 99 mg/dL   Comment 1 Notify RN    Comment 2 Document in Chart   Glucose, capillary     Status: Abnormal   Collection Time: 07/05/15 12:03 AM  Result Value Ref Range   Glucose-Capillary 112 (H) 65 - 99 mg/dL   Comment 1 Notify RN    Comment 2 Document in Chart   Glucose, capillary     Status: Abnormal   Collection Time: 07/05/15  3:56 AM  Result Value Ref Range   Glucose-Capillary 113 (H) 65 - 99 mg/dL   Comment 1 Notify RN    Comment 2 Document in Chart   CBC     Status: Abnormal   Collection Time: 07/05/15  4:32 AM  Result Value Ref Range   WBC 5.0 4.0 - 10.5 K/uL   RBC 2.96 (L) 3.87 - 5.11 MIL/uL   Hemoglobin 8.9 (L) 12.0 - 15.0 g/dL   HCT 27.7 (L) 36.0 - 46.0 %  MCV 93.6 78.0 - 100.0 fL   MCH 30.1 26.0 - 34.0 pg   MCHC 32.1 30.0 - 36.0  g/dL   RDW 13.8 11.5 - 15.5 %   Platelets 158 150 - 400 K/uL  Iron and TIBC     Status: None   Collection Time: 07/05/15  4:32 AM  Result Value Ref Range   Iron 60 28 - 170 ug/dL   TIBC 309 250 - 450 ug/dL   Saturation Ratios 19 10.4 - 31.8 %   UIBC 249 ug/dL  Ferritin     Status: None   Collection Time: 07/05/15  4:32 AM  Result Value Ref Range   Ferritin 69 11 - 307 ng/mL  Reticulocytes     Status: Abnormal   Collection Time: 07/05/15  4:32 AM  Result Value Ref Range   Retic Ct Pct 1.4 0.4 - 3.1 %   RBC. 2.96 (L) 3.87 - 5.11 MIL/uL   Retic Count, Manual 41.4 19.0 - 186.0 K/uL  Technologist smear review     Status: None   Collection Time: 07/05/15  4:32 AM  Result Value Ref Range   Tech Review POLYCHROMASIA PRESENT   Renal function panel     Status: Abnormal   Collection Time: 07/05/15  4:32 AM  Result Value Ref Range   Sodium 132 (L) 135 - 145 mmol/L   Potassium 3.4 (L) 3.5 - 5.1 mmol/L   Chloride 100 (L) 101 - 111 mmol/L   CO2 24 22 - 32 mmol/L   Glucose, Bld 114 (H) 65 - 99 mg/dL   BUN 6 6 - 20 mg/dL   Creatinine, Ser 1.23 (H) 0.44 - 1.00 mg/dL   Calcium 9.6 8.9 - 10.3 mg/dL   Phosphorus 2.9 2.5 - 4.6 mg/dL   Albumin 3.0 (L) 3.5 - 5.0 g/dL   GFR calc non Af Amer 41 (L) >60 mL/min   GFR calc Af Amer 47 (L) >60 mL/min   Anion gap 8 5 - 15  Glucose, capillary     Status: Abnormal   Collection Time: 07/05/15  7:47 AM  Result Value Ref Range   Glucose-Capillary 107 (H) 65 - 99 mg/dL    Signed: Shela Leff, MD 07/05/2015, 11:55 AM    Services Ordered on Discharge: None Equipment Ordered on Discharge: None

## 2015-07-05 NOTE — Procedures (Signed)
ELECTROENCEPHALOGRAM REPORT  Date of Study: 07/05/2015  Patient's Name: Briana Austin MRN: [id number] Date of Birth: May 29, 1934  Referring Provider: Dr. Rosalin Hawking  Clinical History: This is a 81 year old woman with an episode of unresponsiveness followed by left facial droop and left grip weakness.  Medications: levETIRAcetam (KEPPRA) 500 mg in sodium chloride 0.9 % 100 mL IVPB acetaminophen (TYLENOL) suppository 650 mg  acetaminophen (TYLENOL) tablet 650 mg aspirin EC tablet 81 mg donepezil (ARICEPT) tablet 5 mg enoxaparin (LOVENOX) injection 40 mg insulin aspart (novoLOG) injection 0-9 Units LORazepam (ATIVAN) injection 1 mg memantine (NAMENDA XR) 24 hr capsule 28 mg ondansetron (ZOFRAN) tablet 4 mg pravastatin (PRAVACHOL) tablet 40 mg vitamin B-12 (CYANOCOBALAMIN) tablet 1,000 mcg  Technical Summary: A multichannel digital EEG recording measured by the international 10-20 system with electrodes applied with paste and impedances below 5000 ohms performed as portable with EKG monitoring in an awake and asleep patient.  Hyperventilation and photic stimulation were not performed.  The digital EEG was referentially recorded, reformatted, and digitally filtered in a variety of bipolar and referential montages for optimal display.   Description: The patient is awake and asleep during the recording.  During maximal wakefulness, there is a poorly sustained low voltage 8-9 Hz posterior dominant rhythm that poorly attenuates to eye opening and eye closure. This is admixed with a small amount of diffuse 4-5 Hz theta slowing of the waking background. There is additional occasional focal 2-3 Hz delta slowing over the left temporal region. During drowsiness and sleep, there is an increase in theta and delta slowing of the background, with shifting asymmetry over the bilateral temporal regions.  Vertex waves and symmetric sleep spindles were seen.  Hyperventilation and photic stimulation were not  performed.  There were no epileptiform discharges or electrographic seizures seen.    EKG lead was unremarkable.  Impression: This awake and asleep EEG is abnormal due to the presence of: 1. Mild diffuse slowing of the background 2. Focal slowing over the left temporal region  Clinical Correlation of the above findings indicates bilateral cerebral dysfunction that is non-specific in etiology and can be seen with hypoxic/ischemic injury, toxic/metabolic encephalopathies, or medication effect.  Focal slowing over the left temporal region indicates focal cerebral dysfunction in this region, suggestive of underlying structural or physiologic abnormality. The absence of epileptiform discharges does not rule out a clinical diagnosis of epilepsy.  Clinical correlation is advised.   Ellouise Newer, M.D.

## 2015-07-05 NOTE — Care Management Note (Signed)
Case Management Note  Patient Details  Name: Briana Austin MRN: [id number] Date of Birth: 1934-07-20  Subjective/Objective:                    Action/Plan: Met with patient to discuss discharge needs. Patient plans to discharge back home with [facility], whom she was already active with.  Miranda with [facility] was notified and has accepted the referral for discharge home today.  Bedside RN updated.  Expected Discharge Date:                  Expected Discharge Plan:  [place]  In-House Referral:     Discharge planning Services  CM Consult  Post Acute Care Choice:    Choice offered to:     DME Arranged:    DME Agency:     HH Arranged:  RN, PT, OT, Nurse's Aide [place] Agency:  [facility]  Status of Service:  Completed, signed off  Medicare Important Message Given:    Date Medicare IM Given:    Medicare IM give by:    Date Additional Medicare IM Given:    Additional Medicare Important Message give by:     If discussed at [place] of Stay Meetings, dates discussed:    Additional Comments:  Rolm Baptise, RN 07/05/2015, 2:49 PM

## 2015-07-06 DIAGNOSIS — E785 Hyperlipidemia, unspecified: Secondary | ICD-10-CM | POA: Diagnosis not present

## 2015-07-06 DIAGNOSIS — Z87891 Personal history of nicotine dependence: Secondary | ICD-10-CM | POA: Diagnosis not present

## 2015-07-06 DIAGNOSIS — F0390 Unspecified dementia without behavioral disturbance: Secondary | ICD-10-CM | POA: Diagnosis not present

## 2015-07-06 DIAGNOSIS — J189 Pneumonia, unspecified organism: Secondary | ICD-10-CM | POA: Diagnosis not present

## 2015-07-06 DIAGNOSIS — E119 Type 2 diabetes mellitus without complications: Secondary | ICD-10-CM | POA: Diagnosis not present

## 2015-07-06 DIAGNOSIS — I10 Essential (primary) hypertension: Secondary | ICD-10-CM | POA: Diagnosis not present

## 2015-07-06 DIAGNOSIS — Z853 Personal history of malignant neoplasm of breast: Secondary | ICD-10-CM | POA: Diagnosis not present

## 2015-07-06 LAB — PARATHYROID HORMONE, INTACT (NO CA): PTH: 40 pg/mL (ref 15–65)

## 2015-07-06 NOTE — Progress Notes (Addendum)
STROKE TEAM PROGRESS NOTE   SUBJECTIVE (INTERVAL HISTORY) Her husband and daughter are at the bedside. pt is about to discharge. No acute issue overnight. No more seizure episodes. EEG done today no change from prior.   OBJECTIVE Temp:  [98.3 F (36.8 C)-98.6 F (37 C)] 98.4 F (36.9 C) (09/02 1617) Pulse Rate:  [67-98] 67 (09/02 1617) Cardiac Rhythm:  [-] Normal sinus rhythm (09/02 0700) Resp:  [17-20] 20 (09/02 1617) BP: (115-130)/(54-65) 117/65 mmHg (09/02 1617) SpO2:  [97 %-100 %] 100 % (09/02 1617)  CBC:  Recent Labs Lab 07/03/15 1616  07/04/15 0630 07/05/15 0432  WBC 7.5  --  7.5 5.0  NEUTROABS 4.3  --   --   --   HGB 10.1*  < > 9.5* 8.9*  HCT 31.0*  < > 29.7* 27.7*  MCV 93.9  --  93.4 93.6  PLT 152  --  164 158  < > = values in this interval not displayed.  Basic Metabolic Panel:   Recent Labs Lab 07/04/15 0630 07/05/15 0432  NA 132* 132*  K 3.6 3.4*  CL 100* 100*  CO2 24 24  GLUCOSE 91 114*  BUN 11 6  CREATININE 1.45* 1.23*  CALCIUM 9.8 9.6  PHOS  --  2.9    Lipid Panel:     Component Value Date/Time   CHOL 116 07/04/2015 1141   CHOL 187 09/14/2014   TRIG 59 07/04/2015 1141   TRIG 132 09/14/2014   HDL 38* 07/04/2015 1141   CHOLHDL 3.1 07/04/2015 1141   VLDL 12 07/04/2015 1141   LDLCALC 66 07/04/2015 1141   HgbA1c:  Lab Results  Component Value Date   HGBA1C 6.7* 07/04/2015   Urine Drug Screen: No results found for: LABOPIA, COCAINSCRNUR, LABBENZ, AMPHETMU, THCU, LABBARB    IMAGING  I have personally reviewed the radiological images below and agree with the radiology interpretations.  Ct Head Wo Contrast 07/03/2015 No acute intracranial abnormality. Atrophy, chronic microvascular disease. Probable remote right cerebellar lacunar infarct.   Mr Jodene Nam Head Wo Contrast 07/03/2015 Normal variant MRA circle of Willis without significant proximal stenosis, aneurysm, or branch vessel occlusion.   Mr Brain Wo Contrast 07/03/2015 1.  No acute intracranial abnormality. 2. Remote lacunar infarct of the right cerebellum.   CUS - Bilateral: 1-39% ICA stenosis. Vertebral artery flow is antegrade.  LE venous doppler - no DVT  2D echo - Left ventricle: The cavity size was normal. Wall thickness was normal. Systolic function was normal. The estimated ejection fraction was in the range of 60% to 65%. Wall motion was normal; there were no regional wall motion abnormalities. Doppler parameters are consistent with abnormal left ventricular relaxation (grade 1 diastolic dysfunction). - Mitral valve: Calcified annulus. Mildly thickened, mildly calcified leaflets . Impressions: - no cardiac source of emboli was indentified.  EEG 07/03/15- This is an abnormal EEG secondary to intermittent focal slowing in the hemisphere region on the right. This finding is suggestive of a focal disturbance that is etiologically nonspecific, but may include a mass lesion among other possibilities.   EEG 07/05/15 -  Impression: This awake and asleep EEG is abnormal due to the presence of: 1. Mild diffuse slowing of the background 2. Focal slowing over the left temporal region  Clinical Correlation of the above findings indicates bilateral cerebral dysfunction that is non-specific in etiology and can be seen with hypoxic/ischemic injury, toxic/metabolic encephalopathies, or medication effect. Focal slowing over the left temporal region indicates focal cerebral dysfunction in this  region, suggestive of underlying structural or physiologic abnormality. The absence of epileptiform discharges does not rule out a clinical diagnosis of epilepsy. Clinical correlation is advised.  PHYSICAL EXAM  Temp:  [98.3 F (36.8 C)-98.6 F (37 C)] 98.4 F (36.9 C) (09/02 1617) Pulse Rate:  [67-98] 67 (09/02 1617) Resp:  [17-20] 20 (09/02 1617) BP: (115-130)/(54-65) 117/65 mmHg (09/02 1617) SpO2:  [97 %-100 %] 100 % (09/02 1617)  General - Well  nourished, well developed, in no apparent distress.  Ophthalmologic - Fundi not visualized due to noncooperation.  Cardiovascular - Regular rate and rhythm.  Mental Status -  Level of arousal and orientation to place, and person were intact, but not orientated to time. Language including expression, repetition, comprehension was assessed and found intact, naming 3 out of 4. Fund of Knowledge was assessed and was impaired.  Cranial Nerves II - XII - II - Visual field intact OU. III, IV, VI - Extraocular movements intact. V - Facial sensation intact bilaterally. VII - Facial movement intact bilaterally. VIII - Hearing & vestibular intact bilaterally. X - Palate elevates symmetrically. XI - Chin turning & shoulder shrug intact bilaterally. XII - Tongue protrusion intact.  Motor Strength - The patient's strength was normal in all extremities and pronator drift was absent.  Bulk was normal and fasciculations were absent.   Motor Tone - Muscle tone was assessed at the neck and appendages and was normal.  Reflexes - The patient's reflexes were 1+ in all extremities and she had no pathological reflexes.  Sensory - Light touch, temperature/pinprick were assessed and were symmetrical.    Coordination - The patient had normal movements in the hands with no ataxia or dysmetria.  Tremor was absent  Gait and Station - deferred due to safety concerns.   ASSESSMENT/PLAN [name] is a 81 y.o. female with history of HTN, DMII, HLD, Alzheimers Dementia and h/o breast cancer presenting after a witnessed unresponsive, shaking episode. She did not receive IV t-PA due to resolved symptoms. Similar episode occurred again last night. Put on keppra.   Seizure - unclear etiology, could be multifactorial including hyperthyroidism, hyponatremia, worsening Cre, relatively low B12, recent pneumonia, and dementia. However, due to recurrence of seizure episode with abnormal EEG, recommend AEDs.    EEG focal slowing R hemisphere, repeat EEG no change  On Keppra 500 mg bid (loaded with 1000 mg). Continue keppra on discharge.  MRI No acute stroke  MRA Unremarkable   Carotid Doppler unremarkable  2D Echo  unremarkable   Lovenox 40 mg sq daily for VTE prophylaxis  aspirin 81 mg orally every day prior to admission, now on no antithrombotic. Resumed home ASA 81mg .  UA negative  Recent CXR with resolving PNA. Recommend to repeat CXR to evaluate PNA resolution.  B12 240, goal > 500. Ordered B12 1072mcg daily.  Low TSH and high FT4. May need to repeat and endo consult. Although hashimoto encephalitis mostly associated with hypothyroidism, agree with check thyroid antibodies.   Dementia  On aricept and namenda at home  Resumed home aricept and namenda  Essential Hypertension  Stable  Home meds - lisinopril  Hyperlipidemia  Home meds: pravachol 40, resumed on admission  LDL 66, on the goal < 70  Continue statin at discharge  Diabetes type II  HgbA1c 6.7, at goal < 7.0  Controlled  Other Active Problems  Hx breast cancer 10 years ago, cured  Recent hospitalization in July for [facility] day # 1  Neurology  will sign off. Please call with questions. Pt was discharged by teaching service requested [facility] follow up in one week. According to my opinion, pt should follow up with [facility] but in 1-2 months with any provider in [place]. Thanks for the consult.  Rosalin Hawking, MD PhD Stroke Neurology 07/06/2015 6:21 AM     To contact Stroke Continuity provider, please refer to [email or web address] After hours, contact General Neurology

## 2015-07-09 DIAGNOSIS — Z853 Personal history of malignant neoplasm of breast: Secondary | ICD-10-CM | POA: Diagnosis not present

## 2015-07-09 DIAGNOSIS — I10 Essential (primary) hypertension: Secondary | ICD-10-CM | POA: Diagnosis not present

## 2015-07-09 DIAGNOSIS — F0390 Unspecified dementia without behavioral disturbance: Secondary | ICD-10-CM | POA: Diagnosis not present

## 2015-07-09 DIAGNOSIS — E119 Type 2 diabetes mellitus without complications: Secondary | ICD-10-CM | POA: Diagnosis not present

## 2015-07-09 DIAGNOSIS — Z87891 Personal history of nicotine dependence: Secondary | ICD-10-CM | POA: Diagnosis not present

## 2015-07-09 DIAGNOSIS — E785 Hyperlipidemia, unspecified: Secondary | ICD-10-CM | POA: Diagnosis not present

## 2015-07-09 DIAGNOSIS — J189 Pneumonia, unspecified organism: Secondary | ICD-10-CM | POA: Diagnosis not present

## 2015-07-10 ENCOUNTER — Encounter: Payer: Self-pay | Admitting: Family Medicine

## 2015-07-10 ENCOUNTER — Ambulatory Visit (INDEPENDENT_AMBULATORY_CARE_PROVIDER_SITE_OTHER): Payer: Commercial Managed Care - HMO | Admitting: Family Medicine

## 2015-07-10 VITALS — BP 104/60 | HR 71 | Temp 98.2°F | Resp 16 | Wt 160.0 lb

## 2015-07-10 DIAGNOSIS — I10 Essential (primary) hypertension: Secondary | ICD-10-CM

## 2015-07-10 DIAGNOSIS — R946 Abnormal results of thyroid function studies: Secondary | ICD-10-CM

## 2015-07-10 DIAGNOSIS — G40309 Generalized idiopathic epilepsy and epileptic syndromes, not intractable, without status epilepticus: Secondary | ICD-10-CM | POA: Diagnosis not present

## 2015-07-10 DIAGNOSIS — D649 Anemia, unspecified: Secondary | ICD-10-CM

## 2015-07-10 DIAGNOSIS — G40409 Other generalized epilepsy and epileptic syndromes, not intractable, without status epilepticus: Secondary | ICD-10-CM

## 2015-07-10 MED ORDER — LISINOPRIL 40 MG PO TABS: 20.0000 mg | ORAL_TABLET | Freq: Every day | ORAL | Status: DC

## 2015-07-10 NOTE — Patient Instructions (Signed)
Reduce lisinopril 40mg  to 1/2 tablet every night.

## 2015-07-10 NOTE — Progress Notes (Signed)
Patient: Briana Austin Female    DOB: 02-13-1934   81 y.o.   MRN: [id number] Visit Date: 07/10/2015  Today's Provider: Lelon Huh, MD   Chief Complaint  Patient presents with  . Hospitalization Follow-up  . Seizures    follow- up  . Pneumonia    follow- up   Subjective:    HPI  Follow up Hospitalization  Patient was admitted to [facility] on 07/03/2015 and discharged on 07/05/2015. She was treated for Seizures Treatment for this included starting Keppra. She reports good compliance with treatment. She reports this condition is Improved. Patient daughter comes in today with patient reporting that patinet has been more sluggish and fatigued since starting Keppra.  Patient denies any shortness of breath,wheezing,  fever or chills Per discharge summary, patient was to follow up with her PCP Neurology after discharge. Patient has appointment scheduled to see Neurology on 07/25/2015.  She was also found to have mild hypercalcemia and thyroid abnormalities which were recommended to be follow up on.  Lab Results  Component Value Date   WBC 5.0 07/05/2015   HGB 8.9* 07/05/2015   HCT 27.7* 07/05/2015   MCV 93.6 07/05/2015   PLT 158 07/05/2015   Lab Results  Component Value Date   TSH 0.210* 07/04/2015   BMP Latest Ref Rng 07/05/2015 07/04/2015 07/03/2015  Glucose 65 - 99 mg/dL 114(H) 91 111(H)  BUN 6 - 20 mg/dL 6 11 20   Creatinine 0.44 - 1.00 mg/dL 1.23(H) 1.45(H) 1.60(H)  BUN/Creat Ratio 11 - 26 - - -  Sodium 135 - 145 mmol/L 132(L) 132(L) 130(L)  Potassium 3.5 - 5.1 mmol/L 3.4(L) 3.6 4.1  Chloride 101 - 111 mmol/L 100(L) 100(L) 95(L)  CO2 22 - 32 mmol/L 24 24 -  Calcium 8.9 - 10.3 mg/dL 9.6 9.8 -     ------------------------------------------------------------------------------------     No Known Allergies Previous Medications   ACIDOPHILUS (RISAQUAD) CAPS CAPSULE    Take 1 capsule by mouth 2 (two) times daily.   ASPIRIN EC 81 MG TABLET    Take 81  mg by mouth daily.   CALCIUM CARBONATE (OS-CAL) 600 MG TABS TABLET    Take 1 tablet by mouth daily.   DONEPEZIL (ARICEPT) 5 MG TABLET    Take 1 tablet by mouth at bedtime.    FEEDING SUPPLEMENT, ENSURE ENLIVE, (ENSURE ENLIVE) LIQD    Take 237 mLs by mouth 2 (two) times daily between meals.   GLIPIZIDE (GLUCOTROL XL) 5 MG 24 HR TABLET    Take 5 mg by mouth daily with breakfast.   LEVETIRACETAM (KEPPRA) 500 MG TABLET    Take 1 tablet (500 mg total) by mouth 2 (two) times daily.   LISINOPRIL (PRINIVIL,ZESTRIL) 40 MG TABLET    Take 1 tablet (40 mg total) by mouth daily.   MEMANTINE (NAMENDA XR) 28 MG CP24 24 HR CAPSULE    Take 1 capsule by mouth at bedtime.    METFORMIN (GLUMETZA) 500 MG (MOD) 24 HR TABLET    Take 500 mg by mouth daily with breakfast.   METOPROLOL-HYDROCHLOROTHIAZIDE (LOPRESSOR HCT) 100-50 MG PER TABLET    TAKE ONE TABLET BY MOUTH ONCE DAILY   POTASSIUM CHLORIDE (K-DUR,KLOR-CON) 10 MEQ TABLET    Take 1 tablet by mouth at bedtime.    PRAVASTATIN (PRAVACHOL) 40 MG TABLET    Take 1 tablet (40 mg total) by mouth daily.   VITAMIN B-12 1000 MCG TABLET    Take 1 tablet (1,000  mcg total) by mouth daily.   VITAMIN D, ERGOCALCIFEROL, (DRISDOL) 50000 UNITS CAPS CAPSULE    Take 1 capsule by mouth once a week. Take on Tuesday.    Review of Systems  Constitutional: Positive for fatigue. Negative for fever, chills and appetite change.  Respiratory: Negative for cough, chest tightness, shortness of breath and wheezing.   Cardiovascular: Negative for chest pain and palpitations.  Gastrointestinal: Negative for nausea, vomiting and abdominal pain.  Neurological: Negative for dizziness, seizures, syncope and weakness.  (none since discharge on 07-05-15)  Social History  Substance Use Topics  . Smoking status: Former Smoker -- 0.50 packs/day for 8 years    Types: Cigarettes  . Smokeless tobacco: Never Used  . Alcohol Use: No   Objective:   BP 104/60 mmHg  Pulse 71  Temp(Src) 98.2 F (36.8  C) (Oral)  Resp 16  Wt 160 lb (72.576 kg)  SpO2 97%  Physical Exam  General Appearance:    Alert, cooperative, no distress  Eyes:    PERRL, conjunctiva/corneas clear, EOM's intact       Lungs:     Clear to auscultation bilaterally, respirations unlabored  Heart:    Regular rate and rhythm  Neurologic:   Awake, alert, oriented x 1. No apparent focal neurological           defect.            Assessment & Plan:     1. Anemia, unspecified anemia type  - CBC - PTH, Intact and Calcium  2. Thyroid function test abnormal  - T4, free - TSH  3. Hypercalcemia  - Basic metabolic panel - PTH, Intact and Calcium  4. Essential hypertension  - lisinopril (PRINIVIL,ZESTRIL) 40 MG tablet; Take 0.5 tablets (20 mg total) by mouth at bedtime.  Dispense: 1 tablet; Refill: 0  5. New onset seizures.  Likely multifactorial. Generally tolerating keppra except for fatigue. Continue same dose for now. Follow up Dr. Krista Blue as scheduled on 07-25-2015. Call if any problems before then. \  6. Hypertension.  She is a little hypotensive and sluggish since discharge. Will reduce lisinopril to 1/2 tablet daily for now.       Lelon Huh, MD  [facility]  TSH, free T4, PTH, repeat CXR in 4-6 weeks, BMP

## 2015-07-11 DIAGNOSIS — J189 Pneumonia, unspecified organism: Secondary | ICD-10-CM | POA: Diagnosis not present

## 2015-07-11 DIAGNOSIS — Z853 Personal history of malignant neoplasm of breast: Secondary | ICD-10-CM | POA: Diagnosis not present

## 2015-07-11 DIAGNOSIS — F0390 Unspecified dementia without behavioral disturbance: Secondary | ICD-10-CM | POA: Diagnosis not present

## 2015-07-11 DIAGNOSIS — I10 Essential (primary) hypertension: Secondary | ICD-10-CM | POA: Diagnosis not present

## 2015-07-11 DIAGNOSIS — E119 Type 2 diabetes mellitus without complications: Secondary | ICD-10-CM | POA: Diagnosis not present

## 2015-07-11 DIAGNOSIS — E785 Hyperlipidemia, unspecified: Secondary | ICD-10-CM | POA: Diagnosis not present

## 2015-07-11 DIAGNOSIS — Z87891 Personal history of nicotine dependence: Secondary | ICD-10-CM | POA: Diagnosis not present

## 2015-07-11 LAB — METHYLMALONIC ACID, SERUM: METHYLMALONIC ACID, QUANTITATIVE: 141 nmol/L (ref 0–378)

## 2015-07-12 ENCOUNTER — Other Ambulatory Visit: Payer: Self-pay | Admitting: Family Medicine

## 2015-07-12 DIAGNOSIS — D649 Anemia, unspecified: Secondary | ICD-10-CM | POA: Diagnosis not present

## 2015-07-12 DIAGNOSIS — J189 Pneumonia, unspecified organism: Secondary | ICD-10-CM | POA: Diagnosis not present

## 2015-07-12 DIAGNOSIS — E119 Type 2 diabetes mellitus without complications: Secondary | ICD-10-CM | POA: Diagnosis not present

## 2015-07-12 DIAGNOSIS — Z87891 Personal history of nicotine dependence: Secondary | ICD-10-CM | POA: Diagnosis not present

## 2015-07-12 DIAGNOSIS — I10 Essential (primary) hypertension: Secondary | ICD-10-CM | POA: Diagnosis not present

## 2015-07-12 DIAGNOSIS — Z853 Personal history of malignant neoplasm of breast: Secondary | ICD-10-CM | POA: Diagnosis not present

## 2015-07-12 DIAGNOSIS — F0390 Unspecified dementia without behavioral disturbance: Secondary | ICD-10-CM | POA: Diagnosis not present

## 2015-07-12 DIAGNOSIS — E785 Hyperlipidemia, unspecified: Secondary | ICD-10-CM | POA: Diagnosis not present

## 2015-07-12 DIAGNOSIS — R946 Abnormal results of thyroid function studies: Secondary | ICD-10-CM | POA: Diagnosis not present

## 2015-07-12 MED ORDER — GLUCOSE BLOOD VI STRP
1.0000 | ORAL_STRIP | Status: DC | PRN
Start: 2015-07-12 — End: 2016-06-30

## 2015-07-12 MED ORDER — ONETOUCH LANCETS MISC: 1.0000 | Freq: Every day | Status: DC

## 2015-07-12 NOTE — Telephone Encounter (Signed)
Briana Austin with [facility] id requesting a Rx sent for a One Touch Verio Flex strips and lancets.  [place]  CB#[phone]/MW

## 2015-07-13 DIAGNOSIS — E785 Hyperlipidemia, unspecified: Secondary | ICD-10-CM | POA: Diagnosis not present

## 2015-07-13 DIAGNOSIS — F0390 Unspecified dementia without behavioral disturbance: Secondary | ICD-10-CM | POA: Diagnosis not present

## 2015-07-13 DIAGNOSIS — I10 Essential (primary) hypertension: Secondary | ICD-10-CM | POA: Diagnosis not present

## 2015-07-13 DIAGNOSIS — Z853 Personal history of malignant neoplasm of breast: Secondary | ICD-10-CM | POA: Diagnosis not present

## 2015-07-13 DIAGNOSIS — J189 Pneumonia, unspecified organism: Secondary | ICD-10-CM | POA: Diagnosis not present

## 2015-07-13 DIAGNOSIS — E119 Type 2 diabetes mellitus without complications: Secondary | ICD-10-CM | POA: Diagnosis not present

## 2015-07-13 DIAGNOSIS — Z87891 Personal history of nicotine dependence: Secondary | ICD-10-CM | POA: Diagnosis not present

## 2015-07-13 LAB — BASIC METABOLIC PANEL
BUN / CREAT RATIO: 12 (ref 11–26)
BUN: 14 mg/dL (ref 8–27)
CHLORIDE: 94 mmol/L — AB (ref 97–108)
CO2: 22 mmol/L (ref 18–29)
Calcium: 10.4 mg/dL — ABNORMAL HIGH (ref 8.7–10.3)
Creatinine, Ser: 1.21 mg/dL — ABNORMAL HIGH (ref 0.57–1.00)
GFR calc Af Amer: 49 mL/min/{1.73_m2} — ABNORMAL LOW (ref >59)
GFR calc non Af Amer: 43 mL/min/{1.73_m2} — ABNORMAL LOW (ref >59)
GLUCOSE: 93 mg/dL (ref 65–99)
POTASSIUM: 4.3 mmol/L (ref 3.5–5.2)
Sodium: 133 mmol/L — ABNORMAL LOW (ref 134–144)

## 2015-07-13 LAB — CBC
HEMATOCRIT: 32.3 % — AB (ref 34.0–46.6)
Hemoglobin: 10.2 g/dL — ABNORMAL LOW (ref 11.1–15.9)
MCH: 29.9 pg (ref 26.6–33.0)
MCHC: 31.6 g/dL (ref 31.5–35.7)
MCV: 95 fL (ref 79–97)
PLATELETS: 280 10*3/uL (ref 150–379)
RBC: 3.41 x10E6/uL — ABNORMAL LOW (ref 3.77–5.28)
RDW: 14.4 % (ref 12.3–15.4)
WBC: 5.4 10*3/uL (ref 3.4–10.8)

## 2015-07-13 LAB — TSH: TSH: 0.596 u[IU]/mL (ref 0.450–4.500)

## 2015-07-13 LAB — T4, FREE: FREE T4: 1.17 ng/dL (ref 0.82–1.77)

## 2015-07-13 LAB — PTH, INTACT AND CALCIUM: PTH: 32 pg/mL (ref 15–65)

## 2015-07-14 NOTE — Progress Notes (Signed)
Patient: Briana Austin Female    DOB: 1933/12/21   81 y.o.   MRN: [id number] Visit Date: 06/26/2015  Today's Provider: Lelon Huh, MD   Chief Complaint  Patient presents with  . Hospitalization Follow-up   Subjective:    HPI [name] was admitted [facility] for fever on 06/08/2015. Work up included negative blood cultures. Chest Ct revealed right lower lobe pneumonia. She was treated with appropriate antibiotics and discharged 06/14/2015. She has finished oral Levaquin prescribed at discharge. She has remained afebrile since discharge. Her energy level is slowly improving, but she is still not eating well. Has had mild non-productive cough since discharge.    No Known Allergies Previous Medications   ACIDOPHILUS (RISAQUAD) CAPS CAPSULE    Take 1 capsule by mouth 2 (two) times daily.   ASPIRIN EC 81 MG TABLET    Take 81 mg by mouth daily.   CALCIUM CARBONATE (OS-CAL) 600 MG TABS TABLET    Take 1 tablet by mouth daily.   DONEPEZIL (ARICEPT) 5 MG TABLET    Take 1 tablet by mouth at bedtime.    FEEDING SUPPLEMENT, ENSURE ENLIVE, (ENSURE ENLIVE) LIQD    Take 237 mLs by mouth 2 (two) times daily between meals.   GLIPIZIDE (GLUCOTROL XL) 5 MG 24 HR TABLET    Take 5 mg by mouth daily with breakfast.   MEMANTINE (NAMENDA XR) 28 MG CP24 24 HR CAPSULE    Take 1 capsule by mouth at bedtime.    METFORMIN (GLUMETZA) 500 MG (MOD) 24 HR TABLET    Take 500 mg by mouth daily with breakfast.   METOPROLOL-HYDROCHLOROTHIAZIDE (LOPRESSOR HCT) 100-50 MG PER TABLET    TAKE ONE TABLET BY MOUTH ONCE DAILY   POTASSIUM CHLORIDE (K-DUR,KLOR-CON) 10 MEQ TABLET    Take 1 tablet by mouth at bedtime.    PRAVASTATIN (PRAVACHOL) 40 MG TABLET    Take 1 tablet (40 mg total) by mouth daily.   VITAMIN D, ERGOCALCIFEROL, (DRISDOL) 50000 UNITS CAPS CAPSULE    Take 1 capsule by mouth once a week. Take on Tuesday.    Review of Systems  Constitutional: Negative for fever, chills, appetite change and fatigue.    Respiratory: Negative for chest tightness and shortness of breath.   Cardiovascular: Negative for chest pain and palpitations.  Gastrointestinal: Negative for nausea, vomiting and abdominal pain.  Neurological: Negative for dizziness and weakness.    Social History  Substance Use Topics  . Smoking status: Former Smoker -- 0.50 packs/day for 8 years    Types: Cigarettes  . Smokeless tobacco: Never Used  . Alcohol Use: No   Objective:   BP 120/60 mmHg  Pulse 88  Temp(Src) 98.9 F (37.2 C)  Resp 18  Wt 160 lb 9.6 oz (72.848 kg)  SpO2 97%  Physical Exam  General Appearance:    Alert, cooperative, no distress, obese  Eyes:    PERRL, conjunctiva/corneas clear, EOM's intact       Lungs:     Clear to auscultation bilaterally, respirations unlabored  Heart:    Regular rate and rhythm  Neurologic:   Awake, alert, oriented x 1 (baseline).          Assessment & Plan:     1. Pneumonia, unspecified laterality, unspecified part of lung Clinically resolved. Has finished antibiotic. Push fluids. Continue home physical therapy. Call if symptoms change or if not rapidly improving.        Lelon Huh, MD  [place]  [place]

## 2015-07-15 DIAGNOSIS — Z853 Personal history of malignant neoplasm of breast: Secondary | ICD-10-CM | POA: Diagnosis not present

## 2015-07-15 DIAGNOSIS — I10 Essential (primary) hypertension: Secondary | ICD-10-CM | POA: Diagnosis not present

## 2015-07-15 DIAGNOSIS — Z87891 Personal history of nicotine dependence: Secondary | ICD-10-CM | POA: Diagnosis not present

## 2015-07-15 DIAGNOSIS — J189 Pneumonia, unspecified organism: Secondary | ICD-10-CM | POA: Diagnosis not present

## 2015-07-15 DIAGNOSIS — E785 Hyperlipidemia, unspecified: Secondary | ICD-10-CM | POA: Diagnosis not present

## 2015-07-15 DIAGNOSIS — E119 Type 2 diabetes mellitus without complications: Secondary | ICD-10-CM | POA: Diagnosis not present

## 2015-07-15 DIAGNOSIS — F0390 Unspecified dementia without behavioral disturbance: Secondary | ICD-10-CM | POA: Diagnosis not present

## 2015-07-16 ENCOUNTER — Telehealth: Payer: Self-pay | Admitting: Family Medicine

## 2015-07-16 NOTE — Telephone Encounter (Signed)
That is not an unusual side effect, but usually improves with time. If not they will need to discuss alternatives when she sees the neurologist.

## 2015-07-16 NOTE — Telephone Encounter (Signed)
Ann with [facility] wanted to let us know that pt's husband told her that since pt started  Keppra that pt seems more fatigue. Ann stated we don't need to follow up with her but to follow up with the pt. Please TNP

## 2015-07-17 DIAGNOSIS — F0390 Unspecified dementia without behavioral disturbance: Secondary | ICD-10-CM | POA: Diagnosis not present

## 2015-07-17 DIAGNOSIS — J189 Pneumonia, unspecified organism: Secondary | ICD-10-CM | POA: Diagnosis not present

## 2015-07-17 DIAGNOSIS — I10 Essential (primary) hypertension: Secondary | ICD-10-CM | POA: Diagnosis not present

## 2015-07-17 DIAGNOSIS — E119 Type 2 diabetes mellitus without complications: Secondary | ICD-10-CM | POA: Diagnosis not present

## 2015-07-17 DIAGNOSIS — Z853 Personal history of malignant neoplasm of breast: Secondary | ICD-10-CM | POA: Diagnosis not present

## 2015-07-17 DIAGNOSIS — E785 Hyperlipidemia, unspecified: Secondary | ICD-10-CM | POA: Diagnosis not present

## 2015-07-17 DIAGNOSIS — Z87891 Personal history of nicotine dependence: Secondary | ICD-10-CM | POA: Diagnosis not present

## 2015-07-17 NOTE — Telephone Encounter (Signed)
Advised pt's spouse, [name], as directed below. [name] verbalized fully understanding. He also stated that she does have an appointment coming up on 07/25/2015.  Thanks,

## 2015-07-18 DIAGNOSIS — E785 Hyperlipidemia, unspecified: Secondary | ICD-10-CM | POA: Diagnosis not present

## 2015-07-18 DIAGNOSIS — I10 Essential (primary) hypertension: Secondary | ICD-10-CM | POA: Diagnosis not present

## 2015-07-18 DIAGNOSIS — J189 Pneumonia, unspecified organism: Secondary | ICD-10-CM | POA: Diagnosis not present

## 2015-07-18 DIAGNOSIS — Z853 Personal history of malignant neoplasm of breast: Secondary | ICD-10-CM | POA: Diagnosis not present

## 2015-07-18 DIAGNOSIS — F0390 Unspecified dementia without behavioral disturbance: Secondary | ICD-10-CM | POA: Diagnosis not present

## 2015-07-18 DIAGNOSIS — E119 Type 2 diabetes mellitus without complications: Secondary | ICD-10-CM | POA: Diagnosis not present

## 2015-07-18 DIAGNOSIS — Z87891 Personal history of nicotine dependence: Secondary | ICD-10-CM | POA: Diagnosis not present

## 2015-07-19 DIAGNOSIS — Z853 Personal history of malignant neoplasm of breast: Secondary | ICD-10-CM | POA: Diagnosis not present

## 2015-07-19 DIAGNOSIS — J189 Pneumonia, unspecified organism: Secondary | ICD-10-CM | POA: Diagnosis not present

## 2015-07-19 DIAGNOSIS — E785 Hyperlipidemia, unspecified: Secondary | ICD-10-CM | POA: Diagnosis not present

## 2015-07-19 DIAGNOSIS — F0390 Unspecified dementia without behavioral disturbance: Secondary | ICD-10-CM | POA: Diagnosis not present

## 2015-07-19 DIAGNOSIS — E119 Type 2 diabetes mellitus without complications: Secondary | ICD-10-CM | POA: Diagnosis not present

## 2015-07-19 DIAGNOSIS — I10 Essential (primary) hypertension: Secondary | ICD-10-CM | POA: Diagnosis not present

## 2015-07-19 DIAGNOSIS — Z87891 Personal history of nicotine dependence: Secondary | ICD-10-CM | POA: Diagnosis not present

## 2015-07-20 DIAGNOSIS — I10 Essential (primary) hypertension: Secondary | ICD-10-CM | POA: Diagnosis not present

## 2015-07-20 DIAGNOSIS — E119 Type 2 diabetes mellitus without complications: Secondary | ICD-10-CM | POA: Diagnosis not present

## 2015-07-20 DIAGNOSIS — Z87891 Personal history of nicotine dependence: Secondary | ICD-10-CM | POA: Diagnosis not present

## 2015-07-20 DIAGNOSIS — F0390 Unspecified dementia without behavioral disturbance: Secondary | ICD-10-CM | POA: Diagnosis not present

## 2015-07-20 DIAGNOSIS — Z853 Personal history of malignant neoplasm of breast: Secondary | ICD-10-CM | POA: Diagnosis not present

## 2015-07-20 DIAGNOSIS — J189 Pneumonia, unspecified organism: Secondary | ICD-10-CM | POA: Diagnosis not present

## 2015-07-20 DIAGNOSIS — E785 Hyperlipidemia, unspecified: Secondary | ICD-10-CM | POA: Diagnosis not present

## 2015-07-23 DIAGNOSIS — I10 Essential (primary) hypertension: Secondary | ICD-10-CM | POA: Diagnosis not present

## 2015-07-23 DIAGNOSIS — Z853 Personal history of malignant neoplasm of breast: Secondary | ICD-10-CM | POA: Diagnosis not present

## 2015-07-23 DIAGNOSIS — E785 Hyperlipidemia, unspecified: Secondary | ICD-10-CM | POA: Diagnosis not present

## 2015-07-23 DIAGNOSIS — F0390 Unspecified dementia without behavioral disturbance: Secondary | ICD-10-CM | POA: Diagnosis not present

## 2015-07-23 DIAGNOSIS — Z87891 Personal history of nicotine dependence: Secondary | ICD-10-CM | POA: Diagnosis not present

## 2015-07-23 DIAGNOSIS — J189 Pneumonia, unspecified organism: Secondary | ICD-10-CM | POA: Diagnosis not present

## 2015-07-23 DIAGNOSIS — E119 Type 2 diabetes mellitus without complications: Secondary | ICD-10-CM | POA: Diagnosis not present

## 2015-07-24 ENCOUNTER — Telehealth: Payer: Self-pay | Admitting: Family Medicine

## 2015-07-24 DIAGNOSIS — I10 Essential (primary) hypertension: Secondary | ICD-10-CM | POA: Diagnosis not present

## 2015-07-24 DIAGNOSIS — F0390 Unspecified dementia without behavioral disturbance: Secondary | ICD-10-CM | POA: Diagnosis not present

## 2015-07-24 DIAGNOSIS — Z87891 Personal history of nicotine dependence: Secondary | ICD-10-CM | POA: Diagnosis not present

## 2015-07-24 DIAGNOSIS — J189 Pneumonia, unspecified organism: Secondary | ICD-10-CM | POA: Diagnosis not present

## 2015-07-24 DIAGNOSIS — E785 Hyperlipidemia, unspecified: Secondary | ICD-10-CM | POA: Diagnosis not present

## 2015-07-24 DIAGNOSIS — E119 Type 2 diabetes mellitus without complications: Secondary | ICD-10-CM | POA: Diagnosis not present

## 2015-07-24 DIAGNOSIS — Z853 Personal history of malignant neoplasm of breast: Secondary | ICD-10-CM | POA: Diagnosis not present

## 2015-07-24 NOTE — Telephone Encounter (Signed)
Linda with [facility] states pt blood pressure is low, 94/54 in right arm.  Pt is not having dizziness while standing. Pt is taking Lisinopril 20mg  daily and Metoprolol-HCTZ 100-50mg  daily.  Do any changes need to be made?  What base line should blood pressure be?  CB#[phone]/MW

## 2015-07-24 NOTE — Telephone Encounter (Signed)
Briana Austin notified of dose change.

## 2015-07-24 NOTE — Telephone Encounter (Signed)
Reduce metoprolol-hctz to 1/2 tablet daily. O.V. If any dizziness fever or light headedness.

## 2015-07-25 ENCOUNTER — Ambulatory Visit (INDEPENDENT_AMBULATORY_CARE_PROVIDER_SITE_OTHER): Payer: Commercial Managed Care - HMO | Admitting: Neurology

## 2015-07-25 ENCOUNTER — Encounter: Payer: Self-pay | Admitting: Neurology

## 2015-07-25 VITALS — BP 115/67 | HR 61 | Ht 68.0 in | Wt 160.0 lb

## 2015-07-25 DIAGNOSIS — G40209 Localization-related (focal) (partial) symptomatic epilepsy and epileptic syndromes with complex partial seizures, not intractable, without status epilepticus: Secondary | ICD-10-CM

## 2015-07-25 DIAGNOSIS — F0390 Unspecified dementia without behavioral disturbance: Secondary | ICD-10-CM | POA: Diagnosis not present

## 2015-07-25 MED ORDER — LEVETIRACETAM 500 MG PO TABS: 500.0000 mg | ORAL_TABLET | Freq: Two times a day (BID) | ORAL | Status: DC

## 2015-07-25 NOTE — Progress Notes (Signed)
PATIENT: Briana Austin DOB: January 04, 1934  Chief Complaint  Patient presents with  . Seizures    She is here with her husband, [name], and her daughter, [name]. Her family reports that at approximately 2 pm on 07/03/15, the patient became unresponsive, her eyes rolled back, she became stiff, and started shaking. The episode lasted a couple minutes. She had one further episode while in the ED.     HISTORICAL  Briana Austin is a 81 yo RH female, seen in refer by Dr. Birdie Sons for evaluation of seizure.  She has past medical history of dementia, diabetes, hypertension, hyperlipidemia, had one seizure August 30 first 2016, was weakness by her husband, she had sudden onset right hand posturing, forceful head turning to the left side, body jerking, she was taken by ambulance to the emergency room, in August 30 first 7 AM, she had another recurrent episodes, similar presentation at [facility]  She was loaded with Keppra 500 mg twice a day, family noticed increased drowsiness, no recurrent seizure.  At baseline, she is forgetful, needing help bathing, still able to carry on a conversation, she retired from Psychologist, educational job, ambulate with a walker. There was no agitation, mild gait difficulty, no bowel bladder incontinence.  I have reviewed MRI of the brain without contrast July 03 2015:No acute intracranial abnormality. Remote lacunar infarct of the right cerebellum.  Normal variant MRA circle of Willis without significant proximal stenosis, aneurysm, or branch vessel occlusion.  EEG September second 2016:  Mild diffuse slowing of the background. Focal slowing over the left temporal region  REVIEW OF SYSTEMS: Full 14 system review of systems performed and notable only for snoring, incontinence, easy bruising, memory loss, weakness, decreased energy, disinteresting activities, fatigue  ALLERGIES: No Known Allergies  HOME MEDICATIONS: Current Outpatient Prescriptions  Medication Sig  Dispense Refill  . acidophilus (RISAQUAD) CAPS capsule Take 1 capsule by mouth 2 (two) times daily. 60 capsule 0  . aspirin EC 81 MG tablet Take 81 mg by mouth daily.    . calcium carbonate (OS-CAL) 600 MG TABS tablet Take 1 tablet by mouth daily.    Marland Kitchen donepezil (ARICEPT) 5 MG tablet Take 1 tablet by mouth at bedtime.     . feeding supplement, ENSURE ENLIVE, (ENSURE ENLIVE) LIQD Take 237 mLs by mouth 2 (two) times daily between meals. 237 mL 12  . glipiZIDE (GLUCOTROL XL) 5 MG 24 hr tablet Take 5 mg by mouth daily with breakfast.    . glucose blood (ONETOUCH VERIO) test strip 1 each by Other route as needed for other. Use as instructed 100 each 4  . levETIRAcetam (KEPPRA) 500 MG tablet Take 1 tablet (500 mg total) by mouth 2 (two) times daily. 60 tablet 1  . lisinopril (PRINIVIL,ZESTRIL) 40 MG tablet Take 0.5 tablets (20 mg total) by mouth at bedtime. 1 tablet 0  . memantine (NAMENDA XR) 28 MG CP24 24 hr capsule Take 1 capsule by mouth at bedtime.     . metFORMIN (GLUMETZA) 500 MG (MOD) 24 hr tablet Take 500 mg by mouth daily with breakfast.    . metoprolol-hydrochlorothiazide (LOPRESSOR HCT) 100-50 MG per tablet TAKE ONE TABLET BY MOUTH ONCE DAILY (Patient taking differently: TAKE ONE-HALF TABLET BY MOUTH ONCE DAILY AT BEDTIME) 30 tablet 12  . ONE TOUCH LANCETS MISC 1 Device by Does not apply route daily. 100 each 4  . potassium chloride (K-DUR,KLOR-CON) 10 MEQ tablet Take 1 tablet by mouth at bedtime.     Marland Kitchen  pravastatin (PRAVACHOL) 40 MG tablet Take 1 tablet (40 mg total) by mouth daily. (Patient taking differently: Take 40 mg by mouth at bedtime. ) 30 tablet 12  . vitamin B-12 1000 MCG tablet Take 1 tablet (1,000 mcg total) by mouth daily. 30 tablet 1  . Vitamin D, Ergocalciferol, (DRISDOL) 50000 UNITS CAPS capsule Take 1 capsule by mouth once a week. Take on Tuesday.     No current facility-administered medications for this visit.    PAST MEDICAL HISTORY: Past Medical History  Diagnosis  Date  . History of breast cancer   . Dementia   . Diabetes mellitus without complication   . Hypertension   . Cancer   . Seizure     PAST SURGICAL HISTORY: Past Surgical History  Procedure Laterality Date  . Sigmoid polyp excision  11/24/2004    Hyperplastic polyp  . Breast lumpectomy Right 2005    Carcinoma in situ of right breast  . Total abdominal hysterectomy w/ bilateral salpingoophorectomy  1981    Benign    FAMILY HISTORY: Family History  Problem Relation Age of Onset  . Stroke Mother   . Dementia Brother     SOCIAL HISTORY:  Social History   Social History  . Marital Status: Married    Spouse Name: N/A  . Number of Children: 1  . Years of Education: HS Grad   Occupational History  . Retired     [employer]   Social History Main Topics  . Smoking status: Former Smoker -- 0.50 packs/day for 8 years    Types: Cigarettes  . Smokeless tobacco: Never Used  . Alcohol Use: No  . Drug Use: No  . Sexual Activity: No   Other Topics Concern  . Not on file   Social History Narrative   Lives at home with her husband.   Right-handed.   1 cup caffeine daily.     PHYSICAL EXAM   Filed Vitals:   07/25/15 0806  BP: 115/67  Pulse: 61  Height: 5\' 8"  (1.727 m)  Weight: 160 lb (72.576 kg)    Not recorded      Body mass index is 24.33 kg/(m^2).  PHYSICAL EXAMNIATION:  Gen: NAD, conversant, well nourised, obese, well groomed                     Cardiovascular: Regular rate rhythm, no peripheral edema, warm, nontender. Eyes: Conjunctivae clear without exudates or hemorrhage Neck: Supple, no carotid bruise. Pulmonary: Clear to auscultation bilaterally   NEUROLOGICAL EXAM:  MENTAL STATUS: Speech:    Speech is normal; fluent and spontaneous with normal comprehension.   Cognition:Mini-Mental Status Examination is 15/30     Orientation to time, place and person     Recent and remote memory: missed 3/3 recalls      Attention span and  concentration: could not spell WORLD backwards.     Normal Language, naming, repeating,spontaneous speech     Could not copy design   CRANIAL NERVES: CN II: Visual fields are full to confrontation. Fundoscopic exam is normal with sharp discs. Pupils are round equal and briskly reactive to light. CN III, IV, VI: extraocular movement are normal. No ptosis. CN V: Facial sensation is intact to pinprick in all 3 divisions bilaterally. Corneal responses are intact.  CN VII: Face is symmetric with normal eye closure and smile. CN VIII: Hearing is normal to rubbing fingers CN IX, X: Palate elevates symmetrically. Phonation is normal. CN XI: Head turning and  shoulder shrug are intact CN XII: Tongue is midline with normal movements and no atrophy.  MOTOR: There is no pronator drift of out-stretched arms. Muscle bulk and tone are normal. Muscle strength is normal.  REFLEXES: Reflexes are 2+ and symmetric at the biceps, triceps, knees, and ankles. Plantar responses are flexor.  SENSORY: Intact to light touch, pinprick, position sense, and vibration sense are intact in fingers and toes.  COORDINATION: Rapid alternating movements and fine finger movements are intact. There is no dysmetria on finger-to-nose and heel-knee-shin.    GAIT/STANCE: Need assistant to get up from seated position, cautious, mildly unsteady   DIAGNOSTIC DATA (LABS, IMAGING, TESTING) - I reviewed patient records, labs, notes, testing and imaging myself where available.   ASSESSMENT AND PLAN  TAKIRAH BINFORD is a 81 y.o. female   Dementia  MMSE 15/30 today  Continue Namenda 10 mg 3 times a day Complex partial seizure  Keep Keppra 500 mg twice a day  Marcial Pacas, M.D. Ph.D.  [facility] [address] [place], [place] [address] Ph: [phone] Fax: [phone]  CC: Birdie Sons, MD

## 2015-07-27 DIAGNOSIS — J189 Pneumonia, unspecified organism: Secondary | ICD-10-CM | POA: Diagnosis not present

## 2015-07-27 DIAGNOSIS — E119 Type 2 diabetes mellitus without complications: Secondary | ICD-10-CM | POA: Diagnosis not present

## 2015-07-27 DIAGNOSIS — Z87891 Personal history of nicotine dependence: Secondary | ICD-10-CM | POA: Diagnosis not present

## 2015-07-27 DIAGNOSIS — Z853 Personal history of malignant neoplasm of breast: Secondary | ICD-10-CM | POA: Diagnosis not present

## 2015-07-27 DIAGNOSIS — F0390 Unspecified dementia without behavioral disturbance: Secondary | ICD-10-CM | POA: Diagnosis not present

## 2015-07-27 DIAGNOSIS — I10 Essential (primary) hypertension: Secondary | ICD-10-CM | POA: Diagnosis not present

## 2015-07-27 DIAGNOSIS — E785 Hyperlipidemia, unspecified: Secondary | ICD-10-CM | POA: Diagnosis not present

## 2015-07-29 DIAGNOSIS — E119 Type 2 diabetes mellitus without complications: Secondary | ICD-10-CM | POA: Diagnosis not present

## 2015-07-29 DIAGNOSIS — E785 Hyperlipidemia, unspecified: Secondary | ICD-10-CM | POA: Diagnosis not present

## 2015-07-29 DIAGNOSIS — I10 Essential (primary) hypertension: Secondary | ICD-10-CM | POA: Diagnosis not present

## 2015-07-29 DIAGNOSIS — J189 Pneumonia, unspecified organism: Secondary | ICD-10-CM | POA: Diagnosis not present

## 2015-07-29 DIAGNOSIS — Z853 Personal history of malignant neoplasm of breast: Secondary | ICD-10-CM | POA: Diagnosis not present

## 2015-07-29 DIAGNOSIS — F0390 Unspecified dementia without behavioral disturbance: Secondary | ICD-10-CM | POA: Diagnosis not present

## 2015-07-29 DIAGNOSIS — Z87891 Personal history of nicotine dependence: Secondary | ICD-10-CM | POA: Diagnosis not present

## 2015-07-30 DIAGNOSIS — Z853 Personal history of malignant neoplasm of breast: Secondary | ICD-10-CM | POA: Diagnosis not present

## 2015-07-30 DIAGNOSIS — Z87891 Personal history of nicotine dependence: Secondary | ICD-10-CM | POA: Diagnosis not present

## 2015-07-30 DIAGNOSIS — F0390 Unspecified dementia without behavioral disturbance: Secondary | ICD-10-CM | POA: Diagnosis not present

## 2015-07-30 DIAGNOSIS — J189 Pneumonia, unspecified organism: Secondary | ICD-10-CM | POA: Diagnosis not present

## 2015-07-30 DIAGNOSIS — E119 Type 2 diabetes mellitus without complications: Secondary | ICD-10-CM | POA: Diagnosis not present

## 2015-07-30 DIAGNOSIS — E785 Hyperlipidemia, unspecified: Secondary | ICD-10-CM | POA: Diagnosis not present

## 2015-07-30 DIAGNOSIS — I10 Essential (primary) hypertension: Secondary | ICD-10-CM | POA: Diagnosis not present

## 2015-08-01 DIAGNOSIS — I10 Essential (primary) hypertension: Secondary | ICD-10-CM | POA: Diagnosis not present

## 2015-08-01 DIAGNOSIS — E119 Type 2 diabetes mellitus without complications: Secondary | ICD-10-CM | POA: Diagnosis not present

## 2015-08-01 DIAGNOSIS — F0390 Unspecified dementia without behavioral disturbance: Secondary | ICD-10-CM | POA: Diagnosis not present

## 2015-08-01 DIAGNOSIS — Z87891 Personal history of nicotine dependence: Secondary | ICD-10-CM | POA: Diagnosis not present

## 2015-08-01 DIAGNOSIS — J189 Pneumonia, unspecified organism: Secondary | ICD-10-CM | POA: Diagnosis not present

## 2015-08-01 DIAGNOSIS — Z853 Personal history of malignant neoplasm of breast: Secondary | ICD-10-CM | POA: Diagnosis not present

## 2015-08-01 DIAGNOSIS — E785 Hyperlipidemia, unspecified: Secondary | ICD-10-CM | POA: Diagnosis not present

## 2015-08-06 DIAGNOSIS — J189 Pneumonia, unspecified organism: Secondary | ICD-10-CM | POA: Diagnosis not present

## 2015-08-06 DIAGNOSIS — Z853 Personal history of malignant neoplasm of breast: Secondary | ICD-10-CM | POA: Diagnosis not present

## 2015-08-06 DIAGNOSIS — E119 Type 2 diabetes mellitus without complications: Secondary | ICD-10-CM | POA: Diagnosis not present

## 2015-08-06 DIAGNOSIS — E785 Hyperlipidemia, unspecified: Secondary | ICD-10-CM | POA: Diagnosis not present

## 2015-08-06 DIAGNOSIS — Z87891 Personal history of nicotine dependence: Secondary | ICD-10-CM | POA: Diagnosis not present

## 2015-08-06 DIAGNOSIS — I10 Essential (primary) hypertension: Secondary | ICD-10-CM | POA: Diagnosis not present

## 2015-08-06 DIAGNOSIS — F0390 Unspecified dementia without behavioral disturbance: Secondary | ICD-10-CM | POA: Diagnosis not present

## 2015-08-09 DIAGNOSIS — F0390 Unspecified dementia without behavioral disturbance: Secondary | ICD-10-CM | POA: Diagnosis not present

## 2015-08-09 DIAGNOSIS — E119 Type 2 diabetes mellitus without complications: Secondary | ICD-10-CM | POA: Diagnosis not present

## 2015-08-09 DIAGNOSIS — I10 Essential (primary) hypertension: Secondary | ICD-10-CM | POA: Diagnosis not present

## 2015-08-09 DIAGNOSIS — E785 Hyperlipidemia, unspecified: Secondary | ICD-10-CM | POA: Diagnosis not present

## 2015-08-09 DIAGNOSIS — J189 Pneumonia, unspecified organism: Secondary | ICD-10-CM | POA: Diagnosis not present

## 2015-08-09 DIAGNOSIS — Z853 Personal history of malignant neoplasm of breast: Secondary | ICD-10-CM | POA: Diagnosis not present

## 2015-08-09 DIAGNOSIS — Z87891 Personal history of nicotine dependence: Secondary | ICD-10-CM | POA: Diagnosis not present

## 2015-08-12 DIAGNOSIS — E785 Hyperlipidemia, unspecified: Secondary | ICD-10-CM | POA: Diagnosis not present

## 2015-08-12 DIAGNOSIS — F0390 Unspecified dementia without behavioral disturbance: Secondary | ICD-10-CM | POA: Diagnosis not present

## 2015-08-12 DIAGNOSIS — I10 Essential (primary) hypertension: Secondary | ICD-10-CM | POA: Diagnosis not present

## 2015-08-12 DIAGNOSIS — E119 Type 2 diabetes mellitus without complications: Secondary | ICD-10-CM | POA: Diagnosis not present

## 2015-08-12 DIAGNOSIS — J189 Pneumonia, unspecified organism: Secondary | ICD-10-CM | POA: Diagnosis not present

## 2015-08-12 DIAGNOSIS — Z87891 Personal history of nicotine dependence: Secondary | ICD-10-CM | POA: Diagnosis not present

## 2015-08-12 DIAGNOSIS — Z853 Personal history of malignant neoplasm of breast: Secondary | ICD-10-CM | POA: Diagnosis not present

## 2015-08-13 ENCOUNTER — Ambulatory Visit (INDEPENDENT_AMBULATORY_CARE_PROVIDER_SITE_OTHER): Payer: Commercial Managed Care - HMO | Admitting: Family Medicine

## 2015-08-13 ENCOUNTER — Ambulatory Visit: Payer: Commercial Managed Care - HMO | Admitting: Family Medicine

## 2015-08-13 ENCOUNTER — Encounter: Payer: Self-pay | Admitting: Family Medicine

## 2015-08-13 VITALS — BP 120/60 | HR 84 | Temp 98.7°F | Resp 16 | Wt 170.6 lb

## 2015-08-13 DIAGNOSIS — Z23 Encounter for immunization: Secondary | ICD-10-CM | POA: Diagnosis not present

## 2015-08-13 DIAGNOSIS — I10 Essential (primary) hypertension: Secondary | ICD-10-CM

## 2015-08-13 DIAGNOSIS — E119 Type 2 diabetes mellitus without complications: Secondary | ICD-10-CM | POA: Diagnosis not present

## 2015-08-13 DIAGNOSIS — G309 Alzheimer's disease, unspecified: Secondary | ICD-10-CM

## 2015-08-13 DIAGNOSIS — F0280 Dementia in other diseases classified elsewhere without behavioral disturbance: Secondary | ICD-10-CM | POA: Diagnosis not present

## 2015-08-13 NOTE — Progress Notes (Signed)
Patient: Briana Austin Female    DOB: [birth year]   81 y.o.   MRN: [id number] Visit Date: 08/13/2015  Today's Provider: Lelon Huh, MD   Chief Complaint  Patient presents with     . Dementia  . Diabetes  . Hypertension   Subjective:    HPI   Diabetes Mellitus Type II, Follow-up:   Lab Results  Component Value Date   HGBA1C 6.7* 07/04/2015   HGBA1C 6.4 04/17/2015   HGBA1C 6.5 01/10/2015   Last seen for diabetes 4 months ago.  Management since then includes none. She reports good compliance with treatment. She is not having side effects.     Episodes of hypoglycemia? no  Weight trend: stable   ----------------------------------------------------------------------    Hypertension, follow-up:  BP Readings from Last 3 Encounters:  08/13/15 120/60  07/25/15 115/67  07/10/15 104/60    She was last seen for hypertension 4 months ago.  BP at that visit was 114/62. Management since that visit includes none .She reports good compliance with treatment. She is not having side effects.    ----------------------------------------------------------------------   Dementia  Is tolerating donepezil and Namenda well without adverse effects. Cognition has been a little better since recovering from pneumonia earlier this year.  Is eating and drinking well. Sleeping well.   No Known Allergies Previous Medications   ACIDOPHILUS (RISAQUAD) CAPS CAPSULE    Take 1 capsule by mouth 2 (two) times daily.   ASPIRIN EC 81 MG TABLET    Take 81 mg by mouth daily.   CALCIUM CARBONATE (OS-CAL) 600 MG TABS TABLET    Take 1 tablet by mouth daily.   DONEPEZIL (ARICEPT) 5 MG TABLET    Take 1 tablet by mouth at bedtime.    FEEDING SUPPLEMENT, ENSURE ENLIVE, (ENSURE ENLIVE) LIQD    Take 237 mLs by mouth 2 (two) times daily between meals.   GLIPIZIDE (GLUCOTROL XL) 5 MG 24 HR TABLET    Take 5 mg by mouth daily with breakfast.   GLUCOSE BLOOD (ONETOUCH VERIO) TEST STRIP    1  each by Other route as needed for other. Use as instructed   LEVETIRACETAM (KEPPRA) 500 MG TABLET    Take 1 tablet (500 mg total) by mouth 2 (two) times daily.   LISINOPRIL (PRINIVIL,ZESTRIL) 40 MG TABLET    Take 0.5 tablets (20 mg total) by mouth at bedtime.   MEMANTINE (NAMENDA XR) 28 MG CP24 24 HR CAPSULE    Take 1 capsule by mouth at bedtime.    METFORMIN (GLUMETZA) 500 MG (MOD) 24 HR TABLET    Take 500 mg by mouth daily with breakfast.   METOPROLOL-HYDROCHLOROTHIAZIDE (LOPRESSOR HCT) 100-50 MG PER TABLET    TAKE ONE TABLET BY MOUTH ONCE DAILY   ONE TOUCH LANCETS MISC    1 Device by Does not apply route daily.   POTASSIUM CHLORIDE (K-DUR,KLOR-CON) 10 MEQ TABLET    Take 1 tablet by mouth at bedtime.    PRAVASTATIN (PRAVACHOL) 40 MG TABLET    Take 1 tablet (40 mg total) by mouth daily.   VITAMIN B-12 1000 MCG TABLET    Take 1 tablet (1,000 mcg total) by mouth daily.   VITAMIN D, ERGOCALCIFEROL, (DRISDOL) 50000 UNITS CAPS CAPSULE    Take 1 capsule by mouth once a week. Take on Tuesday.    Review of Systems  Constitutional: Negative for fever, chills, appetite change and fatigue.  Respiratory: Negative for chest tightness and shortness of breath.  Cardiovascular: Negative.  Negative for chest pain and palpitations.  Gastrointestinal: Negative for nausea, vomiting and abdominal pain.  Neurological: Negative for dizziness, weakness, light-headedness and headaches.    Social History  Substance Use Topics  . Smoking status: Former Smoker -- 0.50 packs/day for 8 years    Types: Cigarettes  . Smokeless tobacco: Never Used  . Alcohol Use: No   Objective:   BP 120/60 mmHg  Pulse 84  Temp(Src) 98.7 F (37.1 C) (Oral)  Resp 16  Wt 170 lb 9.6 oz (77.384 kg)  Physical Exam   General Appearance:    Alert, cooperative, no distress, oriented to person only.   Eyes:    PERRL, conjunctiva/corneas clear, EOM's intact       Lungs:     Clear to auscultation bilaterally, respirations unlabored    Heart:    Regular rate and rhythm  Neurologic:    No apparent focal neurological           defect.           Assessment & Plan:     1. Dementia in Alzheimer's disease Stable. Continue current medications  2. Type 2 diabetes mellitus without complication, without long-term current use of insulin (HCC) Well controlled.  Continue current medications.   - POCT glycosylated hemoglobin (Hb A1C)  3. Essential hypertension Well controlled.  Continue current medications.    4. Need for influenza vaccination -HD Fluzone   Return in about 3 months (around 11/13/2015).       Lelon Huh, MD  [facility]

## 2015-09-24 ENCOUNTER — Telehealth: Payer: Self-pay | Admitting: Family Medicine

## 2015-09-24 NOTE — Telephone Encounter (Signed)
Try OTC Claritin (loratadine) 10mg  a day, along with OTC Mucinex

## 2015-09-24 NOTE — Telephone Encounter (Signed)
Returned Briana Austin's call. [name] stated that patient does not have any cold symptoms. However pt has had a lot of clear mucus that stays in the back of her throat all the time. Patient is always having to clear her throat. [name] wanted to know if there is anything that she could take that would help?

## 2015-09-24 NOTE — Telephone Encounter (Signed)
Patient notified

## 2015-09-24 NOTE — Telephone Encounter (Signed)
Briana Austin wanted to know if there was something pt could take to help dry up the clear mucus that pt is spitting out. Pharmacy: [facility]. Thanks TNP

## 2015-10-18 ENCOUNTER — Other Ambulatory Visit: Payer: Self-pay | Admitting: Family Medicine

## 2015-10-18 ENCOUNTER — Ambulatory Visit (INDEPENDENT_AMBULATORY_CARE_PROVIDER_SITE_OTHER): Payer: Commercial Managed Care - HMO | Admitting: Family Medicine

## 2015-10-18 ENCOUNTER — Encounter: Payer: Self-pay | Admitting: Family Medicine

## 2015-10-18 VITALS — BP 124/70 | HR 69 | Temp 98.7°F | Resp 16 | Ht 67.25 in | Wt 180.0 lb

## 2015-10-18 DIAGNOSIS — Z0000 Encounter for general adult medical examination without abnormal findings: Secondary | ICD-10-CM

## 2015-10-18 DIAGNOSIS — E2839 Other primary ovarian failure: Secondary | ICD-10-CM | POA: Diagnosis not present

## 2015-10-18 DIAGNOSIS — F0280 Dementia in other diseases classified elsewhere without behavioral disturbance: Secondary | ICD-10-CM

## 2015-10-18 DIAGNOSIS — G309 Alzheimer's disease, unspecified: Secondary | ICD-10-CM

## 2015-10-18 DIAGNOSIS — Z1231 Encounter for screening mammogram for malignant neoplasm of breast: Secondary | ICD-10-CM

## 2015-10-18 DIAGNOSIS — I10 Essential (primary) hypertension: Secondary | ICD-10-CM

## 2015-10-18 DIAGNOSIS — E119 Type 2 diabetes mellitus without complications: Secondary | ICD-10-CM

## 2015-10-18 LAB — POCT GLYCOSYLATED HEMOGLOBIN (HGB A1C)
Est. average glucose Bld gHb Est-mCnc: 171
Hemoglobin A1C: 7.6

## 2015-10-18 NOTE — Progress Notes (Signed)
Patient: Briana Austin, Female    DOB: [birth year], 81 y.o.   MRN: [id number] Visit Date: 10/18/2015  Today's Provider: Lelon Huh, MD   Chief Complaint  Patient presents with  . Annual Exam  . Diabetes  . Hypertension  . Dementia   Subjective:    Annual physical  Briana Austin is a 81 y.o. female. She feels well. She reports exercising none. She reports she is sleeping well.  -----------------------------------------------------------  Follow-up for Dementia in Alzheimer's disease from 08/13/2015; no changes were made, stable on current medication.    Diabetes Mellitus Type II, Follow-up:   Lab Results  Component Value Date   HGBA1C 6.7* 07/04/2015   HGBA1C 6.4 04/17/2015   HGBA1C 6.5 01/10/2015   Last seen for diabetes 2 months ago.  Management since then includes; no changes were made. She reports good compliance with treatment. She is not having side effects. none Current symptoms include none and have been stable. Home blood sugar records: fasting range: 111  Episodes of hypoglycemia? no   Current Insulin Regimen: n/a Most Recent Eye Exam: 2016 Weight trend: increasing steadily Prior visit with dietician: no Current diet: in general, a "healthy" diet   Current exercise: none  ----------------------------------------------------------------------   Hypertension, follow-up:  BP Readings from Last 3 Encounters:  10/18/15 124/70  08/13/15 120/60  07/25/15 115/67    She was last seen for hypertension 2 months ago.  BP at that visit was 120/60. Management since that visit includes; no changes were made.She reports good compliance with treatment. She is not having side effects. none  She is not exercising. She is adherent to low salt diet.   Outside blood pressures are normal. She is experiencing none.  Patient denies none.   Cardiovascular risk factors include none.  Use of agents associated with hypertension: none.    ----------------------------------------------------------------------        Review of Systems  Constitutional: Positive for fatigue.  HENT: Positive for congestion.        Hacking  Eyes: Positive for discharge.  Respiratory: Positive for cough.   Cardiovascular: Negative.   Gastrointestinal: Negative.   Endocrine: Negative.   Genitourinary: Negative.   Musculoskeletal: Positive for myalgias.       When walking for a while  Skin: Negative.   Allergic/Immunologic: Negative.   Neurological: Positive for seizures and weakness.  Hematological: Bruises/bleeds easily.  Psychiatric/Behavioral: Negative.        Dementia    Social History   Social History  . Marital Status: Married    Spouse Name: N/A  . Number of Children: 1  . Years of Education: HS Grad   Occupational History  . Retired     [employer]   Social History Main Topics  . Smoking status: Former Smoker -- 0.50 packs/day for 8 years    Types: Cigarettes  . Smokeless tobacco: Never Used  . Alcohol Use: No  . Drug Use: No  . Sexual Activity: No   Other Topics Concern  . Not on file   Social History Narrative   Lives at home with her husband.   Right-handed.   1 cup caffeine daily.    Patient Active Problem List   Diagnosis Date Noted  . Seizures ([place])   . Euthyroid sick syndrome   . 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 Dementia in Alzheimer's disease   . Essential hypertension   . Facial droop 07/03/2015  . TIA (transient ischemic attack)   .  Diabetes mellitus, type II ([place]) 03/25/2015  . Hyperlipidemia 03/25/2015  . Normocytic anemia 03/25/2015  . Physical deconditioning 03/25/2015  . Vitamin D deficiency 11/17/2010    Past Surgical History  Procedure Laterality Date  . Sigmoid polyp excision  11/24/2004    Hyperplastic polyp  . Breast lumpectomy Right 2005    Carcinoma in situ of right breast  . Total abdominal hysterectomy w/ bilateral salpingoophorectomy  1981    Benign     Her family history includes Dementia in her brother; Stroke in her mother.    Previous Medications   ACIDOPHILUS (RISAQUAD) CAPS CAPSULE    Take 1 capsule by mouth 2 (two) times daily.   ASPIRIN EC 81 MG TABLET    Take 81 mg by mouth daily.   CALCIUM CARBONATE (OS-CAL) 600 MG TABS TABLET    Take 1 tablet by mouth daily.   DONEPEZIL (ARICEPT) 5 MG TABLET    Take 1 tablet by mouth at bedtime.    FEEDING SUPPLEMENT, ENSURE ENLIVE, (ENSURE ENLIVE) LIQD    Take 237 mLs by mouth 2 (two) times daily between meals.   GLIPIZIDE (GLUCOTROL XL) 5 MG 24 HR TABLET    Take 5 mg by mouth daily with breakfast.   GLUCOSE BLOOD (ONETOUCH VERIO) TEST STRIP    1 each by Other route as needed for other. Use as instructed   LEVETIRACETAM (KEPPRA) 500 MG TABLET    Take 1 tablet (500 mg total) by mouth 2 (two) times daily.   LISINOPRIL (PRINIVIL,ZESTRIL) 40 MG TABLET    Take 0.5 tablets (20 mg total) by mouth at bedtime.   MEMANTINE (NAMENDA XR) 28 MG CP24 24 HR CAPSULE    Take 1 capsule by mouth at bedtime.    METFORMIN (GLUMETZA) 500 MG (MOD) 24 HR TABLET    Take 500 mg by mouth daily with breakfast.   METOPROLOL-HYDROCHLOROTHIAZIDE (LOPRESSOR HCT) 100-50 MG PER TABLET    TAKE ONE TABLET BY MOUTH ONCE DAILY   ONE TOUCH LANCETS MISC    1 Device by Does not apply route daily.   POTASSIUM CHLORIDE (K-DUR,KLOR-CON) 10 MEQ TABLET    Take 1 tablet by mouth at bedtime.    PRAVASTATIN (PRAVACHOL) 40 MG TABLET    Take 1 tablet (40 mg total) by mouth daily.   VITAMIN B-12 1000 MCG TABLET    Take 1 tablet (1,000 mcg total) by mouth daily.   VITAMIN D, ERGOCALCIFEROL, (DRISDOL) 50000 UNITS CAPS CAPSULE    Take 1 capsule by mouth once a week. Take on Tuesday.    Patient Care Team: Birdie Sons, MD as PCP - General (Family Medicine)     Objective:   Vitals: BP 124/70 mmHg  Pulse 69  Temp(Src) 98.7 F (37.1 C) (Oral)  Resp 16  Ht 5' 7.25" (1.708 m)  Wt 180 lb (81.647 kg)  BMI 27.99 kg/m2  SpO2  97%  Physical Exam  General Appearance:    Alert, cooperative, no distress, appears stated age  Head:    Normocephalic, without obvious abnormality, atraumatic  Eyes:    PERRL, conjunctiva/corneas clear, EOM's intact, fundi    benign, both eyes  Ears:    Normal TM's and external ear canals, both ears  Nose:   Nares normal, septum midline, mucosa normal, no drainage    or sinus tenderness  Throat:   Lips, mucosa, and tongue normal; teeth and gums normal  Neck:   Supple, symmetrical, trachea midline, no adenopathy;    thyroid:  no  enlargement/tenderness/nodules; no carotid   bruit or JVD  Back:     Symmetric, no curvature, ROM normal, no CVA tenderness  Lungs:     Clear to auscultation bilaterally, respirations unlabored  Chest Wall:    No tenderness or deformity   Heart:    Regular rate and rhythm, S1 and S2 normal, no murmur, rub   or gallop  Breast Exam:    deferred  Abdomen:     Soft, non-tender  Pelvic:    deferred  Extremities:   Extremities normal, atraumatic, no cyanosis or edema  Pulses:   2+ and symmetric all extremities  Skin:   Skin color, texture, turgor normal, no rashes or lesions  Lymph nodes:   Cervical, supraclavicular, and axillary nodes normal  Neurologic:   CNII-XII intact, normal strength, sensation and reflexes    Throughout. Oriented to person only.      Activities of Daily Living In your present state of health, do you have any difficulty performing the following activities: 10/18/2015 07/05/2015  Hearing? N N  Vision? N N  Difficulty concentrating or making decisions? Tempie Donning  Walking or climbing stairs? Y Y  Dressing or bathing? Y Y  Doing errands, shopping? Y Y    Fall Risk Assessment Fall Risk  10/18/2015  Falls in the past year? No     Depression Screen PHQ 2/9 Scores 10/18/2015 08/13/2015  PHQ - 2 Score 3 1  PHQ- 9 Score 16 8    Cognitive Testing - 6-CIT Unable to  Not applicable  Alcohol Use Screening  Question Answer Points  How  often do you have alcoholic drink? never 0  On days you do drink alcohol, how many drinks do you typically consume? n/a 0  How oftey will you drink 6 or more in a total? never 0  Total Score:  0   A score of 3 or more in women, and 4 or more in men indicates increased risk for alcohol abuse, EXCEPT if all of the points are from question 1.     Assessment & Plan:    Annual physical Reviewed patient's Family Medical History Reviewed and updated list of patient's medical providers Assessment of cognitive impairment was done Assessed patient's functional ability Established a written schedule for health screening [place] Completed and Reviewed  Exercise Activities and Dietary recommendations Goals    None      Immunization History  Administered Date(s) Administered  . Influenza, High Dose Seasonal PF 07/26/2014, 08/13/2015  . Pneumococcal Conjugate-13 04/11/2014  . Pneumococcal Polysaccharide-23 09/21/2002        Discussed health benefits of physical activity, and encouraged her to engage in regular exercise appropriate for her age and condition.    Results for orders placed or performed in visit on 08/13/15  POCT glycosylated hemoglobin (Hb A1C)  Result Value Ref Range   Hemoglobin A1C 7.6    Est. average glucose Bld gHb Est-mCnc 171     -------------------------------------------------------------------------------- 1. Annual physical exam Given contact information to schedule mammogram  2. Type 2 diabetes mellitus without complication, without long-term current use of insulin (HCC) Stable. Doing well current dose of metformin - POCT glycosylated hemoglobin (Hb A1C)  3. Estrogen deficiency  - DG Bone Density; Future  4. Essential hypertension Well controlled.  Continue current medications.    5. Dementia in Alzheimer's disease Fairly advanced, still living at home with her husband. Continue current medications.

## 2015-10-29 ENCOUNTER — Ambulatory Visit (INDEPENDENT_AMBULATORY_CARE_PROVIDER_SITE_OTHER): Payer: Commercial Managed Care - HMO | Admitting: Nurse Practitioner

## 2015-10-29 ENCOUNTER — Encounter: Payer: Self-pay | Admitting: Nurse Practitioner

## 2015-10-29 VITALS — BP 118/74 | HR 77 | Ht 68.0 in | Wt 182.2 lb

## 2015-10-29 DIAGNOSIS — F0280 Dementia in other diseases classified elsewhere without behavioral disturbance: Secondary | ICD-10-CM

## 2015-10-29 DIAGNOSIS — R569 Unspecified convulsions: Secondary | ICD-10-CM | POA: Diagnosis not present

## 2015-10-29 DIAGNOSIS — G309 Alzheimer's disease, unspecified: Secondary | ICD-10-CM

## 2015-10-29 DIAGNOSIS — G459 Transient cerebral ischemic attack, unspecified: Secondary | ICD-10-CM

## 2015-10-29 NOTE — Progress Notes (Signed)
I have reviewed and agreed above plan. 

## 2015-10-29 NOTE — Progress Notes (Signed)
[facility]  PATIENT: Briana Austin DOB: [birth year]   REASON FOR VISIT: Follow-up for seizure disorder, Alzheimer's dementia HISTORY FROM: Husband, patient and daughter    HISTORY OF PRESENT ILLNESS: Briana Austin, 81 year old female returns for follow-up. She was evaluated for seizure disorder  07/25/2015. She is currently on Keppra 500 twice daily without recurrent seizure activity. She is also on Aricept and Namenda for Alzheimer's dementia. She no longer cooks. She needs minimal assistance with dressing and bathing. She can feed herself. No safety issues have been identified. No recent falls, she ambulates in the home with a walker. She returns for reevaluation   HISTORY: Briana Austin is a 81 yo RH female, seen in refer by Dr. Birdie Sons for evaluation of seizure. She has past medical history of dementia, diabetes, hypertension, hyperlipidemia, had one seizure August 30 first 2016, was weakness by her husband, she had sudden onset right hand posturing, forceful head turning to the left side, body jerking, she was taken by ambulance to the emergency room, in August 30 first 7 AM, she had another recurrent episodes, similar presentation at [facility] She was loaded with Keppra 500 mg twice a day, family noticed increased drowsiness, no recurrent seizure. At baseline, she is forgetful, needing help bathing, still able to carry on a conversation, she retired from Psychologist, educational job, ambulate with a walker. There was no agitation, mild gait difficulty, no bowel bladder incontinence. I have reviewed MRI of the brain without contrast July 03 2015:No acute intracranial abnormality. Remote lacunar infarct of the right cerebellum. Normal variant MRA circle of Willis without significant proximal stenosis, aneurysm, or branch vessel occlusion. EEG September second 2016: Mild diffuse slowing of the background. Focal slowing over the left temporal region  REVIEW OF SYSTEMS:  Full 14 system review of systems performed and notable only for those listed, all others are neg:  Constitutional: Fatigue Cardiovascular: neg Ear/Nose/Throat: neg  Skin: neg Eyes: neg Respiratory: neg Gastroitestinal: neg  Hematology/Lymphatic: neg  Endocrine: neg Musculoskeletal:neg Allergy/Immunology: neg Neurological: Memory loss, history of seizure disorder Psychiatric: neg Sleep : Daytime sleepiness ALLERGIES: No Known Allergies  HOME MEDICATIONS: Outpatient Prescriptions Prior to Visit  Medication Sig Dispense Refill  . aspirin EC 81 MG tablet Take 81 mg by mouth daily.    . calcium carbonate (OS-CAL) 600 MG TABS tablet Take 1 tablet by mouth daily.    Marland Kitchen donepezil (ARICEPT) 5 MG tablet Take 1 tablet by mouth at bedtime.     . feeding supplement, ENSURE ENLIVE, (ENSURE ENLIVE) LIQD Take 237 mLs by mouth 2 (two) times daily between meals. (Patient taking differently: Take 237 mLs by mouth as needed. ) 237 mL 12  . glipiZIDE (GLUCOTROL XL) 5 MG 24 hr tablet Take 5 mg by mouth daily with breakfast.    . glucose blood (ONETOUCH VERIO) test strip 1 each by Other route as needed for other. Use as instructed 100 each 4  . levETIRAcetam (KEPPRA) 500 MG tablet Take 1 tablet (500 mg total) by mouth 2 (two) times daily. 60 tablet 11  . lisinopril (PRINIVIL,ZESTRIL) 40 MG tablet Take 0.5 tablets (20 mg total) by mouth at bedtime. 1 tablet 0  . memantine (NAMENDA XR) 28 MG CP24 24 hr capsule Take 1 capsule by mouth at bedtime.     . metFORMIN (GLUMETZA) 500 MG (MOD) 24 hr tablet Take 500 mg by mouth daily with breakfast.    . metoprolol-hydrochlorothiazide (LOPRESSOR HCT) 100-50 MG per tablet TAKE ONE TABLET BY  MOUTH ONCE DAILY (Patient taking differently: TAKE ONE-HALF TABLET BY MOUTH ONCE DAILY AT BEDTIME) 30 tablet 12  . ONE TOUCH LANCETS MISC 1 Device by Does not apply route daily. 100 each 4  . potassium chloride (K-DUR,KLOR-CON) 10 MEQ tablet Take 1 tablet by mouth at bedtime.     .  pravastatin (PRAVACHOL) 40 MG tablet Take 1 tablet (40 mg total) by mouth daily. (Patient taking differently: Take 40 mg by mouth at bedtime. ) 30 tablet 12  . vitamin B-12 1000 MCG tablet Take 1 tablet (1,000 mcg total) by mouth daily. 30 tablet 1  . Vitamin D, Ergocalciferol, (DRISDOL) 50000 UNITS CAPS capsule Take 1 capsule by mouth once a week. Take on Tuesday.    Marland Kitchen acidophilus (RISAQUAD) CAPS capsule Take 1 capsule by mouth 2 (two) times daily. (Patient not taking: Reported on 10/29/2015) 60 capsule 0   No facility-administered medications prior to visit.    PAST MEDICAL HISTORY: Past Medical History  Diagnosis Date  . History of breast cancer   . Dementia   . Diabetes mellitus without complication ([place])   . Hypertension   . Cancer ([place])   . Seizure ([place])   . CAP (community acquired pneumonia) 06/14/2015    PAST SURGICAL HISTORY: Past Surgical History  Procedure Laterality Date  . Sigmoid polyp excision  11/24/2004    Hyperplastic polyp  . Breast lumpectomy Right 2005    Carcinoma in situ of right breast  . Total abdominal hysterectomy w/ bilateral salpingoophorectomy  1981    Benign    FAMILY HISTORY: Family History  Problem Relation Age of Onset  . Stroke Mother   . Dementia Brother     SOCIAL HISTORY: Social History   Social History  . Marital Status: Married    Spouse Name: N/A  . Number of Children: 1  . Years of Education: HS Grad   Occupational History  . Retired     [employer]   Social History Main Topics  . Smoking status: Former Smoker -- 0.50 packs/day for 8 years    Types: Cigarettes  . Smokeless tobacco: Never Used  . Alcohol Use: No  . Drug Use: No  . Sexual Activity: No   Other Topics Concern  . Not on file   Social History Narrative   Lives at home with her husband.   Right-handed.   1 cup caffeine daily.     PHYSICAL EXAM  Filed Vitals:   10/29/15 1007  BP: 118/74  Pulse: 77  Height: 5\' 8"  (1.727 m)  Weight: 182  lb 3.2 oz (82.645 kg)   Body mass index is 27.71 kg/(m^2).  Gen: NAD, conversant, well nourised, obese, well groomed  Cardiovascular: Regular rate rhythm,  Neck: Supple, no carotid bruit. Pulmonary: Clear to auscultation bilaterally   NEUROLOGICAL EXAM:  MENTAL STATUS: Speech:  Speech is normal; fluent and spontaneous with normal comprehension.  Cognition:Mini-Mental Status Examination is 17/30. Missed items in orientation calculation, 3 of 3 recall, unable to copy a figure.AFT 6. Clock drawing 2/4.  CRANIAL NERVES: CN II: Visual fields are full to confrontation.  Pupils are round equal and briskly reactive to light. CN III, IV, VI: extraocular movement are normal. No ptosis. CN V: Facial sensation is intact to pinprick in all 3 divisions bilaterally. .  CN VII: Face is symmetric with normal eye closure and smile. CN VIII: Hearing is normal to rubbing fingers CN IX, X: Palate elevates symmetrically. Phonation is normal. CN XI: Head turning and  shoulder shrug are intact CN XII: Tongue is midline with normal movements and no atrophy. MOTOR: There is no pronator drift of out-stretched arms. Muscle bulk and tone are normal. Muscle strength is normal. REFLEXES: Reflexes are 2+ and symmetric at the biceps, triceps, knees, and ankles. Plantar responses are flexor. COORDINATION: Rapid alternating movements and fine finger movements are intact. There is no dysmetria on finger-to-nose and heel-knee-shin.  GAIT/STANCE: Not ambulated in wheelchair   DIAGNOSTIC DATA (LABS, IMAGING, TESTING) - I reviewed patient records, labs, notes, testing and imaging myself where available.  Lab Results  Component Value Date   WBC 5.4 07/12/2015   HGB 8.9* 07/05/2015   HCT 32.3* 07/12/2015   MCV 93.6 07/05/2015   PLT 158 07/05/2015      Component Value Date/Time   NA 133* 07/12/2015 0938   NA 132* 07/05/2015 0432   NA 139 09/14/2014   K 4.3 07/12/2015 0938   K 4.4  09/14/2014   K 3.9 01/25/2014 1040   CL 94* 07/12/2015 0938   CL 100 01/25/2014 1040   CO2 22 07/12/2015 0938   CO2 24 01/25/2014 1040   GLUCOSE 93 07/12/2015 0938   GLUCOSE 114* 07/05/2015 0432   GLUCOSE 229* 01/25/2014 1040   BUN 14 07/12/2015 0938   BUN 6 07/05/2015 0432   BUN 20 09/14/2014   CREATININE 1.21* 07/12/2015 0938   CREATININE 1.2* 03/14/2015   CREATININE 1.33 09/14/2014   CREATININE 1.64* 01/25/2014 1040   CALCIUM 10.4* 07/12/2015 0938   CALCIUM 10.0 01/25/2014 1040   PROT 6.7 07/04/2015 0630   PROT 7.8 06/07/2015 1042   PROT 8.3* 01/25/2014 1040   ALBUMIN 3.0* 07/05/2015 0432   ALBUMIN 3.9 06/07/2015 1042   ALBUMIN 3.6 01/25/2014 1040   AST 20 07/04/2015 0630   AST 22 01/25/2014 1040   ALT 10* 07/04/2015 0630   ALT 26 01/25/2014 1040   ALT 20 04/12/2013   ALKPHOS 48 07/04/2015 0630   ALKPHOS 60 01/25/2014 1040   BILITOT 0.7 07/04/2015 0630   BILITOT 0.5 06/07/2015 1042   BILITOT 0.3 01/25/2014 1040   GFRNONAA 43* 07/12/2015 0938   GFRNONAA 30* 01/25/2014 1040   GFRNONAA 37* 01/19/2012 0939   GFRAA 49* 07/12/2015 0938   GFRAA 34* 01/25/2014 1040   GFRAA 45* 01/19/2012 0939   Lab Results  Component Value Date   CHOL 116 07/04/2015   HDL 38* 07/04/2015   LDLCALC 66 07/04/2015   TRIG 59 07/04/2015   CHOLHDL 3.1 07/04/2015   Lab Results  Component Value Date   HGBA1C 7.6 10/18/2015   Lab Results  Component Value Date   VITAMINB12 240 07/04/2015   Lab Results  Component Value Date   TSH 0.596 07/12/2015      ASSESSMENT AND PLAN  81 y.o. year old female  has a past medical history of  Dementia; Diabetes mellitus without complication ([place]); Hypertension;  Seizure [facility]);  here to follow-up for seizure disorder which is stable.  Continue keppra at current dose  Does not need refills F/U in 6 months if stable then yearly Dennie Bible, [facility], [facility], [facility] [address], [place], [place]  [address] [phone]

## 2015-10-29 NOTE — Patient Instructions (Signed)
Continue keppra at current dose  Does not need refills F/U in 6 months

## 2015-10-30 ENCOUNTER — Telehealth: Payer: Self-pay | Admitting: Family Medicine

## 2015-10-31 ENCOUNTER — Ambulatory Visit
Admission: RE | Admit: 2015-10-31 | Discharge: 2015-10-31 | Disposition: A | Discharge status: Home / Self Care | Payer: Commercial Managed Care - HMO | Source: Ambulatory Visit | Attending: Family Medicine | Admitting: Family Medicine

## 2015-10-31 DIAGNOSIS — Z1231 Encounter for screening mammogram for malignant neoplasm of breast: Secondary | ICD-10-CM | POA: Diagnosis not present

## 2015-10-31 DIAGNOSIS — E2839 Other primary ovarian failure: Secondary | ICD-10-CM | POA: Insufficient documentation

## 2015-10-31 DIAGNOSIS — M810 Age-related osteoporosis without current pathological fracture: Secondary | ICD-10-CM | POA: Diagnosis not present

## 2015-10-31 HISTORY — DX: Malignant neoplasm of unspecified site of unspecified female breast: C50.919

## 2015-11-07 NOTE — Telephone Encounter (Signed)
Miscellaneous entry

## 2015-11-13 ENCOUNTER — Ambulatory Visit (INDEPENDENT_AMBULATORY_CARE_PROVIDER_SITE_OTHER): Payer: Commercial Managed Care - HMO | Admitting: Family Medicine

## 2015-11-13 ENCOUNTER — Encounter: Payer: Self-pay | Admitting: Family Medicine

## 2015-11-13 VITALS — BP 114/70 | HR 71 | Temp 98.7°F | Resp 16 | Ht 67.25 in | Wt 183.0 lb

## 2015-11-13 DIAGNOSIS — I10 Essential (primary) hypertension: Secondary | ICD-10-CM | POA: Diagnosis not present

## 2015-11-13 DIAGNOSIS — F0280 Dementia in other diseases classified elsewhere without behavioral disturbance: Secondary | ICD-10-CM

## 2015-11-13 DIAGNOSIS — G309 Alzheimer's disease, unspecified: Secondary | ICD-10-CM

## 2015-11-13 DIAGNOSIS — E119 Type 2 diabetes mellitus without complications: Secondary | ICD-10-CM | POA: Diagnosis not present

## 2015-11-13 NOTE — Progress Notes (Signed)
Patient: Briana Austin Female    DOB: October 01, 1934   82 y.o.   MRN: [id number] Visit Date: 11/13/2015  Today's Provider: Lelon Huh, MD   Chief Complaint  Patient presents with  . Follow-up  . Diabetes  . Hypertension  . Dementia   Subjective:    HPI  Follow-up for Dementia in Alzheimer's disease from 10/18/2015; no changers.    Diabetes Mellitus Type II, Follow-up:   Lab Results  Component Value Date   HGBA1C 7.6 10/18/2015   HGBA1C 6.7* 07/04/2015   HGBA1C 6.4 04/17/2015   Last seen for diabetes 1 months ago.  Management since then includes; no changes. She reports good compliance with treatment. She is not having side effects. none Current symptoms include none and have been stable. Home blood sugar records: fasting range: 100  Episodes of hypoglycemia? no   Current Insulin Regimen: n/a Weight trend: increasing Prior visit with dietician: no Current diet: well balanced Current exercise: none  ----------------------------------------------------------------------   Hypertension, follow-up:  BP Readings from Last 3 Encounters:  11/13/15 114/70  10/29/15 118/74  10/18/15 124/70    She was last seen for hypertension 1 months ago.  BP at that visit was 124/70. Management since that visit includes; no changes.She reports good compliance with treatment. She is not having side effects. none  She is not exercising. She is adherent to low salt diet.   Outside blood pressures are n/a. She is experiencing none.  Patient denies none.   Cardiovascular risk factors include diabetes mellitus.  Use of agents associated with hypertension: none.   ----------------------------------------------------------------------    No Known Allergies Previous Medications   ASPIRIN EC 81 MG TABLET    Take 81 mg by mouth daily.   CALCIUM CARBONATE (OS-CAL) 600 MG TABS TABLET    Take 1 tablet by mouth daily.   DONEPEZIL (ARICEPT) 5 MG TABLET    Take 1 tablet by  mouth at bedtime.    FEEDING SUPPLEMENT, ENSURE ENLIVE, (ENSURE ENLIVE) LIQD    Take 237 mLs by mouth 2 (two) times daily between meals.   GLIPIZIDE (GLUCOTROL XL) 5 MG 24 HR TABLET    Take 5 mg by mouth daily with breakfast.   GLUCOSE BLOOD (ONETOUCH VERIO) TEST STRIP    1 each by Other route as needed for other. Use as instructed   LEVETIRACETAM (KEPPRA) 500 MG TABLET    Take 1 tablet (500 mg total) by mouth 2 (two) times daily.   LISINOPRIL (PRINIVIL,ZESTRIL) 40 MG TABLET    Take 0.5 tablets (20 mg total) by mouth at bedtime.   MEMANTINE (NAMENDA XR) 28 MG CP24 24 HR CAPSULE    Take 1 capsule by mouth at bedtime.    METFORMIN (GLUMETZA) 500 MG (MOD) 24 HR TABLET    Take 500 mg by mouth daily with breakfast.   METOPROLOL-HYDROCHLOROTHIAZIDE (LOPRESSOR HCT) 100-50 MG PER TABLET    TAKE ONE TABLET BY MOUTH ONCE DAILY   ONE TOUCH LANCETS MISC    1 Device by Does not apply route daily.   POTASSIUM CHLORIDE (K-DUR,KLOR-CON) 10 MEQ TABLET    Take 1 tablet by mouth at bedtime.    PRAVASTATIN (PRAVACHOL) 40 MG TABLET    Take 1 tablet (40 mg total) by mouth daily.   VITAMIN B-12 1000 MCG TABLET    Take 1 tablet (1,000 mcg total) by mouth daily.   VITAMIN D, ERGOCALCIFEROL, (DRISDOL) 50000 UNITS CAPS CAPSULE    Take 1 capsule by  mouth once a week. Take on Tuesday.    Review of Systems  Constitutional: Negative for fever, chills, appetite change and fatigue.  Respiratory: Negative for chest tightness and shortness of breath.   Cardiovascular: Negative for chest pain and palpitations.  Gastrointestinal: Negative for nausea, vomiting and abdominal pain.  Neurological: Negative for dizziness and weakness.    Social History  Substance Use Topics  . Smoking status: Former Smoker -- 0.50 packs/day for 8 years    Types: Cigarettes  . Smokeless tobacco: Never Used  . Alcohol Use: No   Objective:   BP 114/70 mmHg  Pulse 71  Temp(Src) 98.7 F (37.1 C) (Oral)  Resp 16  Ht 5' 7.25" (1.708 m)  Wt 183  lb (83.008 kg)  BMI 28.45 kg/m2  SpO2 94%  Physical Exam  General appearance: alert, well developed, well nourished, cooperative and in no distress Head: Normocephalic, without obvious abnormality, atraumatic Lungs: Respirations even and unlabored Extremities: No gross deformities Skin: Skin color, texture, turgor normal. No rashes seen  Psych: Appropriate mood and affect. Neurologic: Mental status: Alert, oriented to person only     Assessment & Plan:     1. Type 2 diabetes mellitus without complication, without long-term current use of insulin (HCC) Well controlled.  Continue current medications.   - glipiZIDE (GLUCOTROL XL) 5 MG 24 hr tablet; Take 1 tablet (5 mg total) by mouth daily with breakfast.  Dispense: 30 tablet; Refill: 12  2. Essential hypertension Well controlled.  Continue current medications.    3. Dementia in Alzheimer's disease Continue current medications.  - donepezil (ARICEPT) 5 MG tablet; Take 1 tablet (5 mg total) by mouth at bedtime.  Dispense: 30 tablet; Refill: 12       Lelon Huh, MD  [facility]

## 2015-11-19 MED ORDER — DONEPEZIL HCL 5 MG PO TABS: 5.0000 mg | ORAL_TABLET | Freq: Every day | ORAL | Status: DC

## 2015-11-19 MED ORDER — GLIPIZIDE ER 5 MG PO TB24: 5.0000 mg | ORAL_TABLET | Freq: Every day | ORAL | Status: DC

## 2015-11-22 ENCOUNTER — Other Ambulatory Visit: Payer: Self-pay | Admitting: Family Medicine

## 2015-11-29 ENCOUNTER — Other Ambulatory Visit: Payer: Self-pay | Admitting: Family Medicine

## 2016-01-17 ENCOUNTER — Ambulatory Visit: Payer: Commercial Managed Care - HMO | Admitting: Family Medicine

## 2016-01-20 ENCOUNTER — Encounter: Payer: Self-pay | Admitting: Family Medicine

## 2016-01-20 ENCOUNTER — Ambulatory Visit (INDEPENDENT_AMBULATORY_CARE_PROVIDER_SITE_OTHER): Payer: Commercial Managed Care - HMO | Admitting: Family Medicine

## 2016-01-20 VITALS — BP 124/58 | HR 74 | Temp 99.2°F | Resp 16 | Ht 67.25 in

## 2016-01-20 DIAGNOSIS — R059 Cough, unspecified: Secondary | ICD-10-CM

## 2016-01-20 DIAGNOSIS — R05 Cough: Secondary | ICD-10-CM | POA: Diagnosis not present

## 2016-01-20 DIAGNOSIS — J111 Influenza due to unidentified influenza virus with other respiratory manifestations: Secondary | ICD-10-CM

## 2016-01-20 MED ORDER — AZITHROMYCIN 250 MG PO TABS: ORAL_TABLET | ORAL | Status: AC

## 2016-01-20 MED ORDER — OSELTAMIVIR PHOSPHATE 75 MG PO CAPS: 75.0000 mg | ORAL_CAPSULE | Freq: Two times a day (BID) | ORAL | Status: AC

## 2016-01-20 NOTE — Progress Notes (Signed)
Patient: Briana Austin Female    DOB: [birth year]   82 y.o.   MRN: [id number] Visit Date: 01/20/2016  Today's Provider: Lelon Huh, MD   Chief Complaint  Patient presents with  . Cough   Subjective:    Cough This is a new problem. Episode onset: 3 days. The problem has been gradually worsening. The cough is productive of sputum. Associated symptoms include headaches and nasal congestion. Pertinent negatives include no chest pain, chills, ear congestion, ear pain, fever, myalgias, postnasal drip, shortness of breath, sweats or wheezing. The symptoms are aggravated by lying down (cough is worse at night). She has tried nothing for the symptoms. Her past medical history is significant for pneumonia.   Cough for 3 days. Patient has only complained  About headache, no other symptoms. Cough has gradually worsened and seems worse at night. Cough is productive. Patient's husband had similar symptoms over the weekend and was seen this morning testing positive for influenza B.    No Known Allergies Previous Medications   ASPIRIN EC 81 MG TABLET    Take 81 mg by mouth daily.   CALCIUM CARBONATE (OS-CAL) 600 MG TABS TABLET    Take 1 tablet by mouth daily.   DONEPEZIL (ARICEPT) 5 MG TABLET    Take 1 tablet (5 mg total) by mouth at bedtime.   FEEDING SUPPLEMENT, ENSURE ENLIVE, (ENSURE ENLIVE) LIQD    Take 237 mLs by mouth 2 (two) times daily between meals.   GLIPIZIDE (GLUCOTROL XL) 5 MG 24 HR TABLET    Take 1 tablet (5 mg total) by mouth daily with breakfast.   GLUCOSE BLOOD (ONETOUCH VERIO) TEST STRIP    1 each by Other route as needed for other. Use as instructed   KLOR-CON M10 10 MEQ TABLET    TAKE ONE TABLET BY MOUTH ONCE DAILY   LEVETIRACETAM (KEPPRA) 500 MG TABLET    Take 1 tablet (500 mg total) by mouth 2 (two) times daily.   LISINOPRIL (PRINIVIL,ZESTRIL) 40 MG TABLET    Take 0.5 tablets (20 mg total) by mouth at bedtime.   MEMANTINE (NAMENDA XR) 28 MG CP24 24 HR CAPSULE    Take 1  capsule by mouth at bedtime.    METFORMIN (GLUMETZA) 500 MG (MOD) 24 HR TABLET    Take 500 mg by mouth daily with breakfast.   METOPROLOL-HYDROCHLOROTHIAZIDE (LOPRESSOR HCT) 100-50 MG PER TABLET    TAKE ONE TABLET BY MOUTH ONCE DAILY   ONE TOUCH LANCETS MISC    1 Device by Does not apply route daily.   PRAVASTATIN (PRAVACHOL) 40 MG TABLET    Take 1 tablet (40 mg total) by mouth daily.   VITAMIN B-12 1000 MCG TABLET    Take 1 tablet (1,000 mcg total) by mouth daily.   VITAMIN D, ERGOCALCIFEROL, (DRISDOL) 50000 UNITS CAPS CAPSULE    TAKE ONE CAPSULE BY MOUTH ONCE A WEEK    Review of Systems  Constitutional: Negative for fever, chills, appetite change and fatigue.  HENT: Negative for ear pain and postnasal drip.   Respiratory: Positive for cough. Negative for chest tightness, shortness of breath and wheezing.   Cardiovascular: Negative for chest pain and palpitations.  Gastrointestinal: Negative for nausea, vomiting and abdominal pain.  Musculoskeletal: Negative for myalgias.  Neurological: Positive for headaches. Negative for dizziness and weakness.    Social History  Substance Use Topics  . Smoking status: Former Smoker -- 0.50 packs/day for 8 years    Types:  Cigarettes  . Smokeless tobacco: Never Used  . Alcohol Use: No   Objective:   BP 124/58 mmHg  Pulse 74  Temp(Src) 99.2 F (37.3 C) (Oral)  Resp 16  Ht 5' 7.25" (1.708 m)  Physical Exam  General Appearance:    Alert, cooperative, no distress  HENT:   bilateral TM normal without fluid or infection, neck without nodes, throat normal without erythema or exudate and sinuses nontender  Eyes:    PERRL, conjunctiva/corneas clear, EOM's intact       Lungs:     Occasional expiratory wheezing, no rales, respirations unlabored  Heart:    Regular rate and rhythm  Neurologic:   Awake, alert, oriented x 3. No apparent focal neurological           defect.           Assessment & Plan:     1. Influenza Considering symptoms and  known household exposure to influenza B, she likely has flue and will treat accordingly - oseltamivir (TAMIFLU) 75 MG capsule; Take 1 capsule (75 mg total) by mouth 2 (two) times daily.  Dispense: 10 capsule; Refill: 0  2. Cough She does have some wheezing and will cover for suspected secondary bronchitis.  - azithromycin (ZITHROMAX) 250 MG tablet; 2 by mouth today, then 1 daily for 4 days  Dispense: 6 tablet; Refill: 0       Lelon Huh, MD  [facility]

## 2016-01-22 ENCOUNTER — Ambulatory Visit: Payer: Commercial Managed Care - HMO | Admitting: Family Medicine

## 2016-01-27 ENCOUNTER — Encounter: Payer: Self-pay | Admitting: Family Medicine

## 2016-01-27 ENCOUNTER — Ambulatory Visit (INDEPENDENT_AMBULATORY_CARE_PROVIDER_SITE_OTHER): Payer: Commercial Managed Care - HMO | Admitting: Family Medicine

## 2016-01-27 ENCOUNTER — Other Ambulatory Visit: Payer: Self-pay | Admitting: Family Medicine

## 2016-01-27 VITALS — BP 120/60 | HR 97 | Temp 98.7°F | Resp 16 | Ht 67.25 in | Wt 184.0 lb

## 2016-01-27 DIAGNOSIS — J40 Bronchitis, not specified as acute or chronic: Secondary | ICD-10-CM | POA: Diagnosis not present

## 2016-01-27 MED ORDER — LEVOFLOXACIN 500 MG PO TABS: 500.0000 mg | ORAL_TABLET | Freq: Every day | ORAL | Status: AC

## 2016-01-27 NOTE — Progress Notes (Signed)
Patient: Briana Austin Female    DOB: Aug 02, 1934   82 y.o.   MRN: [id number] Visit Date: 01/27/2016  Today's Provider: Lelon Huh, MD   Chief Complaint  Patient presents with  . Follow-up  . Cough   Subjective:    Cough This is a new problem. The current episode started in the past 7 days. The problem has been unchanged. The problem occurs every few hours. The cough is non-productive. Associated symptoms include shortness of breath. Pertinent negatives include no chest pain, chills, fever or wheezing. Treatments tried: tamiflu and z-pack. The treatment provided moderate relief.    Patient was seen 01/20/2016 for influenza and cough.Prescribed tamiflu and z-pack. She still has deep cough. She gets tried easily and out-of-breath while going short distances.    No Known Allergies Previous Medications   ASPIRIN EC 81 MG TABLET    Take 81 mg by mouth daily.   CALCIUM CARBONATE (OS-CAL) 600 MG TABS TABLET    Take 1 tablet by mouth daily.   DONEPEZIL (ARICEPT) 5 MG TABLET    Take 1 tablet (5 mg total) by mouth at bedtime.   FEEDING SUPPLEMENT, ENSURE ENLIVE, (ENSURE ENLIVE) LIQD    Take 237 mLs by mouth 2 (two) times daily between meals.   GLIPIZIDE (GLUCOTROL XL) 5 MG 24 HR TABLET    Take 1 tablet (5 mg total) by mouth daily with breakfast.   GLUCOSE BLOOD (ONETOUCH VERIO) TEST STRIP    1 each by Other route as needed for other. Use as instructed   KLOR-CON M10 10 MEQ TABLET    TAKE ONE TABLET BY MOUTH ONCE DAILY   LEVETIRACETAM (KEPPRA) 500 MG TABLET    Take 1 tablet (500 mg total) by mouth 2 (two) times daily.   LISINOPRIL (PRINIVIL,ZESTRIL) 40 MG TABLET    Take 0.5 tablets (20 mg total) by mouth at bedtime.   MEMANTINE (NAMENDA XR) 28 MG CP24 24 HR CAPSULE    Take 1 capsule by mouth at bedtime.    METFORMIN (GLUCOPHAGE-XR) 500 MG 24 HR TABLET    TAKE TWO TABLETS BY MOUTH ONCE DAILY   METOPROLOL-HYDROCHLOROTHIAZIDE (LOPRESSOR HCT) 100-50 MG PER TABLET    TAKE ONE TABLET BY  MOUTH ONCE DAILY   ONE TOUCH LANCETS MISC    1 Device by Does not apply route daily.   PRAVASTATIN (PRAVACHOL) 40 MG TABLET    Take 1 tablet (40 mg total) by mouth daily.   VITAMIN B-12 1000 MCG TABLET    Take 1 tablet (1,000 mcg total) by mouth daily.   VITAMIN D, ERGOCALCIFEROL, (DRISDOL) 50000 UNITS CAPS CAPSULE    TAKE ONE CAPSULE BY MOUTH ONCE A WEEK    Review of Systems  Constitutional: Negative for fever, chills, appetite change and fatigue.  Respiratory: Positive for cough and shortness of breath. Negative for chest tightness and wheezing.   Cardiovascular: Negative for chest pain and palpitations.  Gastrointestinal: Negative for nausea, vomiting and abdominal pain.  Neurological: Negative for dizziness and weakness.    Social History  Substance Use Topics  . Smoking status: Former Smoker -- 0.50 packs/day for 8 years    Types: Cigarettes  . Smokeless tobacco: Never Used  . Alcohol Use: No   Objective:   BP 120/60 mmHg  Pulse 97  Temp(Src) 98.7 F (37.1 C) (Oral)  Resp 16  Ht 5' 7.25" (1.708 m)  Wt 184 lb (83.462 kg)  BMI 28.61 kg/m2  SpO2 94%  Physical  Exam  General Appearance:    Alert, cooperative, no distress  HENT:   bilateral TM normal without fluid or infection, neck without nodes, throat normal without erythema or exudate   Eyes:    PERRL, conjunctiva/corneas clear, EOM's intact       Lungs:     Occasional expiratory wheeze, no rales,  respirations unlabored  Heart:    Regular rate and rhythm  Neurologic:   Awake, alert, oriented x 3. No apparent focal neurological           defect.           Assessment & Plan:     1. Bronchitis  - levofloxacin (LEVAQUIN) 500 MG tablet; Take 1 tablet (500 mg total) by mouth daily.  Dispense: 7 tablet; Refill: 0   Call if symptoms change or if not rapidly improving.          Lelon Huh, MD  [facility]

## 2016-01-27 NOTE — Patient Instructions (Signed)
Please put glipizide on hold until finished with levofloxacin

## 2016-02-20 ENCOUNTER — Ambulatory Visit (INDEPENDENT_AMBULATORY_CARE_PROVIDER_SITE_OTHER): Payer: Commercial Managed Care - HMO | Admitting: Family Medicine

## 2016-02-20 ENCOUNTER — Encounter: Payer: Self-pay | Admitting: Family Medicine

## 2016-02-20 VITALS — BP 104/54 | HR 67 | Temp 98.6°F | Resp 16 | Ht 67.25 in | Wt 182.0 lb

## 2016-02-20 DIAGNOSIS — G309 Alzheimer's disease, unspecified: Secondary | ICD-10-CM | POA: Diagnosis not present

## 2016-02-20 DIAGNOSIS — E559 Vitamin D deficiency, unspecified: Secondary | ICD-10-CM | POA: Diagnosis not present

## 2016-02-20 DIAGNOSIS — E785 Hyperlipidemia, unspecified: Secondary | ICD-10-CM | POA: Diagnosis not present

## 2016-02-20 DIAGNOSIS — E0781 Sick-euthyroid syndrome: Secondary | ICD-10-CM

## 2016-02-20 DIAGNOSIS — D649 Anemia, unspecified: Secondary | ICD-10-CM | POA: Diagnosis not present

## 2016-02-20 DIAGNOSIS — F0280 Dementia in other diseases classified elsewhere without behavioral disturbance: Secondary | ICD-10-CM

## 2016-02-20 DIAGNOSIS — I10 Essential (primary) hypertension: Secondary | ICD-10-CM

## 2016-02-20 DIAGNOSIS — E119 Type 2 diabetes mellitus without complications: Secondary | ICD-10-CM

## 2016-02-20 LAB — POCT GLYCOSYLATED HEMOGLOBIN (HGB A1C)
Est. average glucose Bld gHb Est-mCnc: 174
Hemoglobin A1C: 7.7

## 2016-02-20 NOTE — Progress Notes (Signed)
Patient: Briana Austin Female    DOB: August 15, 1934   82 y.o.   MRN: [id number] Visit Date: 02/20/2016  Today's Provider: Lelon Huh, MD   Chief Complaint  Patient presents with  . Follow-up  . Diabetes  . Hypertension  . Dementia   Subjective:    HPI    Follow-up for Dementia from 11/13/2015; no changes, continue current medications.    Diabetes Mellitus Type II, Follow-up:   Lab Results  Component Value Date   HGBA1C 7.6 10/18/2015   HGBA1C 6.7* 07/04/2015   HGBA1C 6.4 04/17/2015   Last seen for diabetes 3 months ago.  Management since then include; no changes, continue current medications. She reports good compliance with treatment. She is not having side effects. none Current symptoms include none and have been stable. Home blood sugar records: fasting range: 123  Episodes of hypoglycemia? no   Current Insulin Regimen: n/a Most Recent Eye Exam: due Weight trend: stable Prior visit with dietician: no Current diet: in general, an "unhealthy" diet Current exercise: none  ----------------------------------------------------------------------     Hypertension, follow-up:  BP Readings from Last 3 Encounters:  02/20/16 104/54  01/27/16 120/60  01/20/16 124/58    She was last seen for hypertension 3 months ago.  BP at that visit was 114/70. Management since that visit include; no changes, continue current medications.She reports good compliance with treatment. She is not having side effects. none  She is not exercising. She is not adherent to low salt diet.   Outside blood pressures are n/a. She is experiencing none.  Patient denies none.   Cardiovascular risk factors include diabetes mellitus.  Use of agents associated with hypertension: none.   ----------------------------------------------------------------------     No Known Allergies Previous Medications   ASPIRIN EC 81 MG TABLET    Take 81 mg by mouth daily.   CALCIUM CARBONATE  (OS-CAL) 600 MG TABS TABLET    Take 1 tablet by mouth daily.   DONEPEZIL (ARICEPT) 5 MG TABLET    Take 1 tablet (5 mg total) by mouth at bedtime.   FEEDING SUPPLEMENT, ENSURE ENLIVE, (ENSURE ENLIVE) LIQD    Take 237 mLs by mouth 2 (two) times daily between meals.   GLIPIZIDE (GLUCOTROL XL) 5 MG 24 HR TABLET    Take 1 tablet (5 mg total) by mouth daily with breakfast.   GLUCOSE BLOOD (ONETOUCH VERIO) TEST STRIP    1 each by Other route as needed for other. Use as instructed   KLOR-CON M10 10 MEQ TABLET    TAKE ONE TABLET BY MOUTH ONCE DAILY   LEVETIRACETAM (KEPPRA) 500 MG TABLET    Take 1 tablet (500 mg total) by mouth 2 (two) times daily.   LISINOPRIL (PRINIVIL,ZESTRIL) 40 MG TABLET    Take 0.5 tablets (20 mg total) by mouth at bedtime.   MEMANTINE (NAMENDA XR) 28 MG CP24 24 HR CAPSULE    Take 1 capsule by mouth at bedtime.    METFORMIN (GLUCOPHAGE-XR) 500 MG 24 HR TABLET    TAKE TWO TABLETS BY MOUTH ONCE DAILY   METOPROLOL-HYDROCHLOROTHIAZIDE (LOPRESSOR HCT) 100-50 MG PER TABLET    TAKE ONE TABLET BY MOUTH ONCE DAILY   ONE TOUCH LANCETS MISC    1 Device by Does not apply route daily.   PRAVASTATIN (PRAVACHOL) 40 MG TABLET    Take 1 tablet (40 mg total) by mouth daily.   VITAMIN B-12 1000 MCG TABLET    Take 1 tablet (1,000 mcg  total) by mouth daily.   VITAMIN D, ERGOCALCIFEROL, (DRISDOL) 50000 UNITS CAPS CAPSULE    TAKE ONE CAPSULE BY MOUTH ONCE A WEEK    Review of Systems  Constitutional: Negative for fever, chills, appetite change and fatigue.  Respiratory: Negative for chest tightness and shortness of breath.   Cardiovascular: Negative for chest pain and palpitations.  Gastrointestinal: Negative for nausea, vomiting and abdominal pain.  Neurological: Negative for dizziness and weakness.    Social History  Substance Use Topics  . Smoking status: Former Smoker -- 0.50 packs/day for 8 years    Types: Cigarettes  . Smokeless tobacco: Never Used  . Alcohol Use: No   Objective:   BP  104/54 mmHg  Pulse 67  Temp(Src) 98.6 F (37 C) (Oral)  Resp 16  Ht 5' 7.25" (1.708 m)  Wt 182 lb (82.555 kg)  BMI 28.30 kg/m2  SpO2 97%  Physical Exam   General Appearance:    Alert, cooperative, no distress  Eyes:    PERRL, conjunctiva/corneas clear, EOM's intact       Lungs:     Clear to auscultation bilaterally, respirations unlabored  Heart:    Regular rate and rhythm  Neurologic:   Awake, alert, oriented x 1. No apparent focal neurological           defect.       Results for orders placed or performed in visit on 02/20/16  POCT glycosylated hemoglobin (Hb A1C)  Result Value Ref Range   Hemoglobin A1C 7.7    Est. average glucose Bld gHb Est-mCnc 174        Assessment & Plan:     1. Type 2 diabetes mellitus without complication, without long-term current use of insulin (HCC) Stable. Continue current medications.   - POCT glycosylated hemoglobin (Hb A1C)  2. Dementia in Alzheimer's disease Stable.   3. Essential hypertension Well controlled.  Continue current medications.   - Renal function panel  4. Euthyroid sick syndrome Due to check thyroid functions.  - T4 AND TSH  5. Vitamin D deficiency  - VITAMIN D 25 Hydroxy (Vit-D Deficiency, Fractures)  6. Normocytic anemia  - CBC  7. Hyperlipidemia She is tolerating pravastatin well with no adverse effects.   - Hepatic function panel - Lipid panel       Lelon Huh, MD  [facility]

## 2016-02-21 LAB — CBC
HEMATOCRIT: 32.4 % — AB (ref 34.0–46.6)
HEMOGLOBIN: 10.6 g/dL — AB (ref 11.1–15.9)
MCH: 31.3 pg (ref 26.6–33.0)
MCHC: 32.7 g/dL (ref 31.5–35.7)
MCV: 96 fL (ref 79–97)
Platelets: 251 10*3/uL (ref 150–379)
RBC: 3.39 x10E6/uL — ABNORMAL LOW (ref 3.77–5.28)
RDW: 13.6 % (ref 12.3–15.4)
WBC: 5.8 10*3/uL (ref 3.4–10.8)

## 2016-02-21 LAB — RENAL FUNCTION PANEL
Albumin: 4.2 g/dL (ref 3.5–4.7)
BUN / CREAT RATIO: 13 (ref 12–28)
BUN: 19 mg/dL (ref 8–27)
CALCIUM: 10.6 mg/dL — AB (ref 8.7–10.3)
CO2: 20 mmol/L (ref 18–29)
CREATININE: 1.49 mg/dL — AB (ref 0.57–1.00)
Chloride: 97 mmol/L (ref 96–106)
GFR calc non Af Amer: 33 mL/min/{1.73_m2} — ABNORMAL LOW (ref >59)
GFR, EST AFRICAN AMERICAN: 38 mL/min/{1.73_m2} — AB (ref >59)
Glucose: 103 mg/dL — ABNORMAL HIGH (ref 65–99)
Phosphorus: 3 mg/dL (ref 2.5–4.5)
Potassium: 4.1 mmol/L (ref 3.5–5.2)
SODIUM: 136 mmol/L (ref 134–144)

## 2016-02-21 LAB — HEPATIC FUNCTION PANEL
ALK PHOS: 53 IU/L (ref 39–117)
ALT: 21 IU/L (ref 0–32)
AST: 22 IU/L (ref 0–40)
Bilirubin Total: 0.3 mg/dL (ref 0.0–1.2)
Bilirubin, Direct: 0.07 mg/dL (ref 0.00–0.40)
TOTAL PROTEIN: 7.7 g/dL (ref 6.0–8.5)

## 2016-02-21 LAB — T4 AND TSH
T4 TOTAL: 6.5 ug/dL (ref 4.5–12.0)
TSH: 1.84 u[IU]/mL (ref 0.450–4.500)

## 2016-02-21 LAB — LIPID PANEL
CHOL/HDL RATIO: 3.8 ratio (ref 0.0–4.4)
CHOLESTEROL TOTAL: 177 mg/dL (ref 100–199)
HDL: 47 mg/dL (ref >39)
LDL Calculated: 108 mg/dL — ABNORMAL HIGH (ref 0–99)
TRIGLYCERIDES: 108 mg/dL (ref 0–149)
VLDL Cholesterol Cal: 22 mg/dL (ref 5–40)

## 2016-02-21 LAB — VITAMIN D 25 HYDROXY (VIT D DEFICIENCY, FRACTURES): Vit D, 25-Hydroxy: 65.3 ng/mL (ref 30.0–100.0)

## 2016-02-25 ENCOUNTER — Other Ambulatory Visit: Payer: Self-pay | Admitting: Family Medicine

## 2016-04-29 ENCOUNTER — Ambulatory Visit: Payer: Commercial Managed Care - HMO | Admitting: Nurse Practitioner

## 2016-05-06 ENCOUNTER — Ambulatory Visit (INDEPENDENT_AMBULATORY_CARE_PROVIDER_SITE_OTHER): Payer: Commercial Managed Care - HMO | Admitting: Nurse Practitioner

## 2016-05-06 ENCOUNTER — Encounter: Payer: Self-pay | Admitting: Nurse Practitioner

## 2016-05-06 VITALS — BP 126/58 | HR 76 | Ht 67.25 in | Wt 186.2 lb

## 2016-05-06 DIAGNOSIS — I10 Essential (primary) hypertension: Secondary | ICD-10-CM | POA: Diagnosis not present

## 2016-05-06 DIAGNOSIS — F0280 Dementia in other diseases classified elsewhere without behavioral disturbance: Secondary | ICD-10-CM | POA: Diagnosis not present

## 2016-05-06 DIAGNOSIS — R569 Unspecified convulsions: Secondary | ICD-10-CM

## 2016-05-06 DIAGNOSIS — G459 Transient cerebral ischemic attack, unspecified: Secondary | ICD-10-CM | POA: Diagnosis not present

## 2016-05-06 DIAGNOSIS — G309 Alzheimer's disease, unspecified: Secondary | ICD-10-CM

## 2016-05-06 MED ORDER — LEVETIRACETAM 500 MG PO TABS: 500.0000 mg | ORAL_TABLET | Freq: Two times a day (BID) | ORAL | Status: DC

## 2016-05-06 NOTE — Patient Instructions (Signed)
Continue Keppra at current dose will refill Follow up yearly next with Dr. Krista Blue

## 2016-05-06 NOTE — Progress Notes (Signed)
[facility]  PATIENT: Briana Austin DOB: 21-Jan-1934   REASON FOR VISIT: Follow-up for history of TIA, seizure disorder, dementia HISTORY FROM:patient, husband and daughter    HISTORY OF PRESENT ILLNESS:Briana Austin, 82 year old female returns for follow-up. She was evaluated for seizure disorder 07/25/2015 by Dr. Krista Blue. She is currently on Keppra 500 twice daily without recurrent seizure activity. She is also on Aricept and Namenda for Alzheimer's dementia by Dr. Caryn Section. She no longer cooks. She needs minimal assistance with dressing and bathing. She can feed herself. No safety issues have been identified. No recent falls.She returns for reevaluation   HISTORY: Briana Austin is a 82 yo RH female, seen in refer by Dr. Birdie Sons for evaluation of seizure. She has past medical history of dementia, diabetes, hypertension, hyperlipidemia, had one seizure August 30 first 2016, was weakness by her husband, she had sudden onset right hand posturing, forceful head turning to the left side, body jerking, she was taken by ambulance to the emergency room, in August 30 first 7 AM, she had another recurrent episodes, similar presentation at [facility] She was loaded with Keppra 500 mg twice a day, family noticed increased drowsiness, no recurrent seizure. At baseline, she is forgetful, needing help bathing, still able to carry on a conversation, she retired from Psychologist, educational job, ambulate with a walker. There was no agitation, mild gait difficulty, no bowel bladder incontinence. I have reviewed MRI of the brain without contrast July 03 2015:No acute intracranial abnormality. Remote lacunar infarct of the right cerebellum. Normal variant MRA circle of Willis without significant proximal stenosis, aneurysm, or branch vessel occlusion. EEG September second 2016: Mild diffuse slowing of the background. Focal slowing over the left temporal region   REVIEW OF SYSTEMS: Full 14  system review of systems performed and notable only for those listed, all others are neg:  Constitutional: neg  Cardiovascular: neg Ear/Nose/Throat: neg  Skin: neg Eyes: neg Respiratory: neg Gastroitestinal: neg  Hematology/Lymphatic: neg  Endocrine: neg Musculoskeletal:neg Allergy/Immunology: neg Neurological: neg Psychiatric: neg Sleep : neg   ALLERGIES: No Known Allergies  HOME MEDICATIONS: Outpatient Prescriptions Prior to Visit  Medication Sig Dispense Refill  . aspirin EC 81 MG tablet Take 81 mg by mouth daily.    . calcium carbonate (OS-CAL) 600 MG TABS tablet Take 1 tablet by mouth daily.    Marland Kitchen donepezil (ARICEPT) 5 MG tablet Take 1 tablet (5 mg total) by mouth at bedtime. 30 tablet 12  . feeding supplement, ENSURE ENLIVE, (ENSURE ENLIVE) LIQD Take 237 mLs by mouth 2 (two) times daily between meals. (Patient taking differently: Take 237 mLs by mouth as needed. ) 237 mL 12  . glipiZIDE (GLUCOTROL XL) 5 MG 24 hr tablet Take 1 tablet (5 mg total) by mouth daily with breakfast. 30 tablet 12  . glucose blood (ONETOUCH VERIO) test strip 1 each by Other route as needed for other. Use as instructed 100 each 4  . KLOR-CON M10 10 MEQ tablet TAKE ONE TABLET BY MOUTH ONCE DAILY 30 tablet 6  . levETIRAcetam (KEPPRA) 500 MG tablet Take 1 tablet (500 mg total) by mouth 2 (two) times daily. 60 tablet 11  . lisinopril (PRINIVIL,ZESTRIL) 40 MG tablet Take 0.5 tablets (20 mg total) by mouth at bedtime. 1 tablet 0  . metFORMIN (GLUCOPHAGE-XR) 500 MG 24 hr tablet TAKE TWO TABLETS BY MOUTH ONCE DAILY 60 tablet 12  . metoprolol-hydrochlorothiazide (LOPRESSOR HCT) 100-50 MG per tablet TAKE ONE TABLET BY MOUTH ONCE DAILY (Patient  taking differently: TAKE ONE-HALF TABLET BY MOUTH ONCE DAILY AT BEDTIME) 30 tablet 12  . NAMENDA XR 28 MG CP24 24 hr capsule TAKE ONE CAPSULE BY MOUTH ONCE DAILY 30 capsule 12  . ONE TOUCH LANCETS MISC 1 Device by Does not apply route daily. 100 each 4  . pravastatin  (PRAVACHOL) 40 MG tablet Take 1 tablet (40 mg total) by mouth daily. (Patient taking differently: Take 40 mg by mouth at bedtime. ) 30 tablet 12  . vitamin B-12 1000 MCG tablet Take 1 tablet (1,000 mcg total) by mouth daily. 30 tablet 1  . Vitamin D, Ergocalciferol, (DRISDOL) 50000 units CAPS capsule TAKE ONE CAPSULE BY MOUTH ONCE A WEEK 12 capsule 4   No facility-administered medications prior to visit.    PAST MEDICAL HISTORY: Past Medical History  Diagnosis Date  . History of breast cancer   . Dementia   . Diabetes mellitus without complication ([place])   . Hypertension   . Seizure ([place])   . CAP (community acquired pneumonia) 06/14/2015  . Cancer [facility])     right breast 2005 with radiation therapy  . Breast cancer ([place]) 2005    positive, radiation    PAST SURGICAL HISTORY: Past Surgical History  Procedure Laterality Date  . Sigmoid polyp excision  11/24/2004    Hyperplastic polyp  . Breast lumpectomy Right 2005    Carcinoma in situ of right breast  . Total abdominal hysterectomy w/ bilateral salpingoophorectomy  1981    Benign  . Breast biopsy Right     negative 2011  . Breast excisional biopsy Right     positive 2005    FAMILY HISTORY: Family History  Problem Relation Age of Onset  . Stroke Mother   . Dementia Brother     SOCIAL HISTORY: Social History   Social History  . Marital Status: Married    Spouse Name: N/A  . Number of Children: 1  . Years of Education: HS Grad   Occupational History  . Retired     [employer]   Social History Main Topics  . Smoking status: Former Smoker -- 0.50 packs/day for 8 years    Types: Cigarettes  . Smokeless tobacco: Never Used  . Alcohol Use: No  . Drug Use: No  . Sexual Activity: No   Other Topics Concern  . Not on file   Social History Narrative   Lives at home with her husband.   Right-handed.   1 cup caffeine daily.     PHYSICAL EXAM  Filed Vitals:   05/06/16 1338  Height: 5' 7.25" (1.708 m)    Weight: 186 lb 3.2 oz (84.46 kg)   Body mass index is 28.95 kg/(m^2). Gen: NAD, conversant, well nourised, obese, well groomed  Cardiovascular: Regular rate rhythm,  Neck: Supple, no carotid bruit. NEUROLOGICAL EXAM:  MENTAL STATUS: Speech:Speech is normal; fluent and spontaneous with normal comprehension.  Cognition:Mini-Mental Status Examination is 18/30. Missed items in orientation calculation, 3 of 3 recall.  AFT 5.. Clock drawing 3/4. Last MMSE 17/30.  CRANIAL NERVES: CN II: Visual fields are full to confrontation. Pupils are round equal and briskly reactive to light. CN III, IV, VI: extraocular movement are normal. No ptosis. CN V: Facial sensation is intact to pinprick in all 3 divisions bilaterally. .  CN VII: Face is symmetric with normal eye closure and smile. CN VIII: Hearing is normal to rubbing fingers CN IX, X: Palate elevates symmetrically. Phonation is normal. CN XI: Head turning and shoulder  shrug are intact CN XII: Tongue is midline with normal movements and no atrophy. MOTOR: There is no pronator drift of out-stretched arms. Muscle bulk and tone are normal. Muscle strength is normal. REFLEXES: Reflexes are 2+ and symmetric at the biceps, triceps, knees, and ankles. Plantar responses are flexor. COORDINATION: Rapid alternating movements and fine finger movements are intact. There is no dysmetria on finger-to-nose and heel-knee-shin.  GAIT/STANCE: Slow mildly unsteady gait, no assistive device DIAGNOSTIC DATA (LABS, IMAGING, TESTING) - I reviewed patient records, labs, notes, testing and imaging myself where available.  Lab Results  Component Value Date   WBC 5.8 02/20/2016   HGB 8.9* 07/05/2015   HCT 32.4* 02/20/2016   MCV 96 02/20/2016   PLT 251 02/20/2016      Component Value Date/Time   NA 136 02/20/2016 0914   NA 132* 07/05/2015 0432   NA 139 09/14/2014   K 4.1 02/20/2016 0914   K 4.4 09/14/2014   K 3.9 01/25/2014 1040    CL 97 02/20/2016 0914   CL 100 01/25/2014 1040   CO2 20 02/20/2016 0914   CO2 24 01/25/2014 1040   GLUCOSE 103* 02/20/2016 0914   GLUCOSE 114* 07/05/2015 0432   GLUCOSE 229* 01/25/2014 1040   BUN 19 02/20/2016 0914   BUN 6 07/05/2015 0432   BUN 20 09/14/2014   CREATININE 1.49* 02/20/2016 0914   CREATININE 1.2* 03/14/2015   CREATININE 1.33 09/14/2014   CREATININE 1.64* 01/25/2014 1040   CALCIUM 10.6* 02/20/2016 0914   CALCIUM 10.0 01/25/2014 1040   PROT 7.7 02/20/2016 0914   PROT 6.7 07/04/2015 0630   PROT 8.3* 01/25/2014 1040   ALBUMIN 4.2 02/20/2016 0914   ALBUMIN 3.0* 07/05/2015 0432   ALBUMIN 3.6 01/25/2014 1040   AST 22 02/20/2016 0914   AST 22 01/25/2014 1040   ALT 21 02/20/2016 0914   ALT 26 01/25/2014 1040   ALT 20 04/12/2013   ALKPHOS 53 02/20/2016 0914   ALKPHOS 60 01/25/2014 1040   BILITOT 0.3 02/20/2016 0914   BILITOT 0.7 07/04/2015 0630   BILITOT 0.3 01/25/2014 1040   GFRNONAA 33* 02/20/2016 0914   GFRNONAA 30* 01/25/2014 1040   GFRNONAA 37* 01/19/2012 0939   GFRAA 38* 02/20/2016 0914   GFRAA 34* 01/25/2014 1040   GFRAA 45* 01/19/2012 0939   Lab Results  Component Value Date   CHOL 177 02/20/2016   HDL 47 02/20/2016   LDLCALC 108* 02/20/2016   TRIG 108 02/20/2016   CHOLHDL 3.8 02/20/2016   Lab Results  Component Value Date   HGBA1C 7.7 02/20/2016    Lab Results  Component Value Date   TSH 1.840 02/20/2016      ASSESSMENT AND PLAN 82 y.o. year old female has a past medical history of Dementia; Diabetes mellitus without complication ([place]); Hypertension; Seizure [facility]); here to follow-up for seizure disorder which is stable.  Continue keppra at current dose Will refill for 1 year F/U in 1 year next with Dr. Luan Pulling, [facility], [facility], APRN  [facility] [address], [place], [place] [address] [phone]

## 2016-05-11 NOTE — Progress Notes (Signed)
I have reviewed and agreed above plan. 

## 2016-05-25 ENCOUNTER — Other Ambulatory Visit: Payer: Self-pay | Admitting: Family Medicine

## 2016-05-25 DIAGNOSIS — E785 Hyperlipidemia, unspecified: Secondary | ICD-10-CM

## 2016-05-25 DIAGNOSIS — I10 Essential (primary) hypertension: Secondary | ICD-10-CM

## 2016-06-19 ENCOUNTER — Ambulatory Visit (INDEPENDENT_AMBULATORY_CARE_PROVIDER_SITE_OTHER): Payer: Commercial Managed Care - HMO | Admitting: Family Medicine

## 2016-06-19 ENCOUNTER — Encounter: Payer: Self-pay | Admitting: Family Medicine

## 2016-06-19 VITALS — BP 100/58 | HR 86 | Temp 98.9°F | Resp 16 | Ht 67.25 in | Wt 185.0 lb

## 2016-06-19 DIAGNOSIS — G309 Alzheimer's disease, unspecified: Secondary | ICD-10-CM

## 2016-06-19 DIAGNOSIS — J40 Bronchitis, not specified as acute or chronic: Secondary | ICD-10-CM

## 2016-06-19 DIAGNOSIS — E119 Type 2 diabetes mellitus without complications: Secondary | ICD-10-CM | POA: Diagnosis not present

## 2016-06-19 DIAGNOSIS — F0280 Dementia in other diseases classified elsewhere without behavioral disturbance: Secondary | ICD-10-CM

## 2016-06-19 LAB — POCT GLYCOSYLATED HEMOGLOBIN (HGB A1C)
Est. average glucose Bld gHb Est-mCnc: 169
Hemoglobin A1C: 7.5

## 2016-06-19 MED ORDER — LEVOFLOXACIN 500 MG PO TABS: 500.0000 mg | ORAL_TABLET | Freq: Every day | ORAL | 0 refills | Status: DC

## 2016-06-19 NOTE — Progress Notes (Signed)
Patient: Briana Austin Female    DOB: 20-Jul-1934   82 y.o.   MRN: [id number] Visit Date: 06/19/2016  Today's Provider: Lelon Huh, MD   Chief Complaint  Patient presents with  . Follow-up  . Diabetes  . Hypertension  . Dementia   Subjective:    Patient has had a hacking cough for the past 10 days. Family has not noticed any other symptoms.    Cough  This is a new problem. The current episode started in the past 7 days (x 10 days). The problem has been gradually worsening. The problem occurs every few minutes. Cough characteristics: not sure. Pertinent negatives include no chest pain, chills, fever or shortness of breath.   family states she has been much more tired and lethargic the last 4-5 days.   Dementia in Alzheimer's disease: Continues on Aricept which she is tolerating well. Continue to slowly decline.     Diabetes Mellitus Type II, Follow-up:   Lab Results  Component Value Date   HGBA1C 7.7 02/20/2016   HGBA1C 7.6 10/18/2015   HGBA1C 6.7 (H) 07/04/2015   Last seen for diabetes 4 months ago.  Management since then includes; no changes. She reports good compliance with treatment. She is not having side effects. none Current symptoms include none and have been unchanged. Home blood sugar records: fasting range: 88  Episodes of hypoglycemia? no   Current Insulin Regimen: n/a Most Recent Eye Exam: due Weight trend: stable Prior visit with dietician: no Current diet: well balanced Current exercise: none  ----------------------------------------------------------------    Hypertension, follow-up:  BP Readings from Last 3 Encounters:  05/06/16 (!) 126/58  02/20/16 (!) 104/54  01/27/16 120/60    She was last seen for hypertension 4 months ago.  BP at that visit was 104/54. Management since that visit includes; no changes.She reports good compliance with treatment. She is not having side effects. none She is not exercising. She is not  adherent to low salt diet.   Outside blood pressures are n/a. She is experiencing none.  Patient denies none.   Cardiovascular risk factors include diabetes mellitus.  Use of agents associated with hypertension: none.   ----------------------------------------------------------------  No Known Allergies Current Meds  Medication Sig  . aspirin EC 81 MG tablet Take 81 mg by mouth daily.  . calcium carbonate (OS-CAL) 600 MG TABS tablet Take 1 tablet by mouth daily.  Marland Kitchen donepezil (ARICEPT) 5 MG tablet Take 1 tablet (5 mg total) by mouth at bedtime.  . feeding supplement, ENSURE ENLIVE, (ENSURE ENLIVE) LIQD Take 237 mLs by mouth 2 (two) times daily between meals. (Patient taking differently: Take 237 mLs by mouth as needed. )  . glipiZIDE (GLUCOTROL XL) 5 MG 24 hr tablet Take 1 tablet (5 mg total) by mouth daily with breakfast.  . glucose blood (ONETOUCH VERIO) test strip 1 each by Other route as needed for other. Use as instructed  . KLOR-CON M10 10 MEQ tablet TAKE ONE TABLET BY MOUTH ONCE DAILY  . levETIRAcetam (KEPPRA) 500 MG tablet Take 1 tablet (500 mg total) by mouth 2 (two) times daily.  Marland Kitchen lisinopril (PRINIVIL,ZESTRIL) 40 MG tablet TAKE ONE TABLET BY MOUTH ONCE DAILY  . metFORMIN (GLUCOPHAGE-XR) 500 MG 24 hr tablet TAKE TWO TABLETS BY MOUTH ONCE DAILY  . metoprolol-hydrochlorothiazide (LOPRESSOR HCT) 100-50 MG per tablet TAKE ONE TABLET BY MOUTH ONCE DAILY (Patient taking differently: TAKE ONE-HALF TABLET BY MOUTH ONCE DAILY AT BEDTIME)  . NAMENDA XR 28  MG CP24 24 hr capsule TAKE ONE CAPSULE BY MOUTH ONCE DAILY  . ONE TOUCH LANCETS MISC 1 Device by Does not apply route daily.  . pravastatin (PRAVACHOL) 40 MG tablet TAKE ONE TABLET BY MOUTH ONCE DAILY  . vitamin B-12 1000 MCG tablet Take 1 tablet (1,000 mcg total) by mouth daily.  . Vitamin D, Ergocalciferol, (DRISDOL) 50000 units CAPS capsule TAKE ONE CAPSULE BY MOUTH ONCE A WEEK    Review of Systems  Constitutional: Negative for  appetite change, chills, fatigue and fever.  Respiratory: Positive for cough. Negative for chest tightness and shortness of breath.   Cardiovascular: Negative for chest pain and palpitations.  Gastrointestinal: Negative for abdominal pain, nausea and vomiting.  Neurological: Negative for dizziness and weakness.    Social History  Substance Use Topics  . Smoking status: Former Smoker    Packs/day: 0.50    Years: 8.00    Types: Cigarettes  . Smokeless tobacco: Never Used  . Alcohol use No   Objective:   BP (!) 100/58 (BP Location: Left Arm, Patient Position: Sitting, Cuff Size: Large)   Pulse 86   Temp 98.9 F (37.2 C) (Oral)   Resp 16   Ht 5' 7.25" (1.708 m)   Wt 185 lb (83.9 kg)   SpO2 98%   BMI 28.76 kg/m   Physical Exam  General Appearance:    Alert, cooperative, no distress  HENT:   ENT exam normal, no neck nodes or sinus tenderness  Eyes:    PERRL, conjunctiva/corneas clear, EOM's intact       Lungs:     Occasional expiratory wheeze, no rales,  respirations unlabored  Heart:    Regular rate and rhythm  Neurologic:   Awake, alert, oriented x 3. No apparent focal neurological           defect.        Results for orders placed or performed in visit on 06/19/16  POCT glycosylated hemoglobin (Hb A1C)  Result Value Ref Range   Hemoglobin A1C 7.5    Est. average glucose Bld gHb Est-mCnc 169         Assessment & Plan:     1. Type 2 diabetes mellitus without complication, without long-term current use of insulin (HCC) Well controlled.  Continue current medications.   - POCT glycosylated hemoglobin (Hb A1C)  2. Dementia in Alzheimer's disease Stable continue current dose of Aricept  3. Bronchitis Start on levofloxacin and advised to call if not greatly improved next week.  - levofloxacin (LEVAQUIN) 500 MG tablet; Take 1 tablet (500 mg total) by mouth daily.  Dispense: 7 tablet; Refill: 0  Return in about 4 months (around 10/19/2016).        Lelon Huh,  MD  [facility]

## 2016-06-23 ENCOUNTER — Ambulatory Visit: Payer: Commercial Managed Care - HMO | Admitting: Family Medicine

## 2016-06-24 ENCOUNTER — Other Ambulatory Visit: Payer: Self-pay | Admitting: Family Medicine

## 2016-06-24 NOTE — Telephone Encounter (Signed)
LOV 06/19/2016. Renaldo Fiddler, CMA

## 2016-06-29 ENCOUNTER — Telehealth: Payer: Self-pay | Admitting: Family Medicine

## 2016-06-29 NOTE — Telephone Encounter (Signed)
Please review. Thanks!  

## 2016-06-29 NOTE — Telephone Encounter (Signed)
Briana Austin with [facility] [address] is requesting a Rx for a new meter, Accu-chek Aviva kit, test strips and soft clix lancets.  Please include how many time pt will test a day.  CB#[phone]/[id number]

## 2016-06-30 MED ORDER — ACCU-CHEK AVIVA DEVI: 0 refills | Status: DC

## 2016-06-30 MED ORDER — GLUCOSE BLOOD VI STRP: ORAL_STRIP | 4 refills | Status: DC

## 2016-07-02 ENCOUNTER — Telehealth: Payer: Self-pay | Admitting: *Deleted

## 2016-07-02 DIAGNOSIS — E119 Type 2 diabetes mellitus without complications: Secondary | ICD-10-CM

## 2016-07-02 MED ORDER — ACCU-CHEK SOFTCLIX LANCET DEV MISC: 4 refills | Status: DC

## 2016-07-02 NOTE — Telephone Encounter (Signed)
Rx called in to pharmacy. 

## 2016-08-22 ENCOUNTER — Ambulatory Visit (INDEPENDENT_AMBULATORY_CARE_PROVIDER_SITE_OTHER): Payer: Commercial Managed Care - HMO

## 2016-08-22 DIAGNOSIS — Z23 Encounter for immunization: Secondary | ICD-10-CM

## 2016-09-16 ENCOUNTER — Encounter: Payer: Self-pay | Admitting: Family Medicine

## 2016-09-16 ENCOUNTER — Ambulatory Visit (INDEPENDENT_AMBULATORY_CARE_PROVIDER_SITE_OTHER): Payer: Commercial Managed Care - HMO | Admitting: Family Medicine

## 2016-09-16 VITALS — BP 108/64 | Temp 99.3°F | Resp 16 | Wt 188.0 lb

## 2016-09-16 DIAGNOSIS — E119 Type 2 diabetes mellitus without complications: Secondary | ICD-10-CM | POA: Diagnosis not present

## 2016-09-16 DIAGNOSIS — F0280 Dementia in other diseases classified elsewhere without behavioral disturbance: Secondary | ICD-10-CM | POA: Diagnosis not present

## 2016-09-16 DIAGNOSIS — I10 Essential (primary) hypertension: Secondary | ICD-10-CM | POA: Diagnosis not present

## 2016-09-16 DIAGNOSIS — E784 Other hyperlipidemia: Secondary | ICD-10-CM | POA: Diagnosis not present

## 2016-09-16 DIAGNOSIS — G309 Alzheimer's disease, unspecified: Secondary | ICD-10-CM | POA: Diagnosis not present

## 2016-09-16 DIAGNOSIS — E7849 Other hyperlipidemia: Secondary | ICD-10-CM

## 2016-09-16 LAB — POCT GLYCOSYLATED HEMOGLOBIN (HGB A1C)
Est. average glucose Bld gHb Est-mCnc: 169
Hemoglobin A1C: 7.5

## 2016-09-16 MED ORDER — METOPROLOL-HYDROCHLOROTHIAZIDE 50-25 MG PO TABS
0.5000 | ORAL_TABLET | Freq: Every day | ORAL | 3 refills | Status: DC
Start: 2016-09-16 — End: 2017-05-04

## 2016-09-16 MED ORDER — LISINOPRIL 20 MG PO TABS: 20.0000 mg | ORAL_TABLET | Freq: Every day | ORAL | 3 refills | Status: DC

## 2016-09-16 NOTE — Progress Notes (Signed)
Patient: Briana Austin Female    DOB: February 14, 1934   82 y.o.   MRN: [id number] Visit Date: 09/16/2016  Today's Provider: Lelon Huh, MD   Chief Complaint  Patient presents with  . Diabetes  . Dementia  . Hypertension   Subjective:    HPI  Diabetes Mellitus Type II, Follow-up:   Lab Results  Component Value Date   HGBA1C 7.5 09/16/2016   HGBA1C 7.5 06/19/2016   HGBA1C 7.7 02/20/2016    Last seen for diabetes 3 months ago.  Management since then includes no changes. She reports good compliance with treatment. She is not having side effects.  Current symptoms include none and have been stable. Home blood sugar records: fasting range: 118  Episodes of hypoglycemia? no   Current Insulin Regimen:  Most Recent Eye Exam: once yearly Weight trend: stable Prior visit with dietician: no Current diet: well balanced Current exercise: none  Pertinent Labs:    Component Value Date/Time   CHOL 177 02/20/2016 0914   CHOL 187 09/14/2014   TRIG 108 02/20/2016 0914   TRIG 132 09/14/2014   HDL 47 02/20/2016 0914   LDLCALC 108 (H) 02/20/2016 0914   CREATININE 1.49 (H) 02/20/2016 0914   CREATININE 1.33 09/14/2014    Wt Readings from Last 3 Encounters:  09/16/16 188 lb (85.3 kg)  06/19/16 185 lb (83.9 kg)  05/06/16 186 lb 3.2 oz (84.5 kg)     Hypertension, follow-up:  BP Readings from Last 3 Encounters:  09/16/16 108/64  06/19/16 (!) 100/58  05/06/16 (!) 126/58    She was last seen for hypertension 3 months ago.  BP at that visit was 104/54. Management since that visit includes no changes. She reports good compliance with treatment. She is not having side effects.  She is not exercising. She is adherent to low salt diet.   Outside blood pressures are not being checked. Patient denies chest pain, chest pressure/discomfort, exertional chest pressure/discomfort, irregular heart beat, palpitations and tachypnea.   Cardiovascular risk factors include  diabetes mellitus and dyslipidemia.      Weight trend: stable Wt Readings from Last 3 Encounters:  09/16/16 188 lb (85.3 kg)  06/19/16 185 lb (83.9 kg)  05/06/16 186 lb 3.2 oz (84.5 kg)    Current diet: well balanced  Dementia, Follow up:  Patient was last seen for this about 3 months ago. No changes were made. Patient seems to be tolerating Aricept well.     No Known Allergies   Current Outpatient Prescriptions:  .  ACCU-CHEK SOFTCLIX LANCETS lancets, 1 each by Other route daily. Use as instructed , Disp: , Rfl:  .  aspirin EC 81 MG tablet, Take 81 mg by mouth daily., Disp: , Rfl:  .  Blood Glucose Monitoring Suppl (ACCU-CHEK AVIVA) device, Use as instructed, Disp: 1 each, Rfl: 0 .  calcium carbonate (OS-CAL) 600 MG TABS tablet, Take 1 tablet by mouth daily., Disp: , Rfl:  .  donepezil (ARICEPT) 5 MG tablet, Take 1 tablet (5 mg total) by mouth at bedtime., Disp: 30 tablet, Rfl: 12 .  feeding supplement, ENSURE ENLIVE, (ENSURE ENLIVE) LIQD, Take 237 mLs by mouth 2 (two) times daily between meals. (Patient taking differently: Take 237 mLs by mouth as needed. ), Disp: 237 mL, Rfl: 12 .  glipiZIDE (GLUCOTROL XL) 5 MG 24 hr tablet, Take 1 tablet (5 mg total) by mouth daily with breakfast., Disp: 30 tablet, Rfl: 12 .  glucose blood (ACCU-CHEK AVIVA PLUS)  test strip, Use to check blood sugar once a day with Soft-clix Lancet. E11.9, Disp: 100 each, Rfl: 4 .  KLOR-CON M10 10 MEQ tablet, TAKE ONE TABLET BY MOUTH ONCE DAILY, Disp: 30 tablet, Rfl: 6 .  Lancet Devices (ACCU-CHEK SOFTCLIX) lancets, Use as instructed, Disp: 100 each, Rfl: 4 .  levETIRAcetam (KEPPRA) 500 MG tablet, Take 1 tablet (500 mg total) by mouth 2 (two) times daily., Disp: 60 tablet, Rfl: 11 .  lisinopril (PRINIVIL,ZESTRIL) 40 MG tablet, TAKE ONE TABLET BY MOUTH ONCE DAILY, Disp: 30 tablet, Rfl: 12 .  metFORMIN (GLUCOPHAGE-XR) 500 MG 24 hr tablet, TAKE TWO TABLETS BY MOUTH ONCE DAILY, Disp: 60 tablet, Rfl: 12 .   metoprolol-hydrochlorothiazide (LOPRESSOR HCT) 100-50 MG tablet, TAKE ONE TABLET BY MOUTH ONCE DAILY, Disp: 30 tablet, Rfl: 12 .  NAMENDA XR 28 MG CP24 24 hr capsule, TAKE ONE CAPSULE BY MOUTH ONCE DAILY, Disp: 30 capsule, Rfl: 12 .  ONE TOUCH LANCETS MISC, 1 Device by Does not apply route daily., Disp: 100 each, Rfl: 4 .  pravastatin (PRAVACHOL) 40 MG tablet, TAKE ONE TABLET BY MOUTH ONCE DAILY, Disp: 30 tablet, Rfl: 12 .  vitamin B-12 1000 MCG tablet, Take 1 tablet (1,000 mcg total) by mouth daily., Disp: 30 tablet, Rfl: 1 .  Vitamin D, Ergocalciferol, (DRISDOL) 50000 units CAPS capsule, TAKE ONE CAPSULE BY MOUTH ONCE A WEEK, Disp: 12 capsule, Rfl: 4  Review of Systems  Constitutional: Negative.   Respiratory: Negative.   Cardiovascular: Negative.   Endocrine: Negative.   Musculoskeletal: Negative.   Neurological: Negative.   Psychiatric/Behavioral: Negative.     Social History  Substance Use Topics  . Smoking status: Former Smoker    Packs/day: 0.50    Years: 8.00    Types: Cigarettes  . Smokeless tobacco: Never Used  . Alcohol use No   Objective:   BP 108/64 (BP Location: Left Arm, Patient Position: Sitting, Cuff Size: Large)   Temp 99.3 F (37.4 C)   Resp 16   Wt 188 lb (85.3 kg)   BMI 29.23 kg/m   Physical Exam   General Appearance:    Alert, cooperative, no distress  Eyes:    PERRL, conjunctiva/corneas clear, EOM's intact       Lungs:     Clear to auscultation bilaterally, respirations unlabored  Heart:    Regular rate and rhythm  Neurologic:   Awake, alert, oriented x 1. No apparent focal neurological           defect.       Results for orders placed or performed in visit on 09/16/16  POCT glycosylated hemoglobin (Hb A1C)  Result Value Ref Range   Hemoglobin A1C 7.5    Est. average glucose Bld gHb Est-mCnc 169         Assessment & Plan:     1. Essential hypertension BP has been consistently low the last few office visit. Will reduce lopressor-hctz to  1/2 of 50-25mg  tablet. Change lisinopril for 1/2 of 40mg  tab to 1 full 20mg  tablet daily - metoprolol-hydrochlorothiazide (LOPRESSOR HCT) 50-25 MG tablet; Take 0.5 tablets by mouth daily.  Dispense: 30 tablet; Refill: 3 - lisinopril (PRINIVIL,ZESTRIL) 20 MG tablet; Take 1 tablet (20 mg total) by mouth daily.  Dispense: 30 tablet; Refill: 3  2. Type 2 diabetes mellitus without complication, without long-term current use of insulin (HCC) Very stable. Continue current medications.   - POCT glycosylated hemoglobin (Hb A1C)  3. Dementia in Alzheimer's disease Stable on current meds.  4. Other hyperlipidemia She is tolerating pravastatin well with no adverse effects.    Return in about 3 months (around 12/28/2016).     The entirety of the information documented in the History of Present Illness, Review of Systems and Physical Exam were personally obtained by me. Portions of this information were initially documented by Wilburt Finlay, CMA and reviewed by me for thoroughness and accuracy.   \ Lelon Huh, MD  [facility]

## 2016-09-29 ENCOUNTER — Other Ambulatory Visit: Payer: Self-pay | Admitting: Family Medicine

## 2016-09-29 DIAGNOSIS — Z1231 Encounter for screening mammogram for malignant neoplasm of breast: Secondary | ICD-10-CM

## 2016-10-01 ENCOUNTER — Encounter: Payer: Self-pay | Admitting: Family Medicine

## 2016-11-06 ENCOUNTER — Ambulatory Visit
Admission: RE | Admit: 2016-11-06 | Discharge: 2016-11-06 | Disposition: A | Discharge status: Home / Self Care | Payer: Medicare HMO | Source: Ambulatory Visit | Attending: Family Medicine | Admitting: Family Medicine

## 2016-11-06 DIAGNOSIS — R928 Other abnormal and inconclusive findings on diagnostic imaging of breast: Secondary | ICD-10-CM | POA: Insufficient documentation

## 2016-11-06 DIAGNOSIS — Z1231 Encounter for screening mammogram for malignant neoplasm of breast: Secondary | ICD-10-CM

## 2016-11-09 ENCOUNTER — Other Ambulatory Visit: Payer: Self-pay | Admitting: Family Medicine

## 2016-11-09 DIAGNOSIS — N6320 Unspecified lump in the left breast, unspecified quadrant: Secondary | ICD-10-CM

## 2016-11-09 DIAGNOSIS — R928 Other abnormal and inconclusive findings on diagnostic imaging of breast: Secondary | ICD-10-CM

## 2016-11-13 ENCOUNTER — Ambulatory Visit
Admission: RE | Admit: 2016-11-13 | Discharge: 2016-11-13 | Disposition: A | Discharge status: Home / Self Care | Payer: Commercial Managed Care - HMO | Source: Ambulatory Visit | Attending: Family Medicine | Admitting: Family Medicine

## 2016-11-13 DIAGNOSIS — N6002 Solitary cyst of left breast: Secondary | ICD-10-CM | POA: Diagnosis not present

## 2016-11-13 DIAGNOSIS — N6320 Unspecified lump in the left breast, unspecified quadrant: Secondary | ICD-10-CM

## 2016-11-13 DIAGNOSIS — R928 Other abnormal and inconclusive findings on diagnostic imaging of breast: Secondary | ICD-10-CM | POA: Diagnosis not present

## 2016-11-13 DIAGNOSIS — N6012 Diffuse cystic mastopathy of left breast: Secondary | ICD-10-CM | POA: Diagnosis not present

## 2016-11-19 ENCOUNTER — Other Ambulatory Visit: Payer: Self-pay | Admitting: Family Medicine

## 2016-11-19 DIAGNOSIS — G309 Alzheimer's disease, unspecified: Principal | ICD-10-CM

## 2016-11-19 DIAGNOSIS — E119 Type 2 diabetes mellitus without complications: Secondary | ICD-10-CM

## 2016-11-19 DIAGNOSIS — F0280 Dementia in other diseases classified elsewhere without behavioral disturbance: Secondary | ICD-10-CM

## 2016-12-28 ENCOUNTER — Ambulatory Visit (INDEPENDENT_AMBULATORY_CARE_PROVIDER_SITE_OTHER): Payer: Medicare HMO | Admitting: Family Medicine

## 2016-12-28 VITALS — BP 128/62 | Wt 192.0 lb

## 2016-12-28 DIAGNOSIS — G309 Alzheimer's disease, unspecified: Secondary | ICD-10-CM | POA: Diagnosis not present

## 2016-12-28 DIAGNOSIS — E119 Type 2 diabetes mellitus without complications: Secondary | ICD-10-CM | POA: Diagnosis not present

## 2016-12-28 DIAGNOSIS — F0280 Dementia in other diseases classified elsewhere without behavioral disturbance: Secondary | ICD-10-CM | POA: Diagnosis not present

## 2016-12-28 LAB — POCT GLYCOSYLATED HEMOGLOBIN (HGB A1C)
Est. average glucose Bld gHb Est-mCnc: 186
Hemoglobin A1C: 8.1

## 2016-12-28 NOTE — Progress Notes (Signed)
Patient: Briana Austin Female    DOB: [birth year]   83 y.o.   MRN: [id number] Visit Date: 12/28/2016  Today's Provider: Lelon Huh, MD   Chief Complaint  Patient presents with  . Diabetes  . Hypertension  . Hyperlipidemia  . Dementia   Subjective:    HPI  Diabetes Mellitus Type II, Follow-up:   Lab Results  Component Value Date   HGBA1C 8.1 12/28/2016   HGBA1C 7.5 09/16/2016   HGBA1C 7.5 06/19/2016    Last seen for diabetes 3 months ago.  Management since then includes no changes. She reports good compliance with treatment. She is not having side effects.  Current symptoms include none and have been stable. Home blood sugar records: fasting range: 150s  Episodes of hypoglycemia? no   Current Insulin Regimen: Most Recent Eye Exam: due  Weight trend: stable Prior visit with dietician: no Current diet: on average, 3 meals per day Current exercise: none  Pertinent Labs:    Component Value Date/Time   CHOL 177 02/20/2016 0914   CHOL 187 09/14/2014   TRIG 108 02/20/2016 0914   TRIG 132 09/14/2014   HDL 47 02/20/2016 0914   LDLCALC 108 (H) 02/20/2016 0914   CREATININE 1.49 (H) 02/20/2016 0914   CREATININE 1.33 09/14/2014    Wt Readings from Last 3 Encounters:  12/28/16 192 lb (87.1 kg)  09/16/16 188 lb (85.3 kg)  06/19/16 185 lb (83.9 kg)      Hypertension, follow-up:  BP Readings from Last 3 Encounters:  12/28/16 128/62  09/16/16 108/64  06/19/16 (!) 100/58    She was last seen for hypertension 3 months ago.  BP at that visit was 108/64. Management since that visit includes decreasing Lopressor to 1/2 tab daily. She reports good compliance with treatment. She is not having side effects.  She is not exercising. She is adherent to low salt diet.   Outside blood pressures are not being checked. She is experiencing none.  Patient denies exertional chest pressure/discomfort, lower extremity edema and palpitations.   Cardiovascular risk  factors include diabetes mellitus and dyslipidemia.   Weight trend: stable Wt Readings from Last 3 Encounters:  12/28/16 192 lb (87.1 kg)  09/16/16 188 lb (85.3 kg)  06/19/16 185 lb (83.9 kg)    Current diet: well balanced    Lipid/Cholesterol, Follow-up:   Last seen for this3 months ago.  Management changes since that visit include no changes. . Last Lipid Panel:    Component Value Date/Time   CHOL 177 02/20/2016 0914   CHOL 187 09/14/2014   TRIG 108 02/20/2016 0914   TRIG 132 09/14/2014   HDL 47 02/20/2016 0914   CHOLHDL 3.8 02/20/2016 0914   CHOLHDL 3.1 07/04/2015 1141   VLDL 12 07/04/2015 1141   LDLCALC 108 (H) 02/20/2016 0914    Risk factors for vascular disease include diabetes mellitus and hypertension  She reports good compliance with treatment. She is not having side effects.  Current symptoms include none and have been stable. Weight trend: stable Prior visit with dietician: no Current diet: on average, 3 meals per day Current exercise: none  Wt Readings from Last 3 Encounters:  12/28/16 192 lb (87.1 kg)  09/16/16 188 lb (85.3 kg)  06/19/16 185 lb (83.9 kg)           No Known Allergies   Current Outpatient Prescriptions:  .  ACCU-CHEK SOFTCLIX LANCETS lancets, 1 each by Other route daily. Use as instructed , Disp: ,  Rfl:  .  aspirin EC 81 MG tablet, Take 81 mg by mouth daily., Disp: , Rfl:  .  Blood Glucose Monitoring Suppl (ACCU-CHEK AVIVA) device, Use as instructed, Disp: 1 each, Rfl: 0 .  calcium carbonate (OS-CAL) 600 MG TABS tablet, Take 1 tablet by mouth daily., Disp: , Rfl:  .  donepezil (ARICEPT) 5 MG tablet, TAKE ONE TABLET BY MOUTH AT BEDTIME, Disp: 90 tablet, Rfl: 3 .  feeding supplement, ENSURE ENLIVE, (ENSURE ENLIVE) LIQD, Take 237 mLs by mouth 2 (two) times daily between meals. (Patient taking differently: Take 237 mLs by mouth as needed. ), Disp: 237 mL, Rfl: 12 .  glipiZIDE (GLUCOTROL XL) 5 MG 24 hr tablet, TAKE ONE TABLET BY  MOUTH ONCE DAILY WITH BREAKFAST, Disp: 90 tablet, Rfl: 3 .  glucose blood (ACCU-CHEK AVIVA PLUS) test strip, Use to check blood sugar once a day with Soft-clix Lancet. E11.9, Disp: 100 each, Rfl: 4 .  KLOR-CON M10 10 MEQ tablet, TAKE ONE TABLET BY MOUTH ONCE DAILY, Disp: 30 tablet, Rfl: 6 .  Lancet Devices (ACCU-CHEK SOFTCLIX) lancets, Use as instructed, Disp: 100 each, Rfl: 4 .  levETIRAcetam (KEPPRA) 500 MG tablet, Take 1 tablet (500 mg total) by mouth 2 (two) times daily., Disp: 60 tablet, Rfl: 11 .  lisinopril (PRINIVIL,ZESTRIL) 20 MG tablet, Take 1 tablet (20 mg total) by mouth daily., Disp: 30 tablet, Rfl: 3 .  metFORMIN (GLUCOPHAGE-XR) 500 MG 24 hr tablet, TAKE TWO TABLETS BY MOUTH ONCE DAILY, Disp: 60 tablet, Rfl: 12 .  metoprolol-hydrochlorothiazide (LOPRESSOR HCT) 50-25 MG tablet, Take 0.5 tablets by mouth daily., Disp: 30 tablet, Rfl: 3 .  NAMENDA XR 28 MG CP24 24 hr capsule, TAKE ONE CAPSULE BY MOUTH ONCE DAILY, Disp: 30 capsule, Rfl: 12 .  ONE TOUCH LANCETS MISC, 1 Device by Does not apply route daily., Disp: 100 each, Rfl: 4 .  pravastatin (PRAVACHOL) 40 MG tablet, TAKE ONE TABLET BY MOUTH ONCE DAILY, Disp: 30 tablet, Rfl: 12 .  vitamin B-12 1000 MCG tablet, Take 1 tablet (1,000 mcg total) by mouth daily., Disp: 30 tablet, Rfl: 1 .  Vitamin D, Ergocalciferol, (DRISDOL) 50000 units CAPS capsule, TAKE ONE CAPSULE BY MOUTH ONCE A WEEK, Disp: 12 capsule, Rfl: 4  Review of Systems  Constitutional: Negative for chills and fever.  Respiratory: Negative for chest tightness and shortness of breath.     Social History  Substance Use Topics  . Smoking status: Former Smoker    Packs/day: 0.50    Years: 8.00    Types: Cigarettes  . Smokeless tobacco: Never Used  . Alcohol use No   Objective:   BP 128/62 (BP Location: Right Arm, Patient Position: Sitting, Cuff Size: Normal)   Wt 192 lb (87.1 kg)   BMI 29.85 kg/m   Physical Exam  1. Type 2 diabetes mellitus without complication,  without long-term current use of insulin (HCC) Well controlled.  Continue current medications.  Up a bit due to eating 3-4 popsicles every day which her family is going to start restricting.  - POCT glycosylated hemoglobin (Hb A1C)  2. Dementia in Alzheimer's disease Stable. Continue current medications.     Follow up 4 months.      Assessment & Plan:           Lelon Huh, MD  [facility]

## 2017-01-06 ENCOUNTER — Other Ambulatory Visit: Payer: Self-pay | Admitting: Family Medicine

## 2017-01-06 DIAGNOSIS — I10 Essential (primary) hypertension: Secondary | ICD-10-CM

## 2017-01-15 ENCOUNTER — Other Ambulatory Visit: Payer: Self-pay | Admitting: Family Medicine

## 2017-02-08 ENCOUNTER — Other Ambulatory Visit: Payer: Self-pay | Admitting: *Deleted

## 2017-02-08 DIAGNOSIS — I10 Essential (primary) hypertension: Secondary | ICD-10-CM

## 2017-02-08 MED ORDER — PRAVASTATIN SODIUM 40 MG PO TABS: 40.0000 mg | ORAL_TABLET | Freq: Every day | ORAL | 3 refills | Status: DC

## 2017-02-08 MED ORDER — LISINOPRIL 20 MG PO TABS: 20.0000 mg | ORAL_TABLET | Freq: Every day | ORAL | 3 refills | Status: DC

## 2017-02-08 NOTE — Telephone Encounter (Signed)
Requesting 90 day supply.

## 2017-02-11 ENCOUNTER — Other Ambulatory Visit: Payer: Self-pay | Admitting: Family Medicine

## 2017-02-24 DIAGNOSIS — H2513 Age-related nuclear cataract, bilateral: Secondary | ICD-10-CM | POA: Diagnosis not present

## 2017-02-24 LAB — HM DIABETES EYE EXAM

## 2017-02-25 ENCOUNTER — Ambulatory Visit: Payer: Commercial Managed Care - HMO | Admitting: Family Medicine

## 2017-02-25 ENCOUNTER — Encounter: Payer: Self-pay | Admitting: *Deleted

## 2017-03-09 ENCOUNTER — Ambulatory Visit (INDEPENDENT_AMBULATORY_CARE_PROVIDER_SITE_OTHER): Payer: Medicare HMO

## 2017-03-09 VITALS — BP 130/66 | HR 76 | Temp 98.6°F | Ht 67.0 in | Wt 193.0 lb

## 2017-03-09 DIAGNOSIS — Z0000 Encounter for general adult medical examination without abnormal findings: Secondary | ICD-10-CM | POA: Diagnosis not present

## 2017-03-09 NOTE — Progress Notes (Signed)
Subjective:   Briana Austin is a 83 y.o. female who presents for Medicare Annual (Subsequent) preventive examination.  Review of Systems:  N/A  Cardiac Risk Factors include: advanced age (>72men, >30 women);diabetes mellitus;dyslipidemia;hypertension;obesity (BMI >30kg/m2)     Objective:     Vitals: BP 130/66 (BP Location: Right Arm)   Pulse 76   Temp 98.6 F (37 C) (Oral)   Ht 5\' 7"  (1.702 m)   Wt 193 lb (87.5 kg)   BMI 30.23 kg/m   Body mass index is 30.23 kg/m.   Tobacco History  Smoking Status  . Former Smoker  . Packs/day: 0.50  . Years: 8.00  . Types: Cigarettes  Smokeless Tobacco  . Never Used    Comment: > 50 years ago     Counseling given: Not Answered   Past Medical History:  Diagnosis Date  . Breast cancer ([place]) 2005   positive, radiation  . Cancer [facility])    right breast 2005 with radiation therapy  . CAP (community acquired pneumonia) 06/14/2015  . Dementia   . Diabetes mellitus without complication ([place])   . History of breast cancer   . Hypertension   . Seizure [facility])    Past Surgical History:  Procedure Laterality Date  . BREAST BIOPSY Right    negative 2011  . BREAST EXCISIONAL BIOPSY Right    positive 2005  . BREAST LUMPECTOMY Right 2005   Carcinoma in situ of right breast  . sigmoid polyp excision  11/24/2004   Hyperplastic polyp  . TOTAL ABDOMINAL HYSTERECTOMY W/ BILATERAL SALPINGOOPHORECTOMY  1981   Benign   Family History  Problem Relation Age of Onset  . Stroke Mother   . Dementia Brother    History  Sexual Activity  . Sexual activity: No    Outpatient Encounter Prescriptions as of 03/09/2017  Medication Sig  . ACCU-CHEK SOFTCLIX LANCETS lancets 1 each by Other route daily. Use as instructed   . aspirin EC 81 MG tablet Take 81 mg by mouth daily.  . Blood Glucose Monitoring Suppl (ACCU-CHEK AVIVA) device Use as instructed  . calcium carbonate (OS-CAL) 600 MG TABS tablet Take 1 tablet by mouth daily.  Marland Kitchen donepezil  (ARICEPT) 5 MG tablet TAKE ONE TABLET BY MOUTH AT BEDTIME  . glipiZIDE (GLUCOTROL XL) 5 MG 24 hr tablet TAKE ONE TABLET BY MOUTH ONCE DAILY WITH BREAKFAST  . glucose blood (ACCU-CHEK AVIVA PLUS) test strip Use to check blood sugar once a day with Soft-clix Lancet. E11.9  . KLOR-CON M10 10 MEQ tablet TAKE ONE TABLET BY MOUTH ONCE DAILY  . Lancet Devices (ACCU-CHEK SOFTCLIX) lancets Use as instructed  . levETIRAcetam (KEPPRA) 500 MG tablet Take 1 tablet (500 mg total) by mouth 2 (two) times daily.  Marland Kitchen lisinopril (PRINIVIL,ZESTRIL) 20 MG tablet Take 1 tablet (20 mg total) by mouth daily.  . metFORMIN (GLUCOPHAGE-XR) 500 MG 24 hr tablet TAKE TWO TABLETS BY MOUTH ONCE DAILY (Patient taking differently: TAKE TWO TABLETS BY MOUTH ONCE DAILY, only taking 1 a day)  . metoprolol-hydrochlorothiazide (LOPRESSOR HCT) 50-25 MG tablet Take 0.5 tablets by mouth daily.  Marland Kitchen NAMENDA XR 28 MG CP24 24 hr capsule TAKE ONE CAPSULE BY MOUTH ONCE DAILY  . ONE TOUCH LANCETS MISC 1 Device by Does not apply route daily.  . pravastatin (PRAVACHOL) 40 MG tablet Take 1 tablet (40 mg total) by mouth daily.  . vitamin B-12 1000 MCG tablet Take 1 tablet (1,000 mcg total) by mouth daily.  . Vitamin D, Ergocalciferol, (  DRISDOL) 50000 units CAPS capsule TAKE ONE CAPSULE BY MOUTH ONCE A WEEK  . feeding supplement, ENSURE ENLIVE, (ENSURE ENLIVE) LIQD Take 237 mLs by mouth 2 (two) times daily between meals. (Patient not taking: Reported on 03/09/2017)   No facility-administered encounter medications on file as of 03/09/2017.     Activities of Daily Living In your present state of health, do you have any difficulty performing the following activities: 03/09/2017  Hearing? N  Vision? N  Difficulty concentrating or making decisions? Y  Walking or climbing stairs? Y  Dressing or bathing? N  Doing errands, shopping? Y  Preparing Food and eating ? Y  Using the Toilet? N  In the past six months, have you accidently leaked urine? Y  Do you  have problems with loss of bowel control? N  Managing your Medications? Y  Managing your Finances? Y  Housekeeping or managing your Housekeeping? Y  Some recent data might be hidden    Patient Care Team: Birdie Sons, MD as PCP - General (Family Medicine) Estill Cotta, MD as Consulting Physician (Ophthalmology) Naoma Diener, MD as Referring Physician (Neurology)    Assessment:     Exercise Activities and Dietary recommendations Current Exercise Habits: The patient does not participate in regular exercise at present, Exercise limited by: respiratory conditions(s)  Goals    . Increase water intake          Recommend increasing water intake to 4 glasses of water every day.      Fall Risk Fall Risk  03/09/2017 10/18/2015  Falls in the past year? Yes No  Number falls in past yr: 1 -  Follow up Falls prevention discussed -   Depression Screen PHQ 2/9 Scores 03/09/2017 12/28/2016 10/18/2015 08/13/2015  PHQ - 2 Score 3 2 3 1   PHQ- 9 Score 8 8 16 8      Cognitive Function MMSE - Mini Mental State Exam 05/06/2016 10/29/2015  Orientation to time 0 2  Orientation to Place 3 3  Registration 3 3  Attention/ Calculation 3 3  Recall 0 0  Language- name 2 objects 2 2  Language- repeat 1 0  Language- follow 3 step command 3 2  Language- read & follow direction 1 1  Write a sentence 1 1  Copy design 1 0  Total score 18 17        Immunization History  Administered Date(s) Administered  . Influenza, High Dose Seasonal PF 07/26/2014, 08/13/2015, 08/22/2016  . Pneumococcal Conjugate-13 04/11/2014  . Pneumococcal Polysaccharide-23 09/21/2002   Screening Tests Health Maintenance  Topic Date Due  . FOOT EXAM  03/31/2017 (Originally 01/22/1944)  . TETANUS/TDAP  11/02/2026 (Originally 01/23/1953)  . INFLUENZA VACCINE  06/02/2017  . HEMOGLOBIN A1C  06/27/2017  . OPHTHALMOLOGY EXAM  02/24/2018  . DEXA SCAN  Completed  . PNA vac Low Risk Adult  Completed      Plan:  I have  personally reviewed and addressed the Medicare Annual Wellness questionnaire and have noted the following in the patient's chart:  A. Medical and social history B. Use of alcohol, tobacco or illicit drugs  C. Current medications and supplements D. Functional ability and status E.  Nutritional status F.  Physical activity G. Advance directives H. List of other physicians I.  Hospitalizations, surgeries, and ER visits in previous 12 months J.  [place] such as hearing and vision if needed, cognitive and depression L. Referrals and appointments - none  In addition, I have reviewed and discussed with  patient certain preventive protocols, quality metrics, and best practice recommendations. A written personalized care plan for preventive services as well as general preventive health recommendations were provided to patient.  See attached scanned questionnaire for additional information.   Signed,  Fabio Neighbors, LPN Nurse Health Advisor   MD Recommendations: Pt needs a diabetic foot exam at next OV with PCP. Pt declined tetanus vaccine today.  I have reviewed the health advisor's note, was available for consultation, and agree with documentation and plan  Lelon Huh, MD

## 2017-03-09 NOTE — Patient Instructions (Signed)
Ms. Grimley , Thank you for taking time to come for your Medicare Wellness Visit. I appreciate your ongoing commitment to your health goals. Please review the following plan we discussed and let me know if I can assist you in the future.   Screening recommendations/referrals: Colonoscopy: completed 11/24/04 Mammogram: completed 11/13/16, due 11/2017 Bone Density: completed 10/31/15 Recommended yearly ophthalmology/optometry visit for glaucoma screening and checkup Recommended yearly dental visit for hygiene and checkup  Vaccinations: Influenza vaccine: up to date, due 07/2017 Pneumococcal vaccine: completed series Tdap vaccine: declined Shingles vaccine: declined   Advanced directives: Please bring a copy of your POA (Power of Attorney) and/or Living Will to your next appointment.   Conditions/risks identified: Recommend increasing water intake to 4 glasses a day.  Next appointment: None, need to schedule AWV with PCP.   Preventive Care 81 Years and Older, Female Preventive care refers to lifestyle choices and visits with your health care provider that can promote health and wellness. What does preventive care include?  A yearly physical exam. This is also called an annual well check.  Dental exams once or twice a year.  Routine eye exams. Ask your health care provider how often you should have your eyes checked.  Personal lifestyle choices, including:  Daily care of your teeth and gums.  Regular physical activity.  Eating a healthy diet.  Avoiding tobacco and drug use.  Limiting alcohol use.  Practicing safe sex.  Taking low-dose aspirin every day.  Taking vitamin and mineral supplements as recommended by your health care provider. What happens during an annual well check? The services and screenings done by your health care provider during your annual well check will depend on your age, overall health, lifestyle risk factors, and family history of  disease. Counseling  Your health care provider may ask you questions about your:  Alcohol use.  Tobacco use.  Drug use.  Emotional well-being.  Home and relationship well-being.  Sexual activity.  Eating habits.  History of falls.  Memory and ability to understand (cognition).  Work and work Statistician.  Reproductive health. Screening  You may have the following tests or measurements:  Height, weight, and BMI.  Blood pressure.  Lipid and cholesterol levels. These may be checked every 5 years, or more frequently if you are over 89 years old.  Skin check.  Lung cancer screening. You may have this screening every year starting at age 52 if you have a 30-pack-year history of smoking and currently smoke or have quit within the past 15 years.  Fecal occult blood test (FOBT) of the stool. You may have this test every year starting at age 4.  Flexible sigmoidoscopy or colonoscopy. You may have a sigmoidoscopy every 5 years or a colonoscopy every 10 years starting at age 55.  Hepatitis C blood test.  Hepatitis B blood test.  Sexually transmitted disease (STD) testing.  Diabetes screening. This is done by checking your blood sugar (glucose) after you have not eaten for a while (fasting). You may have this done every 1-3 years.  Bone density scan. This is done to screen for osteoporosis. You may have this done starting at age 10.  Mammogram. This may be done every 1-2 years. Talk to your health care provider about how often you should have regular mammograms. Talk with your health care provider about your test results, treatment options, and if necessary, the need for more tests. Vaccines  Your health care provider may recommend certain vaccines, such as:  Influenza vaccine.  This is recommended every year.  Tetanus, diphtheria, and acellular pertussis (Tdap, Td) vaccine. You may need a Td booster every 10 years.  Zoster vaccine. You may need this after age  40.  Pneumococcal 13-valent conjugate (PCV13) vaccine. One dose is recommended after age 73.  Pneumococcal polysaccharide (PPSV23) vaccine. One dose is recommended after age 31. Talk to your health care provider about which screenings and vaccines you need and how often you need them. This information is not intended to replace advice given to you by your health care provider. Make sure you discuss any questions you have with your health care provider. Document Released: 11/15/2015 Document Revised: 07/08/2016 Document Reviewed: 08/20/2015 Elsevier Interactive Patient Education  2017 [place] Prevention in the Home Falls can cause injuries. They can happen to people of all ages. There are many things you can do to make your home safe and to help prevent falls. What can I do on the outside of my home?  Regularly fix the edges of walkways and driveways and fix any cracks.  Remove anything that might make you trip as you walk through a door, such as a raised step or threshold.  Trim any bushes or trees on the path to your home.  Use bright outdoor lighting.  Clear any walking paths of anything that might make someone trip, such as rocks or tools.  Regularly check to see if handrails are loose or broken. Make sure that both sides of any steps have handrails.  Any raised decks and porches should have guardrails on the edges.  Have any leaves, snow, or ice cleared regularly.  Use sand or salt on walking paths during winter.  Clean up any spills in your garage right away. This includes oil or grease spills. What can I do in the bathroom?  Use night lights.  Install grab bars by the toilet and in the tub and shower. Do not use towel bars as grab bars.  Use non-skid mats or decals in the tub or shower.  If you need to sit down in the shower, use a plastic, non-slip stool.  Keep the floor dry. Clean up any water that spills on the floor as soon as it happens.  Remove  soap buildup in the tub or shower regularly.  Attach bath mats securely with double-sided non-slip rug tape.  Do not have throw rugs and other things on the floor that can make you trip. What can I do in the bedroom?  Use night lights.  Make sure that you have a light by your bed that is easy to reach.  Do not use any sheets or blankets that are too big for your bed. They should not hang down onto the floor.  Have a firm chair that has side arms. You can use this for support while you get dressed.  Do not have throw rugs and other things on the floor that can make you trip. What can I do in the kitchen?  Clean up any spills right away.  Avoid walking on wet floors.  Keep items that you use a lot in easy-to-reach places.  If you need to reach something above you, use a strong step stool that has a grab bar.  Keep electrical cords out of the way.  Do not use floor polish or wax that makes floors slippery. If you must use wax, use non-skid floor wax.  Do not have throw rugs and other things on the floor that can make  you trip. What can I do with my stairs?  Do not leave any items on the stairs.  Make sure that there are handrails on both sides of the stairs and use them. Fix handrails that are broken or loose. Make sure that handrails are as long as the stairways.  Check any carpeting to make sure that it is firmly attached to the stairs. Fix any carpet that is loose or worn.  Avoid having throw rugs at the top or bottom of the stairs. If you do have throw rugs, attach them to the floor with carpet tape.  Make sure that you have a light switch at the top of the stairs and the bottom of the stairs. If you do not have them, ask someone to add them for you. What else can I do to help prevent falls?  Wear shoes that:  Do not have high heels.  Have rubber bottoms.  Are comfortable and fit you well.  Are closed at the toe. Do not wear sandals.  If you use a  stepladder:  Make sure that it is fully opened. Do not climb a closed stepladder.  Make sure that both sides of the stepladder are locked into place.  Ask someone to hold it for you, if possible.  Clearly mark and make sure that you can see:  Any grab bars or handrails.  First and last steps.  Where the edge of each step is.  Use tools that help you move around (mobility aids) if they are needed. These include:  Canes.  Walkers.  Scooters.  Crutches.  Turn on the lights when you go into a dark area. Replace any light bulbs as soon as they burn out.  Set up your furniture so you have a clear path. Avoid moving your furniture around.  If any of your floors are uneven, fix them.  If there are any pets around you, be aware of where they are.  Review your medicines with your doctor. Some medicines can make you feel dizzy. This can increase your chance of falling. Ask your doctor what other things that you can do to help prevent falls. This information is not intended to replace advice given to you by your health care provider. Make sure you discuss any questions you have with your health care provider. Document Released: 08/15/2009 Document Revised: 03/26/2016 Document Reviewed: 11/23/2014 Elsevier Interactive Patient Education  2017 [employer].

## 2017-03-12 ENCOUNTER — Telehealth: Payer: Self-pay | Admitting: Family Medicine

## 2017-03-12 NOTE — Telephone Encounter (Signed)
Error

## 2017-03-30 ENCOUNTER — Ambulatory Visit: Payer: Medicare HMO | Admitting: Family Medicine

## 2017-03-31 ENCOUNTER — Ambulatory Visit: Payer: Medicare HMO

## 2017-03-31 ENCOUNTER — Encounter: Payer: Medicare HMO | Admitting: Family Medicine

## 2017-04-02 ENCOUNTER — Encounter: Payer: Self-pay | Admitting: Family Medicine

## 2017-04-02 ENCOUNTER — Ambulatory Visit (INDEPENDENT_AMBULATORY_CARE_PROVIDER_SITE_OTHER): Payer: Medicare HMO | Admitting: Family Medicine

## 2017-04-02 VITALS — BP 120/60 | HR 73 | Temp 98.9°F | Resp 16 | Ht 67.0 in | Wt 191.0 lb

## 2017-04-02 DIAGNOSIS — E559 Vitamin D deficiency, unspecified: Secondary | ICD-10-CM | POA: Diagnosis not present

## 2017-04-02 DIAGNOSIS — E119 Type 2 diabetes mellitus without complications: Secondary | ICD-10-CM | POA: Diagnosis not present

## 2017-04-02 DIAGNOSIS — E7849 Other hyperlipidemia: Secondary | ICD-10-CM

## 2017-04-02 DIAGNOSIS — Z0000 Encounter for general adult medical examination without abnormal findings: Secondary | ICD-10-CM | POA: Diagnosis not present

## 2017-04-02 DIAGNOSIS — D649 Anemia, unspecified: Secondary | ICD-10-CM | POA: Diagnosis not present

## 2017-04-02 DIAGNOSIS — E784 Other hyperlipidemia: Secondary | ICD-10-CM

## 2017-04-02 NOTE — Progress Notes (Signed)
Patient: Briana Austin, Female    DOB: 1934/03/15, 83 y.o.   MRN: [id number] Visit Date: 04/02/2017  Today's Provider: Lelon Huh, MD   Chief Complaint  Patient presents with  . Annual Exam  . Diabetes  . Hypertension  . Hyperlipidemia   Subjective:    Annual physical Briana Austin is a 83 y.o. female. She feels well. She reports exercising none. She reports she is sleeping well.  -----------------------------------------------------------    Diabetes Mellitus Type II, Follow-up:   Lab Results  Component Value Date   HGBA1C 8.1 12/28/2016   HGBA1C 7.5 09/16/2016   HGBA1C 7.5 06/19/2016   Last seen for diabetes 3 months ago.  Management since then includes, no changes. She reports good compliance with treatment. She is not having side effects. none Current symptoms include none and have been unchanged. Home blood sugar records: fasting range: 80-120  Episodes of hypoglycemia? no   Current Insulin Regimen: n/a Most Recent Eye Exam: 02/24/2017 Weight trend: stable Prior visit with dietician: no Current diet: in general, an "unhealthy" diet Current exercise: none  ----------------------------------------------------------------    Hypertension, follow-up:  BP Readings from Last 3 Encounters:  04/02/17 120/60  03/09/17 130/66  12/28/16 128/62    She was last seen for hypertension 3 months ago.  BP at that visit was 108/64. Management since that visit includes; decreased Lopressor to 1/2 tablet qd.She reports good compliance with treatment. She is not having side effects. none She is not exercising. She is not adherent to low salt diet.   Outside blood pressures are none. She is experiencing none.  Patient denies none.   Cardiovascular risk factors include diabetes mellitus.  Use of agents associated with hypertension: none.   ----------------------------------------------------------------     Lipid/Cholesterol, Follow-up:   Last  seen for this 3 months ago.  Management since that visit includes; no changes.  Last Lipid Panel:    Component Value Date/Time   CHOL 177 02/20/2016 0914   CHOL 187 09/14/2014   TRIG 108 02/20/2016 0914   TRIG 132 09/14/2014   HDL 47 02/20/2016 0914   CHOLHDL 3.8 02/20/2016 0914   CHOLHDL 3.1 07/04/2015 1141   VLDL 12 07/04/2015 1141   LDLCALC 108 (H) 02/20/2016 0914    She reports good compliance with treatment. She is not having side effects. none  Wt Readings from Last 3 Encounters:  04/02/17 191 lb (86.6 kg)  03/09/17 193 lb (87.5 kg)  12/28/16 192 lb (87.1 kg)    ----------------------------------------------------------------     Review of Systems  Constitutional: Negative for activity change, appetite change and fatigue.  Eyes: Positive for discharge.  Respiratory: Negative for chest tightness and shortness of breath.   Cardiovascular: Negative for chest pain.  Gastrointestinal: Negative for abdominal pain and constipation.  Endocrine: Positive for cold intolerance and polyuria.  Genitourinary: Negative for difficulty urinating.  Musculoskeletal: Negative for arthralgias, back pain and gait problem.  Neurological: Negative for dizziness and light-headedness.  Psychiatric/Behavioral: Negative for agitation. The patient is nervous/anxious.     Social History   Social History  . Marital status: Married    Spouse name: N/A  . Number of children: 1  . Years of education: HS Grad   Occupational History  . Retired     [employer]   Social History Main Topics  . Smoking status: Former Smoker    Packs/day: 0.50    Years: 8.00    Types: Cigarettes  . Smokeless tobacco:  Never Used     Comment: > 50 years ago  . Alcohol use No  . Drug use: No  . Sexual activity: No   Other Topics Concern  . Not on file   Social History Narrative   Lives at home with her husband.   Right-handed.   1 cup caffeine daily.    Past Medical History:  Diagnosis  Date  . Breast cancer ([place]) 2005   positive, radiation  . Cancer [facility])    right breast 2005 with radiation therapy  . CAP (community acquired pneumonia) 06/14/2015  . Dementia   . Diabetes mellitus without complication ([place])   . History of breast cancer   . Hypertension   . Seizure [facility])      Patient Active Problem List   Diagnosis Date Noted  . Seizures ([place])   . Euthyroid sick syndrome   . 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 Dementia in Alzheimer's disease   . Essential hypertension   . Facial droop 07/03/2015  . TIA (transient ischemic attack)   . Diabetes mellitus, type II ([place]) 03/25/2015  . Hyperlipidemia 03/25/2015  . Normocytic anemia 03/25/2015  . Physical deconditioning 03/25/2015  . Vitamin D deficiency 11/17/2010    Past Surgical History:  Procedure Laterality Date  . BREAST BIOPSY Right    negative 2011  . BREAST EXCISIONAL BIOPSY Right    positive 2005  . BREAST LUMPECTOMY Right 2005   Carcinoma in situ of right breast  . sigmoid polyp excision  11/24/2004   Hyperplastic polyp  . TOTAL ABDOMINAL HYSTERECTOMY W/ BILATERAL SALPINGOOPHORECTOMY  1981   Benign    Her family history includes Dementia in her brother; Stroke in her mother.      Current Outpatient Prescriptions:  .  ACCU-CHEK SOFTCLIX LANCETS lancets, 1 each by Other route daily. Use as instructed , Disp: , Rfl:  .  aspirin EC 81 MG tablet, Take 81 mg by mouth daily., Disp: , Rfl:  .  Blood Glucose Monitoring Suppl (ACCU-CHEK AVIVA) device, Use as instructed, Disp: 1 each, Rfl: 0 .  calcium carbonate (OS-CAL) 600 MG TABS tablet, Take 1 tablet by mouth daily., Disp: , Rfl:  .  donepezil (ARICEPT) 5 MG tablet, TAKE ONE TABLET BY MOUTH AT BEDTIME, Disp: 90 tablet, Rfl: 3 .  feeding supplement, ENSURE ENLIVE, (ENSURE ENLIVE) LIQD, Take 237 mLs by mouth 2 (two) times daily between meals., Disp: 237 mL, Rfl: 12 .  glipiZIDE (GLUCOTROL XL) 5 MG 24 hr tablet, TAKE ONE TABLET BY MOUTH ONCE DAILY WITH  BREAKFAST, Disp: 90 tablet, Rfl: 3 .  glucose blood (ACCU-CHEK AVIVA PLUS) test strip, Use to check blood sugar once a day with Soft-clix Lancet. E11.9, Disp: 100 each, Rfl: 4 .  KLOR-CON M10 10 MEQ tablet, TAKE ONE TABLET BY MOUTH ONCE DAILY, Disp: 30 tablet, Rfl: 6 .  Lancet Devices (ACCU-CHEK SOFTCLIX) lancets, Use as instructed, Disp: 100 each, Rfl: 4 .  levETIRAcetam (KEPPRA) 500 MG tablet, Take 1 tablet (500 mg total) by mouth 2 (two) times daily., Disp: 60 tablet, Rfl: 11 .  lisinopril (PRINIVIL,ZESTRIL) 20 MG tablet, Take 1 tablet (20 mg total) by mouth daily., Disp: 90 tablet, Rfl: 3 .  metFORMIN (GLUCOPHAGE-XR) 500 MG 24 hr tablet, TAKE TWO TABLETS BY MOUTH ONCE DAILY (Patient taking differently: TAKE ONE TABLET BY MOUTH ONCE DAILY, only taking 1 a day), Disp: 60 tablet, Rfl: 12 .  metoprolol-hydrochlorothiazide (LOPRESSOR HCT) 50-25 MG tablet, Take 0.5 tablets by mouth daily., Disp: 30  tablet, Rfl: 3 .  NAMENDA XR 28 MG CP24 24 hr capsule, TAKE ONE CAPSULE BY MOUTH ONCE DAILY, Disp: 30 capsule, Rfl: 12 .  ONE TOUCH LANCETS MISC, 1 Device by Does not apply route daily., Disp: 100 each, Rfl: 4 .  pravastatin (PRAVACHOL) 40 MG tablet, Take 1 tablet (40 mg total) by mouth daily., Disp: 90 tablet, Rfl: 3 .  vitamin B-12 1000 MCG tablet, Take 1 tablet (1,000 mcg total) by mouth daily., Disp: 30 tablet, Rfl: 1 .  Vitamin D, Ergocalciferol, (DRISDOL) 50000 units CAPS capsule, TAKE ONE CAPSULE BY MOUTH ONCE A WEEK, Disp: 12 capsule, Rfl: 4  Patient Care Team: Birdie Sons, MD as PCP - General (Family Medicine) Dingeldein, Remo Lipps, MD as Consulting Physician (Ophthalmology) Naoma Diener, MD as Referring Physician (Neurology)     Objective:   Vitals: BP 120/60 (BP Location: Right Arm, Patient Position: Sitting, Cuff Size: Large)   Pulse 73   Temp 98.9 F (37.2 C) (Oral)   Resp 16   Ht 5\' 7"  (1.702 m)   Wt 191 lb (86.6 kg)   SpO2 94%   BMI 29.91 kg/m   Physical Exam  Activities  of Daily Living In your present state of health, do you have any difficulty performing the following activities: 04/02/2017 03/09/2017  Hearing? N N  Vision? N N  Difficulty concentrating or making decisions? Tempie Donning  Walking or climbing stairs? Y Y  Dressing or bathing? N N  Doing errands, shopping? Tempie Donning  Preparing Food and eating ? - Y  Using the Toilet? - N  In the past six months, have you accidently leaked urine? - Y  Do you have problems with loss of bowel control? - N  Managing your Medications? - Y  Managing your Finances? - Y  Housekeeping or managing your Housekeeping? - Y  Some recent data might be hidden    Fall Risk Assessment Fall Risk  04/02/2017 03/09/2017 10/18/2015  Falls in the past year? No Yes No  Number falls in past yr: - 1 -  Follow up - Falls prevention discussed -     Depression Screen PHQ 2/9 Scores 04/02/2017 03/09/2017 12/28/2016 10/18/2015  PHQ - 2 Score 3 3 2 3   PHQ- 9 Score 13 8 8 16     Cognitive Testing - 6-CIT n/a  Question Answer Points  How often do you have alcoholic drink? never 0  On days you do drink alcohol, how many drinks do you typically consume? 0 0  How oftey will you drink 6 or more in a total? never 0  Total Score:  0   A score of 3 or more in women, and 4 or more in men indicates increased risk for alcohol abuse, EXCEPT if all of the points are from question 1.     Assessment & Plan:    Annual Physical Reviewed patient's Family Medical History Reviewed and updated list of patient's medical providers Assessment of cognitive impairment was done Assessed patient's functional ability Established a written schedule for health screening [place] Completed and Reviewed  Exercise Activities and Dietary recommendations Goals    . Increase water intake          Recommend increasing water intake to 4 glasses of water every day.       Immunization History  Administered Date(s) Administered  . Influenza, High  Dose Seasonal PF 07/26/2014, 08/13/2015, 08/22/2016  . Pneumococcal Conjugate-13 04/11/2014  . Pneumococcal Polysaccharide-23 09/21/2002  Health Maintenance  Topic Date Due  . FOOT EXAM  01/22/1946  . TETANUS/TDAP  11/02/2026 (Originally 01/23/1953)  . INFLUENZA VACCINE  06/02/2017  . HEMOGLOBIN A1C  06/27/2017  . OPHTHALMOLOGY EXAM  02/24/2018  . DEXA SCAN  Completed  . PNA vac Low Risk Adult  Completed     Discussed health benefits of physical activity, and encouraged her to engage in regular exercise appropriate for her age and condition.    --------------------------------------------------------------------------  1. Annual physical exam   2. Type 2 diabetes mellitus without complication, without long-term current use of insulin (HCC)  - Hemoglobin A1c  3. Other hyperlipidemia She is tolerating pravastatin well with no adverse effects.   - Comprehensive metabolic panel - Lipid panel  4. Normocytic anemia  - CBC  5. Vitamin D deficiency  - VITAMIN D 25 Hydroxy (Vit-D Deficiency, Fractures)     Lelon Huh, MD  [facility]

## 2017-04-03 LAB — COMPREHENSIVE METABOLIC PANEL
ALT: 19 IU/L (ref 0–32)
AST: 17 IU/L (ref 0–40)
Albumin/Globulin Ratio: 1.1 — ABNORMAL LOW (ref 1.2–2.2)
Albumin: 4 g/dL (ref 3.5–4.7)
Alkaline Phosphatase: 63 IU/L (ref 39–117)
BILIRUBIN TOTAL: 0.3 mg/dL (ref 0.0–1.2)
BUN/Creatinine Ratio: 11 — ABNORMAL LOW (ref 12–28)
BUN: 14 mg/dL (ref 8–27)
CHLORIDE: 99 mmol/L (ref 96–106)
CO2: 24 mmol/L (ref 18–29)
Calcium: 10.5 mg/dL — ABNORMAL HIGH (ref 8.7–10.3)
Creatinine, Ser: 1.26 mg/dL — ABNORMAL HIGH (ref 0.57–1.00)
GFR calc non Af Amer: 40 mL/min/{1.73_m2} — ABNORMAL LOW (ref >59)
GFR, EST AFRICAN AMERICAN: 46 mL/min/{1.73_m2} — AB (ref >59)
GLUCOSE: 145 mg/dL — AB (ref 65–99)
Globulin, Total: 3.6 g/dL (ref 1.5–4.5)
Potassium: 4.5 mmol/L (ref 3.5–5.2)
Sodium: 138 mmol/L (ref 134–144)
TOTAL PROTEIN: 7.6 g/dL (ref 6.0–8.5)

## 2017-04-03 LAB — HEMOGLOBIN A1C
Est. average glucose Bld gHb Est-mCnc: 192 mg/dL
Hgb A1c MFr Bld: 8.3 % — ABNORMAL HIGH (ref 4.8–5.6)

## 2017-04-03 LAB — CBC
HEMATOCRIT: 32.3 % — AB (ref 34.0–46.6)
HEMOGLOBIN: 11 g/dL — AB (ref 11.1–15.9)
MCH: 32.2 pg (ref 26.6–33.0)
MCHC: 34.1 g/dL (ref 31.5–35.7)
MCV: 94 fL (ref 79–97)
Platelets: 241 10*3/uL (ref 150–379)
RBC: 3.42 x10E6/uL — AB (ref 3.77–5.28)
RDW: 14 % (ref 12.3–15.4)
WBC: 9.7 10*3/uL (ref 3.4–10.8)

## 2017-04-03 LAB — VITAMIN D 25 HYDROXY (VIT D DEFICIENCY, FRACTURES): Vit D, 25-Hydroxy: 54.2 ng/mL (ref 30.0–100.0)

## 2017-04-03 LAB — LIPID PANEL
CHOL/HDL RATIO: 3.9 ratio (ref 0.0–4.4)
Cholesterol, Total: 163 mg/dL (ref 100–199)
HDL: 42 mg/dL (ref >39)
LDL CALC: 96 mg/dL (ref 0–99)
TRIGLYCERIDES: 126 mg/dL (ref 0–149)
VLDL Cholesterol Cal: 25 mg/dL (ref 5–40)

## 2017-04-09 ENCOUNTER — Other Ambulatory Visit: Payer: Self-pay | Admitting: Nurse Practitioner

## 2017-04-16 ENCOUNTER — Other Ambulatory Visit: Payer: Self-pay | Admitting: Family Medicine

## 2017-04-30 ENCOUNTER — Other Ambulatory Visit: Payer: Self-pay | Admitting: Family Medicine

## 2017-04-30 NOTE — Telephone Encounter (Signed)
Please review-aa 

## 2017-05-04 ENCOUNTER — Other Ambulatory Visit: Payer: Self-pay | Admitting: Family Medicine

## 2017-05-04 DIAGNOSIS — I10 Essential (primary) hypertension: Secondary | ICD-10-CM

## 2017-05-10 ENCOUNTER — Ambulatory Visit: Payer: Commercial Managed Care - HMO | Admitting: Neurology

## 2017-06-02 ENCOUNTER — Encounter: Payer: Self-pay | Admitting: Neurology

## 2017-06-02 ENCOUNTER — Ambulatory Visit (INDEPENDENT_AMBULATORY_CARE_PROVIDER_SITE_OTHER): Payer: Medicare HMO | Admitting: Neurology

## 2017-06-02 VITALS — BP 124/62 | HR 73 | Ht 67.0 in | Wt 190.5 lb

## 2017-06-02 DIAGNOSIS — F0280 Dementia in other diseases classified elsewhere without behavioral disturbance: Secondary | ICD-10-CM | POA: Diagnosis not present

## 2017-06-02 DIAGNOSIS — G309 Alzheimer's disease, unspecified: Secondary | ICD-10-CM

## 2017-06-02 DIAGNOSIS — R569 Unspecified convulsions: Secondary | ICD-10-CM | POA: Diagnosis not present

## 2017-06-02 MED ORDER — MEMANTINE HCL-DONEPEZIL HCL ER 28-10 MG PO CP24: 1.0000 | ORAL_CAPSULE | Freq: Every day | ORAL | 11 refills | Status: DC

## 2017-06-02 MED ORDER — LEVETIRACETAM 500 MG PO TABS: 500.0000 mg | ORAL_TABLET | Freq: Two times a day (BID) | ORAL | 4 refills | Status: DC

## 2017-06-02 NOTE — Progress Notes (Signed)
**  Bupivacaine 0.25%, [place] 18335-825-18, Lot [id number], Exp 12/2020, office supply.//mck,rn**

## 2017-06-02 NOTE — Progress Notes (Signed)
[facility]  PATIENT: Briana Austin DOB: 1934/04/26  HISTORY OF PRESENT ILLNESS: Briana Austin is a 83 yo RH female, seen in refer by Dr. Birdie Sons for evaluation of seizure. She has past medical history of dementia, diabetes, hypertension, hyperlipidemia, had one seizure August 31st 2016, was weakness by her husband, she had sudden onset right hand posturing, forceful head turning to the left side, body jerking, she was taken by ambulance to the emergency room, in August 30 first 7 AM, she had another recurrent episodes, similar presentation at [facility].  She was loaded with Keppra 500 mg twice a day, family noticed increased drowsiness, no recurrent seizure. At baseline, she is forgetful, needing help bathing, still able to carry on a conversation, she retired from Psychologist, educational job, ambulate with a walker. There was no agitation, mild gait difficulty, no bowel bladder incontinence. I have reviewed MRI of the brain without contrast July 03 2015:No acute intracranial abnormality. Remote lacunar infarct of the right cerebellum. Normal variant MRA circle of Willis without significant proximal stenosis, aneurysm, or branch vessel occlusion. EEG September second 2016: Mild diffuse slowing of the background. Focal slowing over the left temporal region  UPDATE June 02 2017: She is accompanied by her husband and daughter at today's clinical visit, overall slow decline of her memory loss, taking Namenda XR 28 mg plus donepezil 5 mg, tolerating the medication well, she has no recurrent seizure, taking Keppra 500 mg twice a day   REVIEW OF SYSTEMS: Full 14 system review of systems performed and notable only for those listed, all others are neg: Trouble swallowing, daytime sleepiness, snoring, sleep talking, incontinent of bladder, frequent urinations, memory loss, confusion, nervousness and anxiety   ALLERGIES: No Known Allergies  HOME MEDICATIONS: Outpatient  Medications Prior to Visit  Medication Sig Dispense Refill  . ACCU-CHEK SOFTCLIX LANCETS lancets 1 each by Other route daily. Use as instructed     . aspirin EC 81 MG tablet Take 81 mg by mouth daily.    . Blood Glucose Monitoring Suppl (ACCU-CHEK AVIVA) device Use as instructed 1 each 0  . calcium carbonate (OS-CAL) 600 MG TABS tablet Take 1 tablet by mouth daily.    Marland Kitchen donepezil (ARICEPT) 5 MG tablet TAKE ONE TABLET BY MOUTH AT BEDTIME 90 tablet 3  . feeding supplement, ENSURE ENLIVE, (ENSURE ENLIVE) LIQD Take 237 mLs by mouth 2 (two) times daily between meals. 237 mL 12  . glipiZIDE (GLUCOTROL XL) 5 MG 24 hr tablet TAKE ONE TABLET BY MOUTH ONCE DAILY WITH BREAKFAST 90 tablet 3  . glucose blood (ACCU-CHEK AVIVA PLUS) test strip Use to check blood sugar once a day with Soft-clix Lancet. E11.9 100 each 4  . KLOR-CON M10 10 MEQ tablet TAKE ONE TABLET BY MOUTH ONCE DAILY 30 tablet 6  . Lancet Devices (ACCU-CHEK SOFTCLIX) lancets Use as instructed 100 each 4  . levETIRAcetam (KEPPRA) 500 MG tablet TAKE ONE TABLET BY MOUTH TWICE DAILY 60 tablet 1  . lisinopril (PRINIVIL,ZESTRIL) 20 MG tablet Take 1 tablet (20 mg total) by mouth daily. 90 tablet 3  . memantine (NAMENDA XR) 28 MG CP24 24 hr capsule TAKE ONE CAPSULE BY MOUTH ONCE DAILY 90 capsule 1  . metFORMIN (GLUCOPHAGE-XR) 500 MG 24 hr tablet TAKE TWO TABLETS BY MOUTH ONCE DAILY 60 tablet 12  . metoprolol-hydrochlorothiazide (LOPRESSOR HCT) 50-25 MG tablet TAKE ONE-HALF TABLET BY MOUTH ONCE DAILY 90 tablet 4  . ONE TOUCH LANCETS MISC 1 Device by Does not  apply route daily. 100 each 4  . pravastatin (PRAVACHOL) 40 MG tablet Take 1 tablet (40 mg total) by mouth daily. 90 tablet 3  . vitamin B-12 1000 MCG tablet Take 1 tablet (1,000 mcg total) by mouth daily. 30 tablet 1  . Vitamin D, Ergocalciferol, (DRISDOL) 50000 units CAPS capsule TAKE ONE CAPSULE BY MOUTH ONCE A WEEK 12 capsule 4   No facility-administered medications prior to visit.     PAST  MEDICAL HISTORY: Past Medical History:  Diagnosis Date  . Breast cancer ([place]) 2005   positive, radiation  . Cancer [facility])    right breast 2005 with radiation therapy  . CAP (community acquired pneumonia) 06/14/2015  . Dementia   . Diabetes mellitus without complication ([place])   . History of breast cancer   . Hypertension   . Seizure ([place])     PAST SURGICAL HISTORY: Past Surgical History:  Procedure Laterality Date  . BREAST BIOPSY Right    negative 2011  . BREAST EXCISIONAL BIOPSY Right    positive 2005  . BREAST LUMPECTOMY Right 2005   Carcinoma in situ of right breast  . sigmoid polyp excision  11/24/2004   Hyperplastic polyp  . TOTAL ABDOMINAL HYSTERECTOMY W/ BILATERAL SALPINGOOPHORECTOMY  1981   Benign    FAMILY HISTORY: Family History  Problem Relation Age of Onset  . Stroke Mother   . Dementia Brother     SOCIAL HISTORY: Social History   Social History  . Marital status: Married    Spouse name: N/A  . Number of children: 1  . Years of education: HS Grad   Occupational History  . Retired     [employer]   Social History Main Topics  . Smoking status: Former Smoker    Packs/day: 0.50    Years: 8.00    Types: Cigarettes  . Smokeless tobacco: Never Used     Comment: > 50 years ago  . Alcohol use No  . Drug use: No  . Sexual activity: No   Other Topics Concern  . Not on file   Social History Narrative   Lives at home with her husband.   Right-handed.   1 cup caffeine daily.     PHYSICAL EXAM  Vitals:   06/02/17 1120  BP: 124/62  Pulse: 73  Weight: 190 lb 8 oz (86.4 kg)  Height: 5\' 7"  (1.702 m)   Body mass index is 29.84 kg/m. Gen: NAD, conversant, well nourised, obese, well groomed  Cardiovascular: Regular rate rhythm,  Neck: Supple, no carotid bruit. NEUROLOGICAL EXAM: MMSE - Mini Mental State Exam 05/06/2016 10/29/2015  Orientation to time 0 2  Orientation to Place 3 3  Registration 3 3  Attention/  Calculation 3 3  Recall 0 0  Language- name 2 objects 2 2  Language- repeat 1 0  Language- follow 3 step command 3 2  Language- read & follow direction 1 1  Write a sentence 1 1  Copy design 1 0  Total score 18 17   CRANIAL NERVES: CN II: Visual fields are full to confrontation. Pupils are round equal and briskly reactive to light. CN III, IV, VI: extraocular movement are normal. No ptosis. CN V: Facial sensation is intact to pinprick in all 3 divisions bilaterally. .  CN VII: Face is symmetric with normal eye closure and smile. CN VIII: Hearing is normal to rubbing fingers CN IX, X: Palate elevates symmetrically. Phonation is normal. CN XI: Head turning and shoulder shrug are intact  CN XII: Tongue is midline with normal movements and no atrophy. MOTOR: There is no pronator drift of out-stretched arms. Muscle bulk and tone are normal. Muscle strength is normal. REFLEXES: Reflexes are 2+ and symmetric at the biceps, triceps, knees, and ankles. Plantar responses are flexor. COORDINATION: Rapid alternating movements and fine finger movements are intact. There is no dysmetria on finger-to-nose and heel-knee-shin.  GAIT/STANCE: Slow mildly unsteady gait, no assistive device DIAGNOSTIC DATA (LABS, IMAGING, TESTING) - I reviewed patient records, labs, notes, testing and imaging myself where available.  Lab Results  Component Value Date   WBC 9.7 04/02/2017   HGB 11.0 (L) 04/02/2017   HCT 32.3 (L) 04/02/2017   MCV 94 04/02/2017   PLT 241 04/02/2017      Component Value Date/Time   NA 138 04/02/2017 0956   NA 139 09/14/2014   K 4.5 04/02/2017 0956   K 4.4 09/14/2014   CL 99 04/02/2017 0956   CL 100 01/25/2014 1040   CO2 24 04/02/2017 0956   CO2 24 01/25/2014 1040   GLUCOSE 145 (H) 04/02/2017 0956   GLUCOSE 114 (H) 07/05/2015 0432   GLUCOSE 229 (H) 01/25/2014 1040   BUN 14 04/02/2017 0956   BUN 20 09/14/2014   CREATININE 1.26 (H) 04/02/2017 0956   CREATININE 1.33  09/14/2014   CALCIUM 10.5 (H) 04/02/2017 0956   CALCIUM 10.0 01/25/2014 1040   PROT 7.6 04/02/2017 0956   PROT 8.3 (H) 01/25/2014 1040   ALBUMIN 4.0 04/02/2017 0956   ALBUMIN 3.6 01/25/2014 1040   AST 17 04/02/2017 0956   AST 22 01/25/2014 1040   ALT 19 04/02/2017 0956   ALT 26 01/25/2014 1040   ALKPHOS 63 04/02/2017 0956   ALKPHOS 60 01/25/2014 1040   BILITOT 0.3 04/02/2017 0956   BILITOT 0.3 01/25/2014 1040   GFRNONAA 40 (L) 04/02/2017 0956   GFRNONAA 30 (L) 01/25/2014 1040   GFRAA 46 (L) 04/02/2017 0956   GFRAA 34 (L) 01/25/2014 1040   Lab Results  Component Value Date   CHOL 163 04/02/2017   HDL 42 04/02/2017   LDLCALC 96 04/02/2017   TRIG 126 04/02/2017   CHOLHDL 3.9 04/02/2017   Lab Results  Component Value Date   HGBA1C 8.3 (H) 04/02/2017    Lab Results  Component Value Date   TSH 1.840 02/20/2016    ASSESSMENT AND PLAN 83 y.o. year old female Dementia:  Slow worsening memory loss, will change to Namzaric 28-10mg  daily Seizure:  Keep keppra 500mg  bid   Marcial Pacas, M.D. Ph.D.  [facility] [place],  [address] Phone: [phone] Fax:      [phone]

## 2017-07-09 ENCOUNTER — Encounter: Payer: Self-pay | Admitting: Family Medicine

## 2017-07-09 ENCOUNTER — Ambulatory Visit (INDEPENDENT_AMBULATORY_CARE_PROVIDER_SITE_OTHER): Payer: Medicare HMO | Admitting: Family Medicine

## 2017-07-09 VITALS — BP 110/60 | HR 77 | Temp 98.4°F | Resp 16 | Ht 67.0 in | Wt 191.0 lb

## 2017-07-09 DIAGNOSIS — I10 Essential (primary) hypertension: Secondary | ICD-10-CM | POA: Diagnosis not present

## 2017-07-09 DIAGNOSIS — J301 Allergic rhinitis due to pollen: Secondary | ICD-10-CM | POA: Diagnosis not present

## 2017-07-09 DIAGNOSIS — Z23 Encounter for immunization: Secondary | ICD-10-CM

## 2017-07-09 DIAGNOSIS — E119 Type 2 diabetes mellitus without complications: Secondary | ICD-10-CM | POA: Diagnosis not present

## 2017-07-09 LAB — POCT GLYCOSYLATED HEMOGLOBIN (HGB A1C)
Est. average glucose Bld gHb Est-mCnc: 177
HEMOGLOBIN A1C: 7.8

## 2017-07-09 NOTE — Progress Notes (Signed)
Patient: Briana Austin Female    DOB: 1934/09/06   83 y.o.   MRN: [id number] Visit Date: 07/09/2017  Today's Provider: Lelon Huh, MD   Chief Complaint  Patient presents with  . Follow-up  . Diabetes  . Hypertension  . Hyperlipidemia   Subjective:    HPI   Diabetes Mellitus Type II, Follow-up:   Lab Results  Component Value Date   HGBA1C 8.3 (H) 04/02/2017   HGBA1C 8.1 12/28/2016   HGBA1C 7.5 09/16/2016   Last seen for diabetes 3 months ago.  Management since then includes; recommended she cut back on sweets and starchy foods. She reports good compliance with treatment. She is not having side effects. none Current symptoms include none and have been unchanged. Home blood sugar records: fasting range: 86-124  Episodes of hypoglycemia? no   Current Insulin Regimen: n/a  Most Recent Eye Exam: 02/24/17 Weight trend: stable Prior visit with dietician: no Current diet: well balanced Current exercise: none  ----------------------------------------------------------------    Hypertension, follow-up:  BP Readings from Last 3 Encounters:  07/09/17 110/60  06/02/17 124/62  04/02/17 120/60    She was last seen for hypertension 6 months ago.  BP at that visit was 108/64. Management since that visit includes; decreased lopressor 6 months ago to 1/2 tablet qd.She reports good compliance with treatment. She is not having side effects. none She is not exercising. She is adherent to low salt diet.   Outside blood pressures are not checking. She is experiencing none.  Patient denies none.   Cardiovascular risk factors include diabetes mellitus.  Use of agents associated with hypertension: none.   ----------------------------------------------------------------     Lipid/Cholesterol, Follow-up:   Last seen for this 3 months ago.  Management since that visit includes, labs checked, no changes.  Last Lipid Panel:    Component Value Date/Time   CHOL  163 04/02/2017 0956   CHOL 187 09/14/2014   TRIG 126 04/02/2017 0956   TRIG 132 09/14/2014   HDL 42 04/02/2017 0956   CHOLHDL 3.9 04/02/2017 0956   CHOLHDL 3.1 07/04/2015 1141   VLDL 12 07/04/2015 1141   LDLCALC 96 04/02/2017 0956    She reports good compliance with treatment. She is not having side effects. none  Wt Readings from Last 3 Encounters:  07/09/17 191 lb (86.6 kg)  06/02/17 190 lb 8 oz (86.4 kg)  04/02/17 191 lb (86.6 kg)    ----------------------------------------------------------------  Normocytic anemia From 04/02/2017-labs checked, no changes.   Vitamin D deficiency From 04/02/2017-labs checked, no changes.   No Known Allergies   Current Outpatient Prescriptions:  .  ACCU-CHEK SOFTCLIX LANCETS lancets, 1 each by Other route daily. Use as instructed , Disp: , Rfl:  .  aspirin EC 81 MG tablet, Take 81 mg by mouth daily., Disp: , Rfl:  .  calcium carbonate (OS-CAL) 600 MG TABS tablet, Take 1 tablet by mouth daily., Disp: , Rfl:  .  donepezil (ARICEPT) 5 MG tablet, TAKE ONE TABLET BY MOUTH AT BEDTIME, Disp: 90 tablet, Rfl: 3 .  feeding supplement, ENSURE ENLIVE, (ENSURE ENLIVE) LIQD, Take 237 mLs by mouth 2 (two) times daily between meals., Disp: 237 mL, Rfl: 12 .  glipiZIDE (GLUCOTROL XL) 5 MG 24 hr tablet, TAKE ONE TABLET BY MOUTH ONCE DAILY WITH BREAKFAST, Disp: 90 tablet, Rfl: 3 .  glucose blood (ACCU-CHEK AVIVA PLUS) test strip, Use to check blood sugar once a day with Soft-clix Lancet. E11.9, Disp: 100 each, Rfl:  4 .  KLOR-CON M10 10 MEQ tablet, TAKE ONE TABLET BY MOUTH ONCE DAILY, Disp: 30 tablet, Rfl: 6 .  Lancet Devices (ACCU-CHEK SOFTCLIX) lancets, Use as instructed, Disp: 100 each, Rfl: 4 .  levETIRAcetam (KEPPRA) 500 MG tablet, Take 1 tablet (500 mg total) by mouth 2 (two) times daily., Disp: 180 tablet, Rfl: 4 .  lisinopril (PRINIVIL,ZESTRIL) 20 MG tablet, Take 1 tablet (20 mg total) by mouth daily., Disp: 90 tablet, Rfl: 3 .  memantine (NAMENDA XR)  28 MG CP24 24 hr capsule, TAKE ONE CAPSULE BY MOUTH ONCE DAILY, Disp: 90 capsule, Rfl: 1 .  Memantine HCl-Donepezil HCl (NAMZARIC) 28-10 MG CP24, Take 1 capsule by mouth daily., Disp: 30 capsule, Rfl: 11 (NOT STARTED YET) .  metFORMIN (GLUCOPHAGE-XR) 500 MG 24 hr tablet, TAKE TWO TABLETS BY MOUTH ONCE DAILY, Disp: 60 tablet, Rfl: 12 .  metoprolol-hydrochlorothiazide (LOPRESSOR HCT) 50-25 MG tablet, TAKE ONE-HALF TABLET BY MOUTH ONCE DAILY, Disp: 90 tablet, Rfl: 4 .  ONE TOUCH LANCETS MISC, 1 Device by Does not apply route daily., Disp: 100 each, Rfl: 4 .  pravastatin (PRAVACHOL) 40 MG tablet, Take 1 tablet (40 mg total) by mouth daily., Disp: 90 tablet, Rfl: 3 .  vitamin B-12 1000 MCG tablet, Take 1 tablet (1,000 mcg total) by mouth daily., Disp: 30 tablet, Rfl: 1 .  Vitamin D, Ergocalciferol, (DRISDOL) 50000 units CAPS capsule, TAKE ONE CAPSULE BY MOUTH ONCE A WEEK, Disp: 12 capsule, Rfl: 4  Review of Systems  Constitutional: Negative for appetite change, chills, fatigue and fever.  Respiratory: Negative for chest tightness and shortness of breath.   Cardiovascular: Negative for chest pain and palpitations.  Gastrointestinal: Negative for abdominal pain, nausea and vomiting.  Neurological: Negative for dizziness and weakness.    Social History  Substance Use Topics  . Smoking status: Former Smoker    Packs/day: 0.50    Years: 8.00    Types: Cigarettes  . Smokeless tobacco: Never Used     Comment: > 50 years ago  . Alcohol use No   Objective:   BP 110/60 (BP Location: Right Arm, Patient Position: Sitting, Cuff Size: Large)   Pulse 77   Temp 98.4 F (36.9 C) (Oral)   Resp 16   Ht 5\' 7"  (1.702 m)   Wt 191 lb (86.6 kg)   SpO2 98%   BMI 29.91 kg/m  Vitals:   07/09/17 1455  BP: 110/60  Pulse: 77  Resp: 16  Temp: 98.4 F (36.9 C)  TempSrc: Oral  SpO2: 98%  Weight: 191 lb (86.6 kg)  Height: 5\' 7"  (1.702 m)   Depression screen [facility] 2/9 07/09/2017 04/02/2017 03/09/2017 12/28/2016  10/18/2015  Decreased Interest 2 3 2 2 3   Down, Depressed, Hopeless 0 0 1 0 0  PHQ - 2 Score 2 3 3 2 3   Altered sleeping 0 3 0 0 3  Tired, decreased energy 2 3 1 2 3   Change in appetite 0 1 0 0 1  Feeling bad or failure about yourself  0 0 0 2 0  Trouble concentrating 3 3 3  0 3  Moving slowly or fidgety/restless 1 0 1 2 3   Suicidal thoughts 0 0 0 0 0  PHQ-9 Score 8 13 8 8 16   Difficult doing work/chores Not difficult at all Extremely dIfficult Somewhat difficult - Very difficult     Physical Exam   General Appearance:    Alert, cooperative, no distress  Eyes:    PERRL, conjunctiva/corneas clear, EOM's intact  Lungs:     Clear to auscultation bilaterally, respirations unlabored  Heart:    Regular rate and rhythm  Neurologic:   Awake, alert, oriented x 1. No apparent focal neurological           defect.        Results for orders placed or performed in visit on 07/09/17  POCT glycosylated hemoglobin (Hb A1C)  Result Value Ref Range   Hemoglobin A1C 7.8    Est. average glucose Bld gHb Est-mCnc 177        Assessment & Plan:     1. Type 2 diabetes mellitus without complication, without long-term current use of insulin (HCC) Stable. Continue current medications.   - POCT glycosylated hemoglobin (Hb A1C)  2. Essential hypertension Well controlled.  Continue current medications.    3. Allergic rhinitis due to pollen, unspecified seasonality Reports a lot of mucous in throat, advised can try OTC loratadine or fexofenadine  4. Need for influenza vaccination  - Flu vaccine HIGH DOSE PF       Lelon Huh, MD  [facility]

## 2017-07-09 NOTE — Patient Instructions (Addendum)
   Try OTC Claritin (loratadine) for sinus drainage  Medications Discontinued During This Encounter  Medication Reason  . metoprolol-hydrochlorothiazide (LOPRESSOR HCT) 50-25 MG tablet

## 2017-08-23 DIAGNOSIS — H25013 Cortical age-related cataract, bilateral: Secondary | ICD-10-CM | POA: Diagnosis not present

## 2017-08-26 ENCOUNTER — Other Ambulatory Visit: Payer: Self-pay | Admitting: Family Medicine

## 2017-10-08 ENCOUNTER — Encounter: Payer: Self-pay | Admitting: Family Medicine

## 2017-10-08 ENCOUNTER — Ambulatory Visit: Payer: Medicare HMO | Admitting: Family Medicine

## 2017-10-08 VITALS — BP 120/70 | HR 78 | Temp 98.8°F | Resp 16 | Ht 67.0 in | Wt 187.0 lb

## 2017-10-08 DIAGNOSIS — F0280 Dementia in other diseases classified elsewhere without behavioral disturbance: Secondary | ICD-10-CM | POA: Diagnosis not present

## 2017-10-08 DIAGNOSIS — I10 Essential (primary) hypertension: Secondary | ICD-10-CM | POA: Diagnosis not present

## 2017-10-08 DIAGNOSIS — G309 Alzheimer's disease, unspecified: Secondary | ICD-10-CM

## 2017-10-08 DIAGNOSIS — E119 Type 2 diabetes mellitus without complications: Secondary | ICD-10-CM

## 2017-10-08 LAB — POCT GLYCOSYLATED HEMOGLOBIN (HGB A1C)
ESTIMATED AVERAGE GLUCOSE: 157
Hemoglobin A1C: 7.1

## 2017-10-08 LAB — POCT UA - MICROALBUMIN: Microalbumin Ur, POC: 50 mg/L

## 2017-10-08 NOTE — Progress Notes (Signed)
oct      Patient: Briana Austin Female    DOB: 1934-10-01   83 y.o.   MRN: [id number] Visit Date: 10/08/2017  Today's Provider: Lelon Huh, MD   Chief Complaint  Patient presents with  . Follow-up  . Diabetes  . Hypertension   Subjective:    HPI   Diabetes Mellitus Type II, Follow-up:   Lab Results  Component Value Date   HGBA1C 7.8 07/09/2017   HGBA1C 8.3 (H) 04/02/2017   HGBA1C 8.1 12/28/2016   Last seen for diabetes 3 months ago.  Management since then includes; no changes. She reports good compliance with treatment. She is not having side effects. none Current symptoms include none and have been unchanged. Home blood sugar records: fasting range: <115  Episodes of hypoglycemia? no   Current Insulin Regimen: n/a Most Recent Eye Exam: 02/24/2017 Weight trend: decreasing steadily Prior visit with dietician: no Current diet: well balanced Current exercise: none  ----------------------------------------------------------------    Hypertension, follow-up:  BP Readings from Last 3 Encounters:  10/08/17 120/70  07/09/17 110/60  06/02/17 124/62    She was last seen for hypertension 3 months ago.  BP at that visit was 110/60. Management since that visit includes; no changes.She reports good compliance with treatment. She is not having side effects. none She is not exercising. She is not adherent to low salt diet.   Outside blood pressures are not checking. She is experiencing none.  Patient denies none.   Cardiovascular risk factors include diabetes mellitus.  Use of agents associated with hypertension: none.   ----------------------------------------------------------------      No Known Allergies   Current Outpatient Medications:  .  ACCU-CHEK SOFTCLIX LANCETS lancets, 1 each by Other route daily. Use as instructed , Disp: , Rfl:  .  aspirin EC 81 MG tablet, Take 81 mg by mouth daily., Disp: , Rfl:  .  calcium carbonate (OS-CAL) 600 MG  TABS tablet, Take 1 tablet by mouth daily., Disp: , Rfl:  .  feeding supplement, ENSURE ENLIVE, (ENSURE ENLIVE) LIQD, Take 237 mLs by mouth 2 (two) times daily between meals., Disp: 237 mL, Rfl: 12 .  glipiZIDE (GLUCOTROL XL) 5 MG 24 hr tablet, TAKE ONE TABLET BY MOUTH ONCE DAILY WITH BREAKFAST, Disp: 90 tablet, Rfl: 3 .  glucose blood (ACCU-CHEK AVIVA PLUS) test strip, Use to check blood sugar once a day with Soft-clix Lancet. E11.9, Disp: 100 each, Rfl: 4 .  KLOR-CON M10 10 MEQ tablet, TAKE 1 TABLET BY MOUTH ONCE DAILY, Disp: 30 tablet, Rfl: 12 .  Lancet Devices (ACCU-CHEK SOFTCLIX) lancets, Use as instructed, Disp: 100 each, Rfl: 4 .  levETIRAcetam (KEPPRA) 500 MG tablet, Take 1 tablet (500 mg total) by mouth 2 (two) times daily., Disp: 180 tablet, Rfl: 4 .  lisinopril (PRINIVIL,ZESTRIL) 20 MG tablet, Take 1 tablet (20 mg total) by mouth daily., Disp: 90 tablet, Rfl: 3 .  Memantine HCl-Donepezil HCl (NAMZARIC) 28-10 MG CP24, Take 1 capsule by mouth daily., Disp: 30 capsule, Rfl: 11 .  metFORMIN (GLUCOPHAGE-XR) 500 MG 24 hr tablet, TAKE TWO TABLETS BY MOUTH ONCE DAILY, Disp: 60 tablet, Rfl: 12 .  ONE TOUCH LANCETS MISC, 1 Device by Does not apply route daily., Disp: 100 each, Rfl: 4 .  pravastatin (PRAVACHOL) 40 MG tablet, Take 1 tablet (40 mg total) by mouth daily., Disp: 90 tablet, Rfl: 3 .  vitamin B-12 1000 MCG tablet, Take 1 tablet (1,000 mcg total) by mouth daily., Disp: 30 tablet, Rfl: 1 .  Vitamin D, Ergocalciferol, (DRISDOL) 50000 units CAPS capsule, TAKE ONE CAPSULE BY MOUTH ONCE A WEEK, Disp: 12 capsule, Rfl: 4 .  donepezil (ARICEPT) 5 MG tablet, TAKE ONE TABLET BY MOUTH AT BEDTIME (Patient not taking: Reported on 10/08/2017), Disp: 90 tablet, Rfl: 3 .  memantine (NAMENDA XR) 28 MG CP24 24 hr capsule, TAKE ONE CAPSULE BY MOUTH ONCE DAILY (Patient not taking: Reported on 10/08/2017), Disp: 90 capsule, Rfl: 1  Review of Systems  Constitutional: Negative for appetite change, chills, fatigue  and fever.  Respiratory: Negative for chest tightness and shortness of breath.   Cardiovascular: Negative for chest pain and palpitations.  Gastrointestinal: Negative for abdominal pain, nausea and vomiting.  Neurological: Negative for dizziness and weakness.    Social History   Tobacco Use  . Smoking status: Former Smoker    Packs/day: 0.50    Years: 8.00    Pack years: 4.00    Types: Cigarettes  . Smokeless tobacco: Never Used  . Tobacco comment: > 50 years ago  Substance Use Topics  . Alcohol use: No    Alcohol/week: 0.0 oz   Objective:   BP 120/70 (BP Location: Right Arm, Patient Position: Sitting, Cuff Size: Large)   Pulse 78   Temp 98.8 F (37.1 C) (Oral)   Resp 16   Ht 5\' 7"  (1.702 m)   Wt 187 lb (84.8 kg)   SpO2 98%   BMI 29.29 kg/m  Vitals:   10/08/17 1431  BP: 120/70  Pulse: 78  Resp: 16  Temp: 98.8 F (37.1 C)  TempSrc: Oral  SpO2: 98%  Weight: 187 lb (84.8 kg)  Height: 5\' 7"  (1.702 m)     Physical Exam   General Appearance:    Alert, cooperative, no distress  Eyes:    PERRL, conjunctiva/corneas clear, EOM's intact       Lungs:     Clear to auscultation bilaterally, respirations unlabored  Heart:    Regular rate and rhythm  Neurologic:   Awake, alert, oriented x 1  No apparent focal neurological           defect.       .  Results for orders placed or performed in visit on 10/08/17  POCT glycosylated hemoglobin (Hb A1C)  Result Value Ref Range   Hemoglobin A1C 7.1    Est. average glucose Bld gHb Est-mCnc 157   POCT UA - Microalbumin  Result Value Ref Range   Microalbumin Ur, POC 50 mg/L       Assessment & Plan:     1. Type 2 diabetes mellitus without complication, without long-term current use of insulin (HCC)  - POCT glycosylated hemoglobin (Hb A1C) - POCT UA - Microalbumin  2. Essential hypertension Well controlled.  Continue current medications.    3. Dementia in Alzheimer's disease Doing well current dose of Aricept.  Continue unchanged.   Return in about 4 months (around 02/21/2018).        Lelon Huh, MD  [facility]

## 2017-10-12 ENCOUNTER — Other Ambulatory Visit: Payer: Self-pay | Admitting: Family Medicine

## 2017-10-12 DIAGNOSIS — Z1231 Encounter for screening mammogram for malignant neoplasm of breast: Secondary | ICD-10-CM

## 2017-10-18 ENCOUNTER — Ambulatory Visit: Payer: Medicare HMO | Admitting: Family Medicine

## 2017-10-18 ENCOUNTER — Encounter: Payer: Self-pay | Admitting: Family Medicine

## 2017-10-18 VITALS — BP 140/72 | HR 102 | Temp 98.5°F | Resp 18 | Wt 186.0 lb

## 2017-10-18 DIAGNOSIS — R410 Disorientation, unspecified: Secondary | ICD-10-CM | POA: Diagnosis not present

## 2017-10-18 DIAGNOSIS — R4182 Altered mental status, unspecified: Secondary | ICD-10-CM

## 2017-10-18 DIAGNOSIS — G3281 Cerebellar ataxia in diseases classified elsewhere: Secondary | ICD-10-CM

## 2017-10-18 LAB — POCT URINALYSIS DIPSTICK
BILIRUBIN UA: NEGATIVE
Glucose, UA: NEGATIVE
Leukocytes, UA: NEGATIVE
Nitrite, UA: NEGATIVE
ODOR: NORMAL
PH UA: 6.5 (ref 5.0–8.0)
Protein, UA: 30
Spec Grav, UA: 1.02 (ref 1.010–1.025)
UROBILINOGEN UA: 0.2 U/dL

## 2017-10-18 MED ORDER — HALOPERIDOL 1 MG PO TABS: ORAL_TABLET | ORAL | 0 refills | Status: DC

## 2017-10-18 MED ORDER — AZITHROMYCIN 250 MG PO TABS: ORAL_TABLET | ORAL | 0 refills | Status: AC

## 2017-10-18 NOTE — Progress Notes (Signed)
Patient: Briana Austin Female    DOB: 1934-05-19   83 y.o.   MRN: [id number] Visit Date: 10/18/2017  Today's Provider: Lelon Huh, MD   Chief Complaint  Patient presents with  . Altered Mental Status    x 3 days   Subjective:    HPI Confusion:  Patients daughter comes in reporting that patient has had symptoms of confusion, disorientation for the past 3 days. Patient is not sleeping and gets up at 1am every night to get dressed. Patient has had trouble swallowing. Her blood sugar was was 200.   She states she was in her usual state of health during the day of 12/14, but she went to bed late. She woke up at 3am and started getting dressed for the day. She did not sleep the rest of the night. The next day she seemed weaker than usual. She was up most of the next two nights as well an today noted have trouble walking without use of a walker, which she had not been using before. She has also been coughing more mucous that usual and having some hiccups. Has been awake and alert. She has history of dementia and is not usually oriented to time or place, which is unchanged. She has no pains or dyspnea.     No Known Allergies   Current Outpatient Medications:  .  ACCU-CHEK SOFTCLIX LANCETS lancets, 1 each by Other route daily. Use as instructed , Disp: , Rfl:  .  aspirin EC 81 MG tablet, Take 81 mg by mouth daily., Disp: , Rfl:  .  calcium carbonate (OS-CAL) 600 MG TABS tablet, Take 1 tablet by mouth daily., Disp: , Rfl:  .  donepezil (ARICEPT) 5 MG tablet, TAKE ONE TABLET BY MOUTH AT BEDTIME (Patient not taking: Reported on 10/08/2017), Disp: 90 tablet, Rfl: 3 .  feeding supplement, ENSURE ENLIVE, (ENSURE ENLIVE) LIQD, Take 237 mLs by mouth 2 (two) times daily between meals., Disp: 237 mL, Rfl: 12 .  glipiZIDE (GLUCOTROL XL) 5 MG 24 hr tablet, TAKE ONE TABLET BY MOUTH ONCE DAILY WITH BREAKFAST, Disp: 90 tablet, Rfl: 3 .  glucose blood (ACCU-CHEK AVIVA PLUS) test strip, Use to check  blood sugar once a day with Soft-clix Lancet. E11.9, Disp: 100 each, Rfl: 4 .  KLOR-CON M10 10 MEQ tablet, TAKE 1 TABLET BY MOUTH ONCE DAILY, Disp: 30 tablet, Rfl: 12 .  Lancet Devices (ACCU-CHEK SOFTCLIX) lancets, Use as instructed, Disp: 100 each, Rfl: 4 .  levETIRAcetam (KEPPRA) 500 MG tablet, Take 1 tablet (500 mg total) by mouth 2 (two) times daily., Disp: 180 tablet, Rfl: 4 .  lisinopril (PRINIVIL,ZESTRIL) 20 MG tablet, Take 1 tablet (20 mg total) by mouth daily., Disp: 90 tablet, Rfl: 3 .  memantine (NAMENDA XR) 28 MG CP24 24 hr capsule, TAKE ONE CAPSULE BY MOUTH ONCE DAILY (Patient not taking: Reported on 10/08/2017), Disp: 90 capsule, Rfl: 1 .  Memantine HCl-Donepezil HCl (NAMZARIC) 28-10 MG CP24, Take 1 capsule by mouth daily., Disp: 30 capsule, Rfl: 11 .  metFORMIN (GLUCOPHAGE-XR) 500 MG 24 hr tablet, TAKE TWO TABLETS BY MOUTH ONCE DAILY, Disp: 60 tablet, Rfl: 12 .  ONE TOUCH LANCETS MISC, 1 Device by Does not apply route daily., Disp: 100 each, Rfl: 4 .  pravastatin (PRAVACHOL) 40 MG tablet, Take 1 tablet (40 mg total) by mouth daily., Disp: 90 tablet, Rfl: 3 .  vitamin B-12 1000 MCG tablet, Take 1 tablet (1,000 mcg total) by mouth daily.,  Disp: 30 tablet, Rfl: 1 .  Vitamin D, Ergocalciferol, (DRISDOL) 50000 units CAPS capsule, TAKE ONE CAPSULE BY MOUTH ONCE A WEEK, Disp: 12 capsule, Rfl: 4  Review of Systems  Constitutional: Negative for appetite change, chills, fatigue and fever.  Respiratory: Negative for chest tightness and shortness of breath.   Cardiovascular: Negative for chest pain and palpitations.  Gastrointestinal: Negative for abdominal pain, nausea and vomiting.       Trouble swallowing  Neurological: Negative for dizziness and weakness.  Psychiatric/Behavioral: Positive for agitation, confusion and sleep disturbance. Negative for hallucinations.    Social History   Tobacco Use  . Smoking status: Former Smoker    Packs/day: 0.50    Years: 8.00    Pack years: 4.00      Types: Cigarettes  . Smokeless tobacco: Never Used  . Tobacco comment: > 50 years ago  Substance Use Topics  . Alcohol use: No    Alcohol/week: 0.0 oz   Objective:   BP 140/72   Pulse (!) 102   Temp 98.5 F (36.9 C) (Oral)   Resp 18   Wt 186 lb (84.4 kg)   SpO2 97% Comment: room air  BMI 29.13 kg/m  There were no vitals filed for this visit.   Physical Exam   General Appearance:    Alert, cooperative, no distress,   Eyes:    PERRL, conjunctiva/corneas clear, EOM's intact       Lungs:     Clear to auscultation bilaterally, respirations unlabored  Heart:    Regular rate and rhythm  Neurologic:   Awake, alert, oriented x 1. No apparent focal neurological           defect. Considerably more talkative than usual. Coughs occasionally. Otherwise does no appear at all uncomfortable. Requires assistance to walk.       Results for orders placed or performed in visit on 10/18/17  POCT Urinalysis Dipstick  Result Value Ref Range   Color, UA yellow    Clarity, UA clear    Glucose, UA negative    Bilirubin, UA negative    Ketones, UA small (15)    Spec Grav, UA 1.020 1.010 - 1.025   Blood, UA Trace (Non-hemolyzed)    pH, UA 6.5 5.0 - 8.0   Protein, UA 30 (+)    Urobilinogen, UA 0.2 0.2 or 1.0 E.U./dL   Nitrite, UA negative    Leukocytes, UA Negative Negative   Appearance clear    Odor normal         Assessment & Plan:     1. Altered mental status, unspecified altered mental status type  - COMPLETE METABOLIC PANEL WITH GFR - CBC - TSH - Ammonia  2. Confusion  - POCT Urinalysis Dipstick - COMPLETE METABOLIC PANEL WITH GFR - CBC - TSH - Ammonia  3. Cerebellar ataxia in diseases classified elsewhere [facility])  - CT HEAD [place]; Future  She does have significant underlying dementia, but symptoms today are clearly changed from her baseline. She does have mild cough. Ddx includes metabolic disorder, occult infection and CVA. Labs and CT ordered as above.   Sent prescription azithromycin to cover for possible respiratory infection and haloperidol to help rest at night in the short term.  Advised family to take her to ER if there is any change in her status.      Lelon Huh, MD  [facility]

## 2017-10-19 ENCOUNTER — Telehealth: Payer: Self-pay

## 2017-10-19 ENCOUNTER — Telehealth: Payer: Self-pay | Admitting: Family Medicine

## 2017-10-19 DIAGNOSIS — G3281 Cerebellar ataxia in diseases classified elsewhere: Secondary | ICD-10-CM

## 2017-10-19 LAB — COMPLETE METABOLIC PANEL WITH GFR
AG Ratio: 1.1 (calc) (ref 1.0–2.5)
ALT: 16 U/L (ref 6–29)
AST: 15 U/L (ref 10–35)
Albumin: 4.2 g/dL (ref 3.6–5.1)
Alkaline phosphatase (APISO): 55 U/L (ref 33–130)
BUN/Creatinine Ratio: 13 (calc) (ref 6–22)
BUN: 18 mg/dL (ref 7–25)
CALCIUM: 10.7 mg/dL — AB (ref 8.6–10.4)
CO2: 26 mmol/L (ref 20–32)
CREATININE: 1.37 mg/dL — AB (ref 0.60–0.88)
Chloride: 98 mmol/L (ref 98–110)
GFR, EST NON AFRICAN AMERICAN: 36 mL/min/{1.73_m2} — AB (ref >=60)
GFR, Est African American: 42 mL/min/{1.73_m2} — ABNORMAL LOW (ref >=60)
GLUCOSE: 184 mg/dL — AB (ref 65–99)
Globulin: 3.8 g/dL (calc) — ABNORMAL HIGH (ref 1.9–3.7)
POTASSIUM: 4.2 mmol/L (ref 3.5–5.3)
Sodium: 138 mmol/L (ref 135–146)
Total Bilirubin: 0.4 mg/dL (ref 0.2–1.2)
Total Protein: 8 g/dL (ref 6.1–8.1)

## 2017-10-19 LAB — TSH: TSH: 0.7 mIU/L (ref 0.40–4.50)

## 2017-10-19 LAB — AMMONIA: AMMONIA: 48 umol/L (ref <=72)

## 2017-10-19 LAB — CBC
HCT: 31.8 % — ABNORMAL LOW (ref 35.0–45.0)
HEMOGLOBIN: 10.9 g/dL — AB (ref 11.7–15.5)
MCH: 31.9 pg (ref 27.0–33.0)
MCHC: 34.3 g/dL (ref 32.0–36.0)
MCV: 93 fL (ref 80.0–100.0)
MPV: 11.9 fL (ref 7.5–12.5)
PLATELETS: 217 10*3/uL (ref 140–400)
RBC: 3.42 10*6/uL — AB (ref 3.80–5.10)
RDW: 12.6 % (ref 11.0–15.0)
WBC: 9.1 10*3/uL (ref 3.8–10.8)

## 2017-10-19 NOTE — Telephone Encounter (Signed)
-----   Message from Birdie Sons, MD sent at 10/19/2017  7:44 AM EST ----- Labs are all normal. Proceed with head CT to rule out stroke, they should here from [facility] about this today.

## 2017-10-19 NOTE — Telephone Encounter (Signed)
CT ordered. 

## 2017-10-19 NOTE — Telephone Encounter (Signed)
Mrs. Briana Austin [facility]) advised as below.

## 2017-10-19 NOTE — Telephone Encounter (Signed)
Per CT tech at [facility] for diagnosis given CT should be w/o contrast only

## 2017-10-20 ENCOUNTER — Telehealth: Payer: Self-pay | Admitting: Family Medicine

## 2017-10-20 MED ORDER — CHLORPROMAZINE HCL 25 MG PO TABS: 25.0000 mg | ORAL_TABLET | Freq: Three times a day (TID) | ORAL | 0 refills | Status: DC

## 2017-10-20 NOTE — Telephone Encounter (Signed)
Please advise-Briana Austin, RMA  

## 2017-10-20 NOTE — Telephone Encounter (Signed)
Advised husband as below.  

## 2017-10-20 NOTE — Telephone Encounter (Signed)
Prescription chlorpromazine sent to [facility]

## 2017-10-20 NOTE — Telephone Encounter (Signed)
Pt's daughter [name] called requesting a referral for hiccups.Pt is not eating well because of this

## 2017-10-21 ENCOUNTER — Ambulatory Visit
Admission: RE | Admit: 2017-10-21 | Discharge: 2017-10-21 | Disposition: A | Discharge status: Home / Self Care | Payer: Medicare HMO | Source: Ambulatory Visit | Attending: Family Medicine | Admitting: Family Medicine

## 2017-10-21 DIAGNOSIS — I639 Cerebral infarction, unspecified: Secondary | ICD-10-CM | POA: Insufficient documentation

## 2017-10-21 DIAGNOSIS — G3281 Cerebellar ataxia in diseases classified elsewhere: Secondary | ICD-10-CM | POA: Insufficient documentation

## 2017-10-21 DIAGNOSIS — R4020 Unspecified coma: Secondary | ICD-10-CM | POA: Diagnosis not present

## 2017-10-21 NOTE — Telephone Encounter (Signed)
Briana Austin, please call patient about his schedule Thanks ED

## 2017-10-21 NOTE — Telephone Encounter (Signed)
I spoke to pt's husband [name] earlier today to advise that pt is to go to [place] today 10/21/17

## 2017-10-21 NOTE — Telephone Encounter (Signed)
Pt's husband is requesting Judson Roch return his call to discuss scheduling pt's CT scan. Please advise. Thanks TNP

## 2017-10-28 ENCOUNTER — Other Ambulatory Visit: Payer: Self-pay | Admitting: Family Medicine

## 2017-10-30 ENCOUNTER — Other Ambulatory Visit: Payer: Self-pay | Admitting: Family Medicine

## 2017-10-30 DIAGNOSIS — E119 Type 2 diabetes mellitus without complications: Secondary | ICD-10-CM

## 2017-11-11 ENCOUNTER — Other Ambulatory Visit: Payer: Self-pay | Admitting: Family Medicine

## 2017-11-11 DIAGNOSIS — E119 Type 2 diabetes mellitus without complications: Secondary | ICD-10-CM

## 2017-12-01 ENCOUNTER — Ambulatory Visit
Admission: RE | Admit: 2017-12-01 | Discharge: 2017-12-01 | Disposition: A | Discharge status: Home / Self Care | Payer: Medicare HMO | Source: Ambulatory Visit | Attending: Family Medicine | Admitting: Family Medicine

## 2017-12-01 DIAGNOSIS — Z1231 Encounter for screening mammogram for malignant neoplasm of breast: Secondary | ICD-10-CM | POA: Diagnosis not present

## 2018-01-25 ENCOUNTER — Encounter: Payer: Self-pay | Admitting: Family Medicine

## 2018-01-25 ENCOUNTER — Ambulatory Visit (INDEPENDENT_AMBULATORY_CARE_PROVIDER_SITE_OTHER): Payer: Medicare HMO | Admitting: Family Medicine

## 2018-01-25 VITALS — BP 120/74 | HR 91 | Temp 99.0°F | Resp 16 | Wt 176.0 lb

## 2018-01-25 DIAGNOSIS — J40 Bronchitis, not specified as acute or chronic: Secondary | ICD-10-CM

## 2018-01-25 DIAGNOSIS — R05 Cough: Secondary | ICD-10-CM | POA: Diagnosis not present

## 2018-01-25 DIAGNOSIS — R059 Cough, unspecified: Secondary | ICD-10-CM

## 2018-01-25 MED ORDER — LEVOFLOXACIN 750 MG PO TABS: 750.0000 mg | ORAL_TABLET | Freq: Every day | ORAL | 0 refills | Status: AC

## 2018-01-25 NOTE — Patient Instructions (Signed)
   Reduce glipizide to 1/2 tablet daily while taking the antibiotic

## 2018-01-25 NOTE — Progress Notes (Signed)
Patient: Briana Austin Female    DOB: [birth year]   84 y.o.   MRN: [id number] Visit Date: 01/25/2018  Today's Provider: Lelon Huh, MD   Chief Complaint  Patient presents with  . Cough   Subjective:    Patient's family state patient has had a productive cough for around 2 weeks. Over the few days patient;s cough has worsened. Last night they checked her temperature and it was running 100.6. Patient's family has given her otc allegra with mild relief.   Cough  This is a new problem. The current episode started 1 to 4 weeks ago (2 weeks). The problem has been gradually worsening. The cough is productive of sputum. Associated symptoms include a fever. Pertinent negatives include no chest pain, chills, nasal congestion, postnasal drip, rhinorrhea or shortness of breath.       No Known Allergies   Current Outpatient Medications:  .  ACCU-CHEK AVIVA PLUS test strip, USE TO CHECK BLOOD GLUCOSE ONCE DAILY WITH SOFT-CLIX LANCETS, Disp: 100 each, Rfl: 3 .  ACCU-CHEK SOFTCLIX LANCETS lancets, USE AS DIRECTED, Disp: 100 each, Rfl: 4 .  aspirin EC 81 MG tablet, Take 81 mg by mouth daily., Disp: , Rfl:  .  Blood Glucose Monitoring Suppl (ACCU-CHEK AVIVA PLUS) w/Device KIT, AS DIRECTED, Disp: 1 kit, Rfl: 0 .  calcium carbonate (OS-CAL) 600 MG TABS tablet, Take 1 tablet by mouth daily., Disp: , Rfl:  .  feeding supplement, ENSURE ENLIVE, (ENSURE ENLIVE) LIQD, Take 237 mLs by mouth 2 (two) times daily between meals., Disp: 237 mL, Rfl: 12 .  GLIPIZIDE XL 5 MG 24 hr tablet, TAKE ONE TABLET BY MOUTH ONCE DAILY WITH BREAKFAST, Disp: 90 tablet, Rfl: 4 .  KLOR-CON M10 10 MEQ tablet, TAKE 1 TABLET BY MOUTH ONCE DAILY, Disp: 30 tablet, Rfl: 12 .  levETIRAcetam (KEPPRA) 500 MG tablet, Take 1 tablet (500 mg total) by mouth 2 (two) times daily., Disp: 180 tablet, Rfl: 4 .  lisinopril (PRINIVIL,ZESTRIL) 20 MG tablet, Take 1 tablet (20 mg total) by mouth daily., Disp: 90 tablet, Rfl: 3 .  Memantine  HCl-Donepezil HCl (NAMZARIC) 28-10 MG CP24, Take 1 capsule by mouth daily., Disp: 30 capsule, Rfl: 11 .  metFORMIN (GLUCOPHAGE-XR) 500 MG 24 hr tablet, TAKE TWO TABLETS BY MOUTH ONCE DAILY (Patient taking differently: TAKE ONE TABLET BY MOUTH ONCE DAILY), Disp: 60 tablet, Rfl: 12 .  ONE TOUCH LANCETS MISC, 1 Device by Does not apply route daily., Disp: 100 each, Rfl: 4 .  pravastatin (PRAVACHOL) 40 MG tablet, Take 1 tablet (40 mg total) by mouth daily., Disp: 90 tablet, Rfl: 3 .  vitamin B-12 1000 MCG tablet, Take 1 tablet (1,000 mcg total) by mouth daily., Disp: 30 tablet, Rfl: 1 .  Vitamin D, Ergocalciferol, (DRISDOL) 50000 units CAPS capsule, TAKE ONE CAPSULE BY MOUTH ONCE A WEEK, Disp: 12 capsule, Rfl: 4   Review of Systems  Constitutional: Positive for fever. Negative for appetite change, chills and fatigue.  HENT: Negative for postnasal drip and rhinorrhea.   Respiratory: Positive for cough. Negative for chest tightness and shortness of breath.   Cardiovascular: Negative for chest pain and palpitations.  Gastrointestinal: Negative for abdominal pain, nausea and vomiting.  Neurological: Negative for dizziness and weakness.    Social History   Tobacco Use  . Smoking status: Former Smoker    Packs/day: 0.50    Years: 8.00    Pack years: 4.00    Types: Cigarettes  . Smokeless  tobacco: Never Used  . Tobacco comment: > 50 years ago  Substance Use Topics  . Alcohol use: No    Alcohol/week: 0.0 oz   Objective:   BP 120/74 (BP Location: Right Arm, Patient Position: Sitting, Cuff Size: Normal)   Pulse 91   Temp 99 F (37.2 C) (Oral)   Resp 16   Wt 176 lb (79.8 kg)   SpO2 95%   BMI 27.57 kg/m  Vitals:   01/25/18 0844  BP: 120/74  Pulse: 91  Resp: 16  Temp: 99 F (37.2 C)  TempSrc: Oral  SpO2: 95%  Weight: 176 lb (79.8 kg)     Physical Exam  General Appearance:    Alert, cooperative, no distress  HENT:   ENT exam normal, no neck nodes or sinus tenderness  Eyes:     PERRL, conjunctiva/corneas clear, EOM's intact       Lungs:     Occasional expiratory wheeze, no rales, respirations unlabored  Heart:    Regular rate and rhythm  Neurologic:   Awake, interactive, but a little more sedate than usual.           Assessment & Plan:     1. Bronchitis  - levofloxacin (LEVAQUIN) 750 MG tablet; Take 1 tablet (750 mg total) by mouth daily for 5 days.  Dispense: 5 tablet; Refill: 0  2. Cough   Call if symptoms change or if not rapidly improving.           Lelon Huh, MD  [facility]

## 2018-01-31 ENCOUNTER — Encounter: Payer: Self-pay | Admitting: Family Medicine

## 2018-01-31 ENCOUNTER — Ambulatory Visit (INDEPENDENT_AMBULATORY_CARE_PROVIDER_SITE_OTHER): Payer: Medicare HMO | Admitting: Family Medicine

## 2018-01-31 VITALS — BP 104/54 | HR 90 | Temp 98.4°F | Resp 16 | Wt 173.0 lb

## 2018-01-31 DIAGNOSIS — J301 Allergic rhinitis due to pollen: Secondary | ICD-10-CM

## 2018-01-31 MED ORDER — MONTELUKAST SODIUM 10 MG PO TABS: 10.0000 mg | ORAL_TABLET | Freq: Every day | ORAL | 1 refills | Status: DC

## 2018-01-31 NOTE — Progress Notes (Signed)
Patient: Briana Austin Female    DOB: Mar 02, 1934   84 y.o.   MRN: [id number] Visit Date: 01/31/2018  Today's Provider: Lelon Huh, MD   Chief Complaint  Patient presents with  . Follow-up  . Cough   Subjective:    HPI  Bronchitis and Cough From 01/25/2018-patient was prescribed levofloxacin (LEVAQUIN) 750 MG tablet.  Cough improved, but never resolved and has been worsening the last several days.  Patient is having large amount of clear nasal drainage and nasal congestion which is constant.    No Known Allergies   Current Outpatient Medications:  .  ACCU-CHEK AVIVA PLUS test strip, USE TO CHECK BLOOD GLUCOSE ONCE DAILY WITH SOFT-CLIX LANCETS, Disp: 100 each, Rfl: 3 .  ACCU-CHEK SOFTCLIX LANCETS lancets, USE AS DIRECTED, Disp: 100 each, Rfl: 4 .  aspirin EC 81 MG tablet, Take 81 mg by mouth daily., Disp: , Rfl:  .  Blood Glucose Monitoring Suppl (ACCU-CHEK AVIVA PLUS) w/Device KIT, AS DIRECTED, Disp: 1 kit, Rfl: 0 .  calcium carbonate (OS-CAL) 600 MG TABS tablet, Take 1 tablet by mouth daily., Disp: , Rfl:  .  feeding supplement, ENSURE ENLIVE, (ENSURE ENLIVE) LIQD, Take 237 mLs by mouth 2 (two) times daily between meals., Disp: 237 mL, Rfl: 12 .  GLIPIZIDE XL 5 MG 24 hr tablet, TAKE ONE TABLET BY MOUTH ONCE DAILY WITH BREAKFAST, Disp: 90 tablet, Rfl: 4 .  KLOR-CON M10 10 MEQ tablet, TAKE 1 TABLET BY MOUTH ONCE DAILY, Disp: 30 tablet, Rfl: 12 .  levETIRAcetam (KEPPRA) 500 MG tablet, Take 1 tablet (500 mg total) by mouth 2 (two) times daily., Disp: 180 tablet, Rfl: 4 .  lisinopril (PRINIVIL,ZESTRIL) 20 MG tablet, Take 1 tablet (20 mg total) by mouth daily., Disp: 90 tablet, Rfl: 3 .  Memantine HCl-Donepezil HCl (NAMZARIC) 28-10 MG CP24, Take 1 capsule by mouth daily., Disp: 30 capsule, Rfl: 11 .  metFORMIN (GLUCOPHAGE-XR) 500 MG 24 hr tablet, TAKE TWO TABLETS BY MOUTH ONCE DAILY (Patient taking differently: TAKE ONE TABLET BY MOUTH ONCE DAILY), Disp: 60 tablet, Rfl:  12 .  ONE TOUCH LANCETS MISC, 1 Device by Does not apply route daily., Disp: 100 each, Rfl: 4 .  pravastatin (PRAVACHOL) 40 MG tablet, Take 1 tablet (40 mg total) by mouth daily., Disp: 90 tablet, Rfl: 3 .  vitamin B-12 1000 MCG tablet, Take 1 tablet (1,000 mcg total) by mouth daily., Disp: 30 tablet, Rfl: 1 .  Vitamin D, Ergocalciferol, (DRISDOL) 50000 units CAPS capsule, TAKE ONE CAPSULE BY MOUTH ONCE A WEEK, Disp: 12 capsule, Rfl: 4  Review of Systems  Constitutional: Negative for appetite change, chills, fatigue and fever.  HENT: Positive for congestion, postnasal drip and rhinorrhea.   Respiratory: Positive for cough. Negative for chest tightness and shortness of breath.   Cardiovascular: Negative for chest pain and palpitations.  Gastrointestinal: Negative for abdominal pain, nausea and vomiting.  Neurological: Negative for dizziness and weakness.    Social History   Tobacco Use  . Smoking status: Former Smoker    Packs/day: 0.50    Years: 8.00    Pack years: 4.00    Types: Cigarettes  . Smokeless tobacco: Never Used  . Tobacco comment: > 50 years ago  Substance Use Topics  . Alcohol use: No    Alcohol/week: 0.0 oz   Objective:   BP (!) 104/54 (BP Location: Right Arm, Patient Position: Sitting, Cuff Size: Large)   Pulse 90   Temp 98.4 F (  36.9 C) (Oral)   Resp 16   Wt 173 lb (78.5 kg)   SpO2 97%   BMI 27.10 kg/m  Vitals:   01/31/18 1049  BP: (!) 104/54  Pulse: 90  Resp: 16  Temp: 98.4 F (36.9 C)  TempSrc: Oral  SpO2: 97%  Weight: 173 lb (78.5 kg)     Physical Exam  General Appearance:    Alert, cooperative, no distress  HENT:   bilateral TM normal without fluid or infection, neck without nodes, throat normal without erythema or exudate, post nasal drip noted and nasal mucosa pale and congested  Eyes:    PERRL, conjunctiva/corneas clear, EOM's intact       Lungs:     Clear to auscultation bilaterally, respirations unlabored  Heart:    Regular rate and  rhythm  Neurologic:   Awake, alert, oriented x 3. No apparent focal neurological           defect.           Assessment & Plan:     1. Allergic rhinitis due to pollen, unspecified seasonality  - montelukast (SINGULAIR) 10 MG tablet; Take 1 tablet (10 mg total) by mouth daily.  Dispense: 30 tablet; Refill: 1  Take with OTC non-sedating antihistamine        Lelon Huh, MD  [facility]

## 2018-02-04 ENCOUNTER — Ambulatory Visit
Admission: RE | Admit: 2018-02-04 | Discharge: 2018-02-04 | Disposition: A | Discharge status: Home / Self Care | Payer: Medicare HMO | Source: Ambulatory Visit | Attending: Family Medicine | Admitting: Family Medicine

## 2018-02-04 ENCOUNTER — Telehealth: Payer: Self-pay | Admitting: Family Medicine

## 2018-02-04 DIAGNOSIS — R05 Cough: Secondary | ICD-10-CM | POA: Insufficient documentation

## 2018-02-04 DIAGNOSIS — R053 Chronic cough: Secondary | ICD-10-CM

## 2018-02-04 NOTE — Telephone Encounter (Signed)
Patient's daughter [name] was notified. X-ray ordered.

## 2018-02-04 NOTE — Telephone Encounter (Signed)
Patient's daughter states that you put patient on montelukast (SINGULAIR) 10 MG tablet.  She states that it is not helping and she doesn't know what they need to do.  She states that she would like to do something different because what they have been doing for the last two weeks is not working and has not helped at all. Please advise.

## 2018-02-04 NOTE — Telephone Encounter (Signed)
Please advise 

## 2018-02-04 NOTE — Telephone Encounter (Signed)
She needs to go ahead and get chest XR for persistent cough. If this is negative then we will need to get her something stronger for post sinus drainage.

## 2018-02-07 ENCOUNTER — Telehealth: Payer: Self-pay | Admitting: *Deleted

## 2018-02-07 ENCOUNTER — Telehealth: Payer: Self-pay | Admitting: Family Medicine

## 2018-02-07 MED ORDER — AZELASTINE HCL 0.1 % NA SOLN: 2.0000 | Freq: Two times a day (BID) | NASAL | 1 refills | Status: DC

## 2018-02-07 NOTE — Telephone Encounter (Signed)
Pt requesting results from xray on Friday.

## 2018-02-07 NOTE — Telephone Encounter (Signed)
-----   Message from Birdie Sons, MD sent at 02/06/2018  9:26 PM EDT ----- Chest xray is clear. No sign pulmonary infection. Cough is all due to post nasal drainage. Recommend trial of astelin nasal spray, 2 sprays in each nostril twice a day, #1, rf x 1.

## 2018-02-07 NOTE — Telephone Encounter (Signed)
Patient's husband [name] was advised of results. Expressed understanding. Rx sent to pharmacy.

## 2018-02-11 DIAGNOSIS — R05 Cough: Secondary | ICD-10-CM | POA: Diagnosis not present

## 2018-02-11 DIAGNOSIS — J310 Chronic rhinitis: Secondary | ICD-10-CM | POA: Diagnosis not present

## 2018-02-14 ENCOUNTER — Other Ambulatory Visit: Payer: Self-pay | Admitting: Family Medicine

## 2018-02-21 ENCOUNTER — Ambulatory Visit: Payer: Self-pay | Admitting: Family Medicine

## 2018-02-24 ENCOUNTER — Other Ambulatory Visit: Payer: Self-pay | Admitting: Family Medicine

## 2018-02-28 ENCOUNTER — Encounter: Payer: Self-pay | Admitting: Family Medicine

## 2018-02-28 ENCOUNTER — Ambulatory Visit (INDEPENDENT_AMBULATORY_CARE_PROVIDER_SITE_OTHER): Payer: Medicare HMO | Admitting: Family Medicine

## 2018-02-28 VITALS — BP 120/64 | HR 78 | Temp 97.6°F | Resp 16 | Ht 67.0 in | Wt 174.0 lb

## 2018-02-28 DIAGNOSIS — E1121 Type 2 diabetes mellitus with diabetic nephropathy: Secondary | ICD-10-CM | POA: Diagnosis not present

## 2018-02-28 DIAGNOSIS — R569 Unspecified convulsions: Secondary | ICD-10-CM | POA: Diagnosis not present

## 2018-02-28 DIAGNOSIS — I10 Essential (primary) hypertension: Secondary | ICD-10-CM | POA: Diagnosis not present

## 2018-02-28 DIAGNOSIS — F0280 Dementia in other diseases classified elsewhere without behavioral disturbance: Secondary | ICD-10-CM | POA: Diagnosis not present

## 2018-02-28 DIAGNOSIS — G309 Alzheimer's disease, unspecified: Secondary | ICD-10-CM

## 2018-02-28 DIAGNOSIS — E559 Vitamin D deficiency, unspecified: Secondary | ICD-10-CM | POA: Diagnosis not present

## 2018-02-28 DIAGNOSIS — D649 Anemia, unspecified: Secondary | ICD-10-CM

## 2018-02-28 DIAGNOSIS — E7849 Other hyperlipidemia: Secondary | ICD-10-CM | POA: Diagnosis not present

## 2018-02-28 LAB — POCT GLYCOSYLATED HEMOGLOBIN (HGB A1C)
ESTIMATED AVERAGE GLUCOSE: 143
Hemoglobin A1C: 6.6

## 2018-02-28 NOTE — Progress Notes (Signed)
Patient: Briana Austin Female    DOB: 1934/06/16   84 y.o.   MRN: [id number] Visit Date: 02/28/2018  Today's Provider: Lelon Huh, MD   Chief Complaint  Patient presents with  . Follow-up  . Diabetes  . Hypertension   Subjective:    HPI   Diabetes Mellitus Type II, Follow-up:   Lab Results  Component Value Date   HGBA1C 6.6 02/28/2018   HGBA1C 7.1 10/08/2017   HGBA1C 7.8 07/09/2017   Last seen for diabetes 4 months ago.  Management since then includes; no changes. She reports good compliance with treatment. She is not having side effects. none Current symptoms include none and have been unchanged. Home blood sugar records: fasting range: under 100  Episodes of hypoglycemia? no   Current Insulin Regimen: n/a Most Recent Eye Exam: 02/14/2017 Weight trend: stable Prior visit with dietician: no Current diet: well balanced Current exercise: none  ----------------------------------------------------------------    Hypertension, follow-up:  BP Readings from Last 3 Encounters:  02/28/18 120/64  01/31/18 (!) 104/54  01/25/18 120/74    She was last seen for hypertension 4 months ago.  BP at that visit was 120/70. Management since that visit includes; no changes.She reports good compliance with treatment. She is not having side effects. none She is not exercising. She is not adherent to low salt diet.   Outside blood pressures are not checking. She is experiencing none.  Patient denies none.   Cardiovascular risk factors include diabetes mellitus.  Use of agents associated with hypertension: none ----------------------------------------------------------------   Dementia in Alzheimer's disease From 10/08/2017-no changes. Doing well current dose of Aricept. Continue unchanged. family reports ADLs unchanged. Taking medications consistently. She has follow up with Dr. Krista Blue (neurology) yearly for history of seizures.     No Known  Allergies   Current Outpatient Medications:  .  ACCU-CHEK AVIVA PLUS test strip, USE TO CHECK BLOOD GLUCOSE ONCE DAILY WITH SOFT-CLIX LANCETS, Disp: 100 each, Rfl: 3 .  ACCU-CHEK SOFTCLIX LANCETS lancets, USE AS DIRECTED, Disp: 100 each, Rfl: 4 .  aspirin EC 81 MG tablet, Take 81 mg by mouth daily., Disp: , Rfl:  .  azelastine (ASTELIN) 0.1 % nasal spray, Place 2 sprays into both nostrils 2 (two) times daily. Use in each nostril as directed, Disp: 30 mL, Rfl: 1 .  Blood Glucose Monitoring Suppl (ACCU-CHEK AVIVA PLUS) w/Device KIT, AS DIRECTED, Disp: 1 kit, Rfl: 0 .  calcium carbonate (OS-CAL) 600 MG TABS tablet, Take 1 tablet by mouth daily., Disp: , Rfl:  .  feeding supplement, ENSURE ENLIVE, (ENSURE ENLIVE) LIQD, Take 237 mLs by mouth 2 (two) times daily between meals., Disp: 237 mL, Rfl: 12 .  Fexofenadine HCl (ALLEGRA ALLERGY PO), Take by mouth., Disp: , Rfl:  .  GLIPIZIDE XL 5 MG 24 hr tablet, TAKE ONE TABLET BY MOUTH ONCE DAILY WITH BREAKFAST, Disp: 90 tablet, Rfl: 4 .  KLOR-CON M10 10 MEQ tablet, TAKE 1 TABLET BY MOUTH ONCE DAILY, Disp: 30 tablet, Rfl: 12 .  levETIRAcetam (KEPPRA) 500 MG tablet, Take 1 tablet (500 mg total) by mouth 2 (two) times daily., Disp: 180 tablet, Rfl: 4 .  lisinopril (PRINIVIL,ZESTRIL) 20 MG tablet, Take 1 tablet (20 mg total) by mouth daily., Disp: 90 tablet, Rfl: 3 .  Memantine HCl-Donepezil HCl (NAMZARIC) 28-10 MG CP24, Take 1 capsule by mouth daily., Disp: 30 capsule, Rfl: 11 .  metFORMIN (GLUCOPHAGE-XR) 500 MG 24 hr tablet, TAKE TWO TABLETS BY MOUTH ONCE  DAILY (Patient taking differently: TAKE ONE TABLET BY MOUTH ONCE DAILY), Disp: 60 tablet, Rfl: 12 .  montelukast (SINGULAIR) 10 MG tablet, Take 1 tablet (10 mg total) by mouth daily., Disp: 30 tablet, Rfl: 1 .  ONE TOUCH LANCETS MISC, 1 Device by Does not apply route daily., Disp: 100 each, Rfl: 4 .  pravastatin (PRAVACHOL) 40 MG tablet, TAKE 1 TABLET BY MOUTH ONCE DAILY, Disp: 90 tablet, Rfl: 4 .  vitamin  B-12 1000 MCG tablet, Take 1 tablet (1,000 mcg total) by mouth daily., Disp: 30 tablet, Rfl: 1 .  Vitamin D, Ergocalciferol, (DRISDOL) 50000 units CAPS capsule, TAKE 1 CAPSULE BY MOUTH ONCE A WEEK, Disp: 12 capsule, Rfl: 4  Review of Systems  Constitutional: Negative for appetite change, chills, fatigue and fever.  Respiratory: Negative for chest tightness and shortness of breath.   Cardiovascular: Negative for chest pain and palpitations.  Gastrointestinal: Negative for abdominal pain, nausea and vomiting.  Neurological: Negative for dizziness and weakness.    Social History   Tobacco Use  . Smoking status: Former Smoker    Packs/day: 0.50    Years: 8.00    Pack years: 4.00    Types: Cigarettes  . Smokeless tobacco: Never Used  . Tobacco comment: > 50 years ago  Substance Use Topics  . Alcohol use: No    Alcohol/week: 0.0 oz   Objective:   BP 120/64 (BP Location: Right Arm, Patient Position: Sitting, Cuff Size: Large)   Pulse 78   Temp 97.6 F (36.4 C) (Oral)   Resp 16   Ht 5' 7"  (1.702 m)   Wt 174 lb (78.9 kg)   SpO2 98%   BMI 27.25 kg/m  Vitals:   02/28/18 0831  BP: 120/64  Pulse: 78  Resp: 16  Temp: 97.6 F (36.4 C)  TempSrc: Oral  SpO2: 98%  Weight: 174 lb (78.9 kg)  Height: 5' 7"  (1.702 m)     Physical Exam   General Appearance:    Alert, cooperative, no distress  Eyes:    PERRL, conjunctiva/corneas clear, EOM's intact       Lungs:     Clear to auscultation bilaterally, respirations unlabored  Heart:    Regular rate and rhythm  Neurologic:   Awake, alert, oriented x 1. No apparent focal neurological           defect.  Socially interactive bu does not follow content of conversation.           Assessment & Plan:     1. Essential hypertension Well controlled.  Continue current medications.    2. Controlled diabetes mellitus with nephropathy ([place]) .2c Continue current medications.   - POCT glycosylated hemoglobin (Hb A1C) - Fexofenadine HCl  (ALLEGRA ALLERGY PO); Take by mouth. - Comprehensive metabolic panel  3. Other hyperlipidemia She is tolerating pravastatin well with no adverse effects.   - Comprehensive metabolic panel - Lipid panel  4. Vitamin D deficiency  - VITAMIN D 25 Hydroxy (Vit-D Deficiency, Fractures)  5. Normocytic anemia  - CBC  6. Dementia in Alzheimer's disease Remains at home cared for by her husband. Stable on current medications. Continues regular follow up neurology.        Lelon Huh, MD  [facility]

## 2018-03-01 ENCOUNTER — Telehealth: Payer: Self-pay | Admitting: *Deleted

## 2018-03-01 LAB — COMPREHENSIVE METABOLIC PANEL
ALK PHOS: 62 IU/L (ref 39–117)
ALT: 11 IU/L (ref 0–32)
AST: 15 IU/L (ref 0–40)
Albumin/Globulin Ratio: 1.2 (ref 1.2–2.2)
Albumin: 4.1 g/dL (ref 3.5–4.7)
BUN/Creatinine Ratio: 11 — ABNORMAL LOW (ref 12–28)
BUN: 15 mg/dL (ref 8–27)
Bilirubin Total: 0.3 mg/dL (ref 0.0–1.2)
CALCIUM: 10.6 mg/dL — AB (ref 8.7–10.3)
CO2: 25 mmol/L (ref 20–29)
CREATININE: 1.37 mg/dL — AB (ref 0.57–1.00)
Chloride: 102 mmol/L (ref 96–106)
GFR calc Af Amer: 41 mL/min/{1.73_m2} — ABNORMAL LOW (ref >59)
GFR, EST NON AFRICAN AMERICAN: 36 mL/min/{1.73_m2} — AB (ref >59)
GLUCOSE: 89 mg/dL (ref 65–99)
Globulin, Total: 3.4 g/dL (ref 1.5–4.5)
Potassium: 4.4 mmol/L (ref 3.5–5.2)
SODIUM: 141 mmol/L (ref 134–144)
Total Protein: 7.5 g/dL (ref 6.0–8.5)

## 2018-03-01 LAB — CBC
Hematocrit: 32 % — ABNORMAL LOW (ref 34.0–46.6)
Hemoglobin: 10.6 g/dL — ABNORMAL LOW (ref 11.1–15.9)
MCH: 31.3 pg (ref 26.6–33.0)
MCHC: 33.1 g/dL (ref 31.5–35.7)
MCV: 94 fL (ref 79–97)
PLATELETS: 235 10*3/uL (ref 150–379)
RBC: 3.39 x10E6/uL — AB (ref 3.77–5.28)
RDW: 14.5 % (ref 12.3–15.4)
WBC: 7 10*3/uL (ref 3.4–10.8)

## 2018-03-01 LAB — VITAMIN D 25 HYDROXY (VIT D DEFICIENCY, FRACTURES): Vit D, 25-Hydroxy: 65 ng/mL (ref 30.0–100.0)

## 2018-03-01 LAB — LIPID PANEL
Chol/HDL Ratio: 3.1 ratio (ref 0.0–4.4)
Cholesterol, Total: 166 mg/dL (ref 100–199)
HDL: 54 mg/dL (ref >39)
LDL CALC: 92 mg/dL (ref 0–99)
TRIGLYCERIDES: 98 mg/dL (ref 0–149)
VLDL Cholesterol Cal: 20 mg/dL (ref 5–40)

## 2018-03-01 NOTE — Telephone Encounter (Signed)
-----   Message from Birdie Sons, MD sent at 03/01/2018  7:56 AM EDT ----- Is slightly anemic, but unchanged from last check. Otherwise normal labs. Continue current medications.  Follow up in September as scheduled.

## 2018-03-01 NOTE — Telephone Encounter (Signed)
LMOVM for pt to return call 

## 2018-03-02 NOTE — Telephone Encounter (Signed)
No, its just part of diabetes and high blood pressure and is not low enough to cause any problems.

## 2018-03-02 NOTE — Telephone Encounter (Signed)
Patient's husband [name] was notified of results. [name] wanted to know if there is anything they can do to get pt's RBC back up to normal range? Please advise?

## 2018-03-03 NOTE — Telephone Encounter (Signed)
Briana Austin was advised.

## 2018-03-04 ENCOUNTER — Telehealth: Payer: Self-pay

## 2018-03-04 NOTE — Telephone Encounter (Signed)
Called to set up AWV prior to CPE on 07/06/18. Spoke with husband who declined having this vist completed due to pt not being able to answer the wellness questions.  -MM

## 2018-03-31 ENCOUNTER — Other Ambulatory Visit: Payer: Self-pay | Admitting: Family Medicine

## 2018-03-31 DIAGNOSIS — I10 Essential (primary) hypertension: Secondary | ICD-10-CM

## 2018-04-19 ENCOUNTER — Other Ambulatory Visit: Payer: Self-pay | Admitting: Family Medicine

## 2018-06-09 ENCOUNTER — Other Ambulatory Visit: Payer: Self-pay | Admitting: Neurology

## 2018-06-15 ENCOUNTER — Other Ambulatory Visit: Payer: Self-pay | Admitting: Neurology

## 2018-07-06 ENCOUNTER — Encounter: Payer: Self-pay | Admitting: Family Medicine

## 2018-07-06 NOTE — Progress Notes (Deleted)
Patient: Briana Austin, Female    DOB: October 30, 1934, 84 y.o.   MRN: [id number] Visit Date: 07/06/2018  Today's Provider: Lelon Huh, MD   No chief complaint on file.  Subjective:     Complete Physical Briana Austin is a 84 y.o. female. She feels {DESC; WELL/FAIRLY WELL/POORLY:18703}. She reports exercising ***. She reports she is sleeping {DESC; WELL/FAIRLY WELL/POORLY:18703}.  -----------------------------------------------------------  Diabetes Mellitus Type II, Follow-up:   Lab Results  Component Value Date   HGBA1C 6.6 02/28/2018   HGBA1C 7.1 10/08/2017   HGBA1C 7.8 07/09/2017   Last seen for diabetes 5 months ago.  Management since then includes; no changes. She reports {excellent/good/fair/poor:19665} compliance with treatment. She {ACTION; IS/IS PNT:61443154} having side effects. *** Current symptoms include {Symptoms; diabetes:14075} and have been {Desc; course:15616}. Home blood sugar records: {diabetes glucometry results:16657}  Episodes of hypoglycemia? {yes***/no:17258}   Current Insulin Regimen: *** Most Recent Eye Exam: *** Weight trend: {trend:16658} Prior visit with dietician: {yes/no:17258} Current diet: {diet habits:16563} Current exercise: {exercise types:16438}  ------------------------------------------------------------------------   Hypertension, follow-up:  BP Readings from Last 3 Encounters:  02/28/18 120/64  01/31/18 (!) 104/54  01/25/18 120/74    She was last seen for hypertension 5 months ago.  BP at that visit was 120/64. Management since that visit includes; no changes.She reports {excellent/good/fair/poor:19665} compliance with treatment. She {ACTION; IS/IS MGQ:67619509} having side effects. *** She {is/is not:9024} exercising. She {is/is not:9024} adherent to low salt diet.   Outside blood pressures are ***. She is experiencing {Symptoms; cardiac:12860}.  Patient denies {Symptoms; cardiac:12860}.   Cardiovascular  risk factors include {cv risk factors:510}.  Use of agents associated with hypertension: {bp agents assoc with hypertension:511::"none"}.   ------------------------------------------------------------------------    Lipid/Cholesterol, Follow-up:   Last seen for this 5 months ago.  Management since that visit includes; labs checked, no changes.  Last Lipid Panel:    Component Value Date/Time   CHOL 166 02/28/2018 0912   CHOL 187 09/14/2014   TRIG 98 02/28/2018 0912   TRIG 132 09/14/2014   HDL 54 02/28/2018 0912   CHOLHDL 3.1 02/28/2018 0912   CHOLHDL 3.1 07/04/2015 1141   VLDL 12 07/04/2015 1141   LDLCALC 92 02/28/2018 0912    She reports {excellent/good/fair/poor:19665} compliance with treatment. She {ACTION; IS/IS TOI:71245809} having side effects. ***  Wt Readings from Last 3 Encounters:  02/28/18 174 lb (78.9 kg)  01/31/18 173 lb (78.5 kg)  01/25/18 176 lb (79.8 kg)    ------------------------------------------------------------------------  Vitamin D deficiency From 02/28/2018-labs checked, no changes.  Normocytic anemia From 02/28/2018-labs checked, showing slightly anemic. No changes.   Dementia in Alzheimer's disease From 02/28/2018-Stable on current medications. Continues regular follow up neurology.     Review of Systems  Social History   Socioeconomic History  . Marital status: Married    Spouse name: Not on file  . Number of children: 1  . Years of education: HS Grad  . Highest education level: Not on file  Occupational History  . Occupation: Retired    Comment: Designer, multimedia  Social Needs  . Financial resource strain: Not on file  . Food insecurity:    Worry: Not on file    Inability: Not on file  . Transportation needs:    Medical: Not on file    Non-medical: Not on file  Tobacco Use  . Smoking status: Former Smoker    Packs/day: 0.50    Years: 8.00    Pack years: 4.00  Types: Cigarettes  . Smokeless tobacco: Never Used    . Tobacco comment: > 50 years ago  Substance and Sexual Activity  . Alcohol use: No    Alcohol/week: 0.0 standard drinks  . Drug use: No  . Sexual activity: Never  Lifestyle  . Physical activity:    Days per week: Not on file    Minutes per session: Not on file  . Stress: Not on file  Relationships  . Social connections:    Talks on phone: Not on file    Gets together: Not on file    Attends religious service: Not on file    Active member of club or organization: Not on file    Attends meetings of clubs or organizations: Not on file    Relationship status: Not on file  . Intimate partner violence:    Fear of current or ex partner: Not on file    Emotionally abused: Not on file    Physically abused: Not on file    Forced sexual activity: Not on file  Other Topics Concern  . Not on file  Social History Narrative   Lives at home with her husband.   Right-handed.   1 cup caffeine daily.    Past Medical History:  Diagnosis Date  . Breast cancer ([place]) 2005   positive, radiation  . Cancer [facility])    right breast 2005 with radiation therapy  . CAP (community acquired pneumonia) 06/14/2015  . Dementia   . Diabetes mellitus without complication ([place])   . History of breast cancer   . Hypertension   . Seizure [facility])      Patient Active Problem List   Diagnosis Date Noted  . Seizures ([place])   . Euthyroid sick syndrome   . 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 Dementia in Alzheimer's disease   . Essential hypertension   . Facial droop 07/03/2015  . TIA (transient ischemic attack)   . Controlled diabetes mellitus with nephropathy ([place]) 03/25/2015  . Hyperlipidemia 03/25/2015  . Normocytic anemia 03/25/2015  . Physical deconditioning 03/25/2015  . Vitamin D deficiency 11/17/2010    Past Surgical History:  Procedure Laterality Date  . BREAST BIOPSY Right    negative 2011  . BREAST EXCISIONAL BIOPSY Right    positive 2005  . BREAST LUMPECTOMY Right 2005   Carcinoma in situ of  right breast  . sigmoid polyp excision  11/24/2004   Hyperplastic polyp  . TOTAL ABDOMINAL HYSTERECTOMY W/ BILATERAL SALPINGOOPHORECTOMY  1981   Benign    Her family history includes Dementia in her brother; Stroke in her mother. There is no history of Breast cancer.      Current Outpatient Medications:  .  ACCU-CHEK AVIVA PLUS test strip, USE TO CHECK BLOOD GLUCOSE ONCE DAILY WITH SOFT-CLIX LANCETS, Disp: 100 each, Rfl: 3 .  ACCU-CHEK SOFTCLIX LANCETS lancets, USE AS DIRECTED, Disp: 100 each, Rfl: 4 .  aspirin EC 81 MG tablet, Take 81 mg by mouth daily., Disp: , Rfl:  .  azelastine (ASTELIN) 0.1 % nasal spray, Place 2 sprays into both nostrils 2 (two) times daily. Use in each nostril as directed, Disp: 30 mL, Rfl: 1 .  Blood Glucose Monitoring Suppl (ACCU-CHEK AVIVA PLUS) w/Device KIT, AS DIRECTED, Disp: 1 kit, Rfl: 0 .  calcium carbonate (OS-CAL) 600 MG TABS tablet, Take 1 tablet by mouth daily., Disp: , Rfl:  .  feeding supplement, ENSURE ENLIVE, (ENSURE ENLIVE) LIQD, Take 237 mLs by mouth 2 (two) times daily between  meals., Disp: 237 mL, Rfl: 12 .  Fexofenadine HCl (ALLEGRA ALLERGY PO), Take by mouth., Disp: , Rfl:  .  GLIPIZIDE XL 5 MG 24 hr tablet, TAKE ONE TABLET BY MOUTH ONCE DAILY WITH BREAKFAST, Disp: 90 tablet, Rfl: 4 .  KLOR-CON M10 10 MEQ tablet, TAKE 1 TABLET BY MOUTH ONCE DAILY, Disp: 30 tablet, Rfl: 12 .  levETIRAcetam (KEPPRA) 500 MG tablet, Take 1 tablet (500 mg total) by mouth 2 (two) times daily., Disp: 180 tablet, Rfl: 4 .  lisinopril (PRINIVIL,ZESTRIL) 20 MG tablet, TAKE 1 TABLET BY MOUTH ONCE DAILY, Disp: 90 tablet, Rfl: 4 .  Memantine HCl-Donepezil HCl (NAMZARIC) 28-10 MG CP24, Take 1 capsule by mouth daily. Please call [phone] to schedule follow up with Dr. Krista Blue., Disp: 90 capsule, Rfl: 0 .  metFORMIN (GLUCOPHAGE-XR) 500 MG 24 hr tablet, TAKE 2 TABLETS BY MOUTH ONCE DAILY, Disp: 180 tablet, Rfl: 3 .  montelukast (SINGULAIR) 10 MG tablet, Take 1 tablet (10 mg total) by  mouth daily., Disp: 30 tablet, Rfl: 1 .  ONE TOUCH LANCETS MISC, 1 Device by Does not apply route daily., Disp: 100 each, Rfl: 4 .  pravastatin (PRAVACHOL) 40 MG tablet, TAKE 1 TABLET BY MOUTH ONCE DAILY, Disp: 90 tablet, Rfl: 4 .  vitamin B-12 1000 MCG tablet, Take 1 tablet (1,000 mcg total) by mouth daily., Disp: 30 tablet, Rfl: 1 .  Vitamin D, Ergocalciferol, (DRISDOL) 50000 units CAPS capsule, TAKE 1 CAPSULE BY MOUTH ONCE A WEEK, Disp: 12 capsule, Rfl: 4  Patient Care Team: Birdie Sons, MD as PCP - General (Family Medicine) Dingeldein, Remo Lipps, MD as Consulting Physician (Ophthalmology) Naoma Diener, MD as Referring Physician (Neurology)     Objective:   Vitals: There were no vitals taken for this visit.  Physical Exam  Activities of Daily Living No flowsheet data found.  Fall Risk Assessment Fall Risk  04/02/2017 03/09/2017 10/18/2015  Falls in the past year? No Yes No  Number falls in past yr: - 1 -  Follow up - Falls prevention discussed -     Depression Screen PHQ 2/9 Scores 07/09/2017 04/02/2017 03/09/2017 12/28/2016  PHQ - 2 Score 2 3 3 2   PHQ- 9 Score 8 13 8 8     Cognitive Testing - 6-CIT  Correct? Score   What year is it? {yes no:22349} {0-4:31231} 0 or 4  What month is it? {yes no:22349} {0-3:21082} 0 or 3  Memorize:    Pia Mau,  44,  [place],      What time is it? (within 1 hour) {yes no:22349} {0-3:21082} 0 or 3  Count backwards from 20 {yes no:22349} {0-4:31231} 0, 2, or 4  Name the months of the year {yes no:22349} {0-4:31231} 0, 2, or 4  Repeat name & address above {yes no:22349} {0-10:5044} 0, 2, 4, 6, 8, or 10       TOTAL SCORE  ***/28   Interpretation:  {normal/abnormal:11317::"Normal"}  Normal (0-7) Abnormal (8-28)    Audit-C Alcohol Use Screening   No flowsheet data found.  A score of 3 or more in women, and 4 or more in men indicates increased risk for alcohol abuse, EXCEPT if all of the points are from question 1    Assessment  & Plan:    Annual Physical Reviewed patient's Family Medical History Reviewed and updated list of patient's medical providers Assessment of cognitive impairment was done Assessed patient's functional ability Established a written schedule for health screening [facility]  Completed and Reviewed  Exercise Activities and Dietary recommendations Goals    . Increase water intake     Recommend increasing water intake to 4 glasses of water every day.       Immunization History  Administered Date(s) Administered  . Influenza, High Dose Seasonal PF 07/26/2014, 08/13/2015, 08/22/2016, 07/09/2017  . Pneumococcal Conjugate-13 04/11/2014  . Pneumococcal Polysaccharide-23 09/21/2002    Health Maintenance  Topic Date Due  . FOOT EXAM  01/[birth year]/1946  . OPHTHALMOLOGY EXAM  02/24/2018  . INFLUENZA VACCINE  06/02/2018  . TETANUS/TDAP  11/02/2026 (Originally 01/23/1953)  . HEMOGLOBIN A1C  08/30/2018  . DEXA SCAN  Completed  . PNA vac Low Risk Adult  Completed     Discussed health benefits of physical activity, and encouraged her to engage in regular exercise appropriate for her age and condition.    ------------------------------------------------------------------------------------------------------------    Lelon Huh, MD  [place]

## 2018-07-26 ENCOUNTER — Ambulatory Visit (INDEPENDENT_AMBULATORY_CARE_PROVIDER_SITE_OTHER): Payer: Medicare HMO | Admitting: Family Medicine

## 2018-07-26 ENCOUNTER — Encounter: Payer: Self-pay | Admitting: Family Medicine

## 2018-07-26 VITALS — BP 105/69 | HR 93 | Temp 99.3°F | Resp 16 | Ht 67.0 in | Wt 176.0 lb

## 2018-07-26 DIAGNOSIS — E1121 Type 2 diabetes mellitus with diabetic nephropathy: Secondary | ICD-10-CM

## 2018-07-26 DIAGNOSIS — G309 Alzheimer's disease, unspecified: Secondary | ICD-10-CM

## 2018-07-26 DIAGNOSIS — I10 Essential (primary) hypertension: Secondary | ICD-10-CM | POA: Diagnosis not present

## 2018-07-26 DIAGNOSIS — F0280 Dementia in other diseases classified elsewhere without behavioral disturbance: Secondary | ICD-10-CM | POA: Diagnosis not present

## 2018-07-26 DIAGNOSIS — Z0000 Encounter for general adult medical examination without abnormal findings: Secondary | ICD-10-CM | POA: Diagnosis not present

## 2018-07-26 DIAGNOSIS — Z23 Encounter for immunization: Secondary | ICD-10-CM | POA: Diagnosis not present

## 2018-07-26 LAB — POCT GLYCOSYLATED HEMOGLOBIN (HGB A1C)
Est. average glucose Bld gHb Est-mCnc: 148
HEMOGLOBIN A1C: 6.8 % — AB (ref 4.0–5.6)

## 2018-07-26 MED ORDER — MEMANTINE HCL-DONEPEZIL HCL ER 28-10 MG PO CP24: 1.0000 | ORAL_CAPSULE | Freq: Every day | ORAL | 3 refills | Status: DC

## 2018-07-26 MED ORDER — LEVETIRACETAM 500 MG PO TABS: 500.0000 mg | ORAL_TABLET | Freq: Two times a day (BID) | ORAL | 4 refills | Status: DC

## 2018-07-26 NOTE — Progress Notes (Signed)
Patient: Briana Austin, Female    DOB: 08-Sep-1934, 84 y.o.   MRN: [id number] Visit Date: 07/26/2018  Today's Provider: Lelon Huh, MD   Chief Complaint  Patient presents with  . Medicare Wellness  . Hypertension  . Diabetes  . Hyperlipidemia  . Anemia  . Dementia   Subjective:    Annual wellness visit Briana Austin is a 84 y.o. female. She feels well. She reports no regular exercising. She reports she is sleeping well.  -----------------------------------------------------------  Hypertension, follow-up:  BP Readings from Last 3 Encounters:  07/26/18 105/69  02/28/18 120/64  01/31/18 (!) 104/54    She was last seen for hypertension 5 months ago.  BP at that visit was 120/64. Management since that visit includes no changes. She reports good compliance with treatment. She is not having side effects.  She is not exercising. She is adherent to low salt diet.   Outside blood pressures are not checked. She is experiencing none.  Patient denies chest pain, chest pressure/discomfort, claudication, dyspnea, exertional chest pressure/discomfort, fatigue, irregular heart beat, lower extremity edema, near-syncope, orthopnea, palpitations, paroxysmal nocturnal dyspnea, syncope and tachypnea.   Cardiovascular risk factors include advanced age (older than 54 for men, 16 for women), diabetes mellitus and hypertension.  Use of agents associated with hypertension: NSAIDS.     Weight trend: fluctuating a bit Wt Readings from Last 3 Encounters:  07/26/18 176 lb (79.8 kg)  02/28/18 174 lb (78.9 kg)  01/31/18 173 lb (78.5 kg)    Current diet: well balanced  ------------------------------------------------------------------------  Diabetes Mellitus Type II, Follow-up:   Lab Results  Component Value Date   HGBA1C 6.6 02/28/2018   HGBA1C 7.1 10/08/2017   HGBA1C 7.8 07/09/2017    Last seen for diabetes 5 months ago.  Management since then includes no  changes. She reports good compliance with treatment. She is not having side effects.  Current symptoms include none and have been stable. Home blood sugar records: fasting range: 120  Episodes of hypoglycemia? no   Current Insulin Regimen: none Most Recent Eye Exam: <1 year ago Weight trend: fluctuating a bit Prior visit with dietician: no Current diet: well balanced Current exercise: none  Pertinent Labs:    Component Value Date/Time   CHOL 166 02/28/2018 0912   CHOL 187 09/14/2014   TRIG 98 02/28/2018 0912   TRIG 132 09/14/2014   HDL 54 02/28/2018 0912   LDLCALC 92 02/28/2018 0912   CREATININE 1.37 (H) 02/28/2018 0912   CREATININE 1.37 (H) 10/18/2017 1600    Wt Readings from Last 3 Encounters:  07/26/18 176 lb (79.8 kg)  02/28/18 174 lb (78.9 kg)  01/31/18 173 lb (78.5 kg)    ------------------------------------------------------------------------  Lipid/Cholesterol, Follow-up:   Last seen for this 5 months ago.  Management changes since that visit include none. . Last Lipid Panel:    Component Value Date/Time   CHOL 166 02/28/2018 0912   CHOL 187 09/14/2014   TRIG 98 02/28/2018 0912   TRIG 132 09/14/2014   HDL 54 02/28/2018 0912   CHOLHDL 3.1 02/28/2018 0912   CHOLHDL 3.1 07/04/2015 1141   VLDL 12 07/04/2015 1141   LDLCALC 92 02/28/2018 0912    Risk factors for vascular disease include diabetes mellitus, hypercholesterolemia and hypertension  She reports good compliance with treatment. She is not having side effects.  Current symptoms include none and have been stable. Weight trend: fluctuating a bit Prior visit with dietician: no Current diet: well balanced  Current exercise: none  Wt Readings from Last 3 Encounters:  07/26/18 176 lb (79.8 kg)  02/28/18 174 lb (78.9 kg)  01/31/18 173 lb (78.5 kg)    ------------------------------------------------------------------- Vitamin D Deficiency: Patient was last seen for this problem 5 months ago  and no changes were made. Patient husband reports good compliance with treatment.   Anemia: Patient was last seen for this problem 5 months ago and no changes were made.   Dementia Alzheimer's Disease:  Patient was last seen for this problem 5 months ago and no changes were made. Patient was to continue regular follow up with Neurology. Patients husband reports good compliance with treatment.   Review of Systems  Constitutional: Negative for chills, fatigue and fever.  HENT: Positive for congestion, sneezing and trouble swallowing. Negative for ear pain, rhinorrhea and sore throat.   Eyes: Negative.  Negative for pain and redness.  Respiratory: Positive for cough. Negative for shortness of breath and wheezing.   Cardiovascular: Negative for chest pain and leg swelling.  Gastrointestinal: Negative for abdominal pain, blood in stool, constipation, diarrhea and nausea.  Endocrine: Negative for polydipsia and polyphagia.  Genitourinary: Negative.  Negative for dysuria, flank pain, hematuria, pelvic pain, vaginal bleeding and vaginal discharge.  Musculoskeletal: Negative for arthralgias, back pain, gait problem and joint swelling.  Skin: Negative for rash.  Neurological: Negative.  Negative for dizziness, tremors, seizures, weakness, light-headedness, numbness and headaches.  Hematological: Negative for adenopathy.  Psychiatric/Behavioral: Positive for confusion and decreased concentration. Negative for behavioral problems and dysphoric mood. The patient is not nervous/anxious and is not hyperactive.     Social History   Socioeconomic History  . Marital status: Married    Spouse name: Not on file  . Number of children: 1  . Years of education: HS Grad  . Highest education level: Not on file  Occupational History  . Occupation: Retired    Comment: Designer, multimedia  Social Needs  . Financial resource strain: Not on file  . Food insecurity:    Worry: Not on file    Inability: Not on  file  . Transportation needs:    Medical: Not on file    Non-medical: Not on file  Tobacco Use  . Smoking status: Former Smoker    Packs/day: 0.50    Years: 8.00    Pack years: 4.00    Types: Cigarettes  . Smokeless tobacco: Never Used  . Tobacco comment: > 50 years ago  Substance and Sexual Activity  . Alcohol use: No    Alcohol/week: 0.0 standard drinks  . Drug use: No  . Sexual activity: Never  Lifestyle  . Physical activity:    Days per week: Not on file    Minutes per session: Not on file  . Stress: Not on file  Relationships  . Social connections:    Talks on phone: Not on file    Gets together: Not on file    Attends religious service: Not on file    Active member of club or organization: Not on file    Attends meetings of clubs or organizations: Not on file    Relationship status: Not on file  . Intimate partner violence:    Fear of current or ex partner: Not on file    Emotionally abused: Not on file    Physically abused: Not on file    Forced sexual activity: Not on file  Other Topics Concern  . Not on file  Social History Narrative  Lives at home with her husband.   Right-handed.   1 cup caffeine daily.    Past Medical History:  Diagnosis Date  . Breast cancer ([place]) 2005   positive, radiation  . Cancer [facility])    right breast 2005 with radiation therapy  . CAP (community acquired pneumonia) 06/14/2015  . Dementia   . Diabetes mellitus without complication ([place])   . History of breast cancer   . Hypertension   . Seizure [facility])      Patient Active Problem List   Diagnosis Date Noted  . Seizures ([place])   . Euthyroid sick syndrome   . 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 Dementia in Alzheimer's disease   . Essential hypertension   . Facial droop 07/03/2015  . TIA (transient ischemic attack)   . Controlled diabetes mellitus with nephropathy ([place]) 03/25/2015  . Hyperlipidemia 03/25/2015  . Normocytic anemia 03/25/2015  . Physical deconditioning 03/25/2015   . Vitamin D deficiency 11/17/2010    Past Surgical History:  Procedure Laterality Date  . BREAST BIOPSY Right    negative 2011  . BREAST EXCISIONAL BIOPSY Right    positive 2005  . BREAST LUMPECTOMY Right 2005   Carcinoma in situ of right breast  . sigmoid polyp excision  11/24/2004   Hyperplastic polyp  . TOTAL ABDOMINAL HYSTERECTOMY W/ BILATERAL SALPINGOOPHORECTOMY  1981   Benign    Her family history includes Dementia in her brother; Stroke in her mother. There is no history of Breast cancer.      Current Outpatient Medications:  .  ACCU-CHEK AVIVA PLUS test strip, USE TO CHECK BLOOD GLUCOSE ONCE DAILY WITH SOFT-CLIX LANCETS, Disp: 100 each, Rfl: 3 .  ACCU-CHEK SOFTCLIX LANCETS lancets, USE AS DIRECTED, Disp: 100 each, Rfl: 4 .  aspirin EC 81 MG tablet, Take 81 mg by mouth daily., Disp: , Rfl:  .  Blood Glucose Monitoring Suppl (ACCU-CHEK AVIVA PLUS) w/Device KIT, AS DIRECTED, Disp: 1 kit, Rfl: 0 .  calcium carbonate (OS-CAL) 600 MG TABS tablet, Take 1 tablet by mouth daily., Disp: , Rfl:  .  feeding supplement, ENSURE ENLIVE, (ENSURE ENLIVE) LIQD, Take 237 mLs by mouth 2 (two) times daily between meals., Disp: 237 mL, Rfl: 12 .  Fexofenadine HCl (ALLEGRA ALLERGY PO), Take by mouth., Disp: , Rfl:  .  GLIPIZIDE XL 5 MG 24 hr tablet, TAKE ONE TABLET BY MOUTH ONCE DAILY WITH BREAKFAST, Disp: 90 tablet, Rfl: 4 .  KLOR-CON M10 10 MEQ tablet, TAKE 1 TABLET BY MOUTH ONCE DAILY, Disp: 30 tablet, Rfl: 12 .  levETIRAcetam (KEPPRA) 500 MG tablet, Take 1 tablet (500 mg total) by mouth 2 (two) times daily., Disp: 180 tablet, Rfl: 4 .  lisinopril (PRINIVIL,ZESTRIL) 20 MG tablet, TAKE 1 TABLET BY MOUTH ONCE DAILY, Disp: 90 tablet, Rfl: 4 .  Memantine HCl-Donepezil HCl (NAMZARIC) 28-10 MG CP24, Take 1 capsule by mouth daily. Please call [phone] to schedule follow up with Dr. Krista Blue., Disp: 90 capsule, Rfl: 0 .  metFORMIN (GLUCOPHAGE-XR) 500 MG 24 hr tablet, TAKE 2 TABLETS BY MOUTH ONCE DAILY  (Patient taking differently: Take 500 mg by mouth daily. ), Disp: 180 tablet, Rfl: 3 .  montelukast (SINGULAIR) 10 MG tablet, Take 1 tablet (10 mg total) by mouth daily., Disp: 30 tablet, Rfl: 1 .  ONE TOUCH LANCETS MISC, 1 Device by Does not apply route daily., Disp: 100 each, Rfl: 4 .  pravastatin (PRAVACHOL) 40 MG tablet, TAKE 1 TABLET BY MOUTH ONCE DAILY, Disp: 90 tablet, Rfl:  4 .  vitamin B-12 1000 MCG tablet, Take 1 tablet (1,000 mcg total) by mouth daily., Disp: 30 tablet, Rfl: 1 .  Vitamin D, Ergocalciferol, (DRISDOL) 50000 units CAPS capsule, TAKE 1 CAPSULE BY MOUTH ONCE A WEEK, Disp: 12 capsule, Rfl: 4  Patient Care Team: Birdie Sons, MD as PCP - General (Family Medicine) Dingeldein, Remo Lipps, MD as Consulting Physician (Ophthalmology) Naoma Diener, MD as Referring Physician (Neurology)     Objective:   Vitals: BP 105/69 (BP Location: Left Arm, Patient Position: Sitting, Cuff Size: Large)   Pulse 93   Temp 99.3 F (37.4 C) (Oral)   Resp 16   Ht _0  (1.702 m)   Wt 176 lb (79.8 kg)   SpO2 96% Comment: room air  BMI 27.57 kg/m   Physical Exam   General Appearance:    Alert, cooperative, no distress  Eyes:    PERRL, conjunctiva/corneas clear, EOM's intact       Lungs:     Clear to auscultation bilaterally, respirations unlabored  Heart:    Regular rate and rhythm  Neurologic:   Awake, alert, oriented x 1. No apparent focal neurological           defect.        Activities of Daily Living In your present state of health, do you have any difficulty performing the following activities: 07/26/2018  Hearing? Y  Vision? N  Difficulty concentrating or making decisions? Y  Walking or climbing stairs? Y  Dressing or bathing? Y  Doing errands, shopping? Y  Some recent data might be hidden    Fall Risk Assessment Fall Risk  07/26/2018 04/02/2017 03/09/2017 10/18/2015  Falls in the past year? No No Yes No  Number falls in past yr: - - 1 -  Follow up - - Falls prevention  discussed -     Depression Screen PHQ 2/9 Scores 07/26/2018 07/09/2017 04/02/2017 03/09/2017  PHQ - 2 Score _1 PHQ- 9 Score _2 Cognitive Testing - 6-CIT-not done due to history of Dementia  Results for orders placed or performed in visit on 07/26/18  POCT HgB A1C  Result Value Ref Range   Hemoglobin A1C 6.8 (A) 4.0 - 5.6 %   HbA1c POC (<> result, manual entry)     HbA1c, POC (prediabetic range)     HbA1c, POC (controlled diabetic range)     Est. average glucose Bld gHb Est-mCnc 148        Assessment & Plan:     Annual Wellness Visit  Reviewed patient's Family Medical History Reviewed and updated list of patient's medical providers Assessment of cognitive impairment was done Assessed patient's functional ability Established a written schedule for health screening [place] Completed and Reviewed  Exercise Activities and Dietary recommendations Goals    . Increase water intake     Recommend increasing water intake to 4 glasses of water every day.       Immunization History  Administered Date(s) Administered  . Influenza, High Dose Seasonal PF 07/26/2014, 08/13/2015, 08/22/2016, 07/09/2017  . Pneumococcal Conjugate-13 04/11/2014  . Pneumococcal Polysaccharide-23 09/21/2002    Health Maintenance  Topic Date Due  . FOOT EXAM  01/22/1946  . OPHTHALMOLOGY EXAM  02/24/2018  . INFLUENZA VACCINE  06/02/2018  . TETANUS/TDAP  11/02/2026 (Originally 01/23/1953)  . HEMOGLOBIN A1C  08/30/2018  . DEXA SCAN  Completed  . PNA vac Low Risk Adult  Completed  Discussed health benefits of physical activity, and encouraged her to engage in regular exercise appropriate for her age and condition.    ------------------------------------------------------------------------------------------------------------  1. Medicare annual wellness visit, subsequent   2. Controlled diabetes mellitus with nephropathy ([place]) Doing well on current  medications.  - POCT HgB A1C  3. Dementia in Alzheimer's disease Stable on current medications. Family prefers to have medications refill here rather than go back to Dr. Krista Blue since patient has been stable.   4. Essential hypertension Well controlled.    5. Need for influenza vaccination Continue current medications.   - Flu vaccine HIGH DOSE PF (Fluzone High dose)  Return in about 6 months (around 01/24/2019).    Lelon Huh, MD  [facility]

## 2018-08-08 ENCOUNTER — Other Ambulatory Visit: Payer: Self-pay | Admitting: Neurology

## 2018-08-31 DIAGNOSIS — E119 Type 2 diabetes mellitus without complications: Secondary | ICD-10-CM | POA: Diagnosis not present

## 2018-08-31 LAB — HM DIABETES EYE EXAM

## 2018-09-15 ENCOUNTER — Other Ambulatory Visit: Payer: Self-pay | Admitting: Family Medicine

## 2018-10-31 ENCOUNTER — Other Ambulatory Visit: Payer: Self-pay | Admitting: Family Medicine

## 2018-10-31 MED ORDER — GLUCOSE BLOOD VI STRP: ORAL_STRIP | 4 refills | Status: DC

## 2018-11-03 ENCOUNTER — Other Ambulatory Visit: Payer: Self-pay | Admitting: Family Medicine

## 2018-11-03 DIAGNOSIS — Z1231 Encounter for screening mammogram for malignant neoplasm of breast: Secondary | ICD-10-CM

## 2018-12-05 ENCOUNTER — Ambulatory Visit
Admission: RE | Admit: 2018-12-05 | Discharge: 2018-12-05 | Disposition: A | Discharge status: Home / Self Care | Payer: Medicare HMO | Source: Ambulatory Visit | Attending: Family Medicine | Admitting: Family Medicine

## 2018-12-05 DIAGNOSIS — Z1231 Encounter for screening mammogram for malignant neoplasm of breast: Secondary | ICD-10-CM | POA: Diagnosis not present

## 2018-12-05 HISTORY — DX: Personal history of irradiation: Z92.3

## 2019-01-25 ENCOUNTER — Ambulatory Visit: Payer: Self-pay | Admitting: Family Medicine

## 2019-02-01 ENCOUNTER — Other Ambulatory Visit: Payer: Self-pay | Admitting: Family Medicine

## 2019-02-01 DIAGNOSIS — E119 Type 2 diabetes mellitus without complications: Secondary | ICD-10-CM

## 2019-03-06 ENCOUNTER — Ambulatory Visit: Payer: Self-pay | Admitting: Family Medicine

## 2019-03-08 ENCOUNTER — Other Ambulatory Visit: Payer: Self-pay | Admitting: Family Medicine

## 2019-03-08 DIAGNOSIS — E119 Type 2 diabetes mellitus without complications: Secondary | ICD-10-CM

## 2019-04-12 ENCOUNTER — Other Ambulatory Visit: Payer: Self-pay | Admitting: Family Medicine

## 2019-04-17 ENCOUNTER — Telehealth: Payer: Self-pay | Admitting: Family Medicine

## 2019-04-17 NOTE — Telephone Encounter (Signed)
Mrs. Wiggs daughter, [name], needs a letter stating that she is now the primary care giver of Mrs. Shaheen and it is advisable that she not be around students due to the condition of Mrs. Depolo.  [name] is a Pharmacist, hospital at [employer].    She can teach from home if she can get a letter from you.

## 2019-04-17 NOTE — Telephone Encounter (Signed)
Done. Not given to Briana Austin while patient was at his o.v. today.

## 2019-04-20 ENCOUNTER — Telehealth: Payer: Self-pay | Admitting: Family Medicine

## 2019-04-20 NOTE — Telephone Encounter (Signed)
Pt got a call from pharmacy on the Metformin  500 mg  She wants to know if she should continue to take this or not  Please advise  [phone]  [facility]

## 2019-04-20 NOTE — Telephone Encounter (Signed)
Mr. Sloop advised.  (On DPR)  Thanks,   -Mickel Baas

## 2019-04-20 NOTE — Telephone Encounter (Signed)
Can stop metformin for now. Sugar has been pretty good. We'll see how her sugars are doing when she comes back in in July.

## 2019-05-01 ENCOUNTER — Ambulatory Visit: Payer: Self-pay | Admitting: Family Medicine

## 2019-05-08 ENCOUNTER — Ambulatory Visit: Payer: Self-pay | Admitting: Family Medicine

## 2019-05-08 ENCOUNTER — Encounter: Payer: Self-pay | Admitting: Family Medicine

## 2019-05-08 ENCOUNTER — Ambulatory Visit (INDEPENDENT_AMBULATORY_CARE_PROVIDER_SITE_OTHER): Payer: Medicare HMO | Admitting: Family Medicine

## 2019-05-08 ENCOUNTER — Other Ambulatory Visit: Payer: Self-pay

## 2019-05-08 VITALS — BP 116/62 | HR 69 | Temp 99.0°F | Resp 16 | Wt 184.0 lb

## 2019-05-08 DIAGNOSIS — E1121 Type 2 diabetes mellitus with diabetic nephropathy: Secondary | ICD-10-CM

## 2019-05-08 DIAGNOSIS — F0390 Unspecified dementia without behavioral disturbance: Secondary | ICD-10-CM | POA: Diagnosis not present

## 2019-05-08 LAB — POCT GLYCOSYLATED HEMOGLOBIN (HGB A1C)
Est. average glucose Bld gHb Est-mCnc: 174
Hemoglobin A1C: 7.7 % — AB (ref 4.0–5.6)

## 2019-05-08 MED ORDER — METFORMIN HCL 500 MG PO TABS: 500.0000 mg | ORAL_TABLET | Freq: Every evening | ORAL | 1 refills | Status: DC

## 2019-05-08 NOTE — Progress Notes (Addendum)
Patient: Briana Austin Female    DOB: 1934-04-24   85 y.o.   MRN: [id number] Visit Date: 05/08/2019  Today's Provider: Lelon Huh, MD   Chief Complaint  Patient presents with  . Diabetes  . Hypertension   Subjective:     HPI     Diabetes Mellitus Type II, Follow-up:   Lab Results  Component Value Date   HGBA1C 6.8 (A) 07/26/2018   HGBA1C 6.6 02/28/2018   HGBA1C 7.1 10/08/2017    Last seen for diabetes 10 months ago.  Management since then includes discontinuing Metformin due to recall. She reports good compliance with treatment. She is not having side effects.  Current symptoms include none and have been stable. Home blood sugar records: fasting range: 104-120  Episodes of hypoglycemia? no   Current insulin regiment: none Most Recent Eye Exam: 08/31/2018 Weight trend: increasing steadily Prior visit with dietician: No Current exercise: none Current diet habits: well balanced  Pertinent Labs:    Component Value Date/Time   CHOL 166 02/28/2018 0912   CHOL 187 09/14/2014   TRIG 98 02/28/2018 0912   TRIG 132 09/14/2014   HDL 54 02/28/2018 0912   LDLCALC 92 02/28/2018 0912   CREATININE 1.37 (H) 02/28/2018 0912   CREATININE 1.37 (H) 10/18/2017 1600    Wt Readings from Last 3 Encounters:  05/08/19 184 lb (83.5 kg)  07/26/18 176 lb (79.8 kg)  02/28/18 174 lb (78.9 kg)    ------------------------------------------------------------------------  Hypertension, follow-up:  BP Readings from Last 3 Encounters:  05/08/19 116/62  07/26/18 105/69  02/28/18 120/64    She was last seen for hypertension 10 months ago.  BP at that visit was 105/69. Management since that visit includes no changes. She reports good compliance with treatment. She is not having side effects.  She is not exercising. She is adherent to low salt diet.   Outside blood pressures are not being checked. She is experiencing none.  Patient denies chest pain, chest  pressure/discomfort, claudication, dyspnea, exertional chest pressure/discomfort, fatigue, irregular heart beat, lower extremity edema, near-syncope, orthopnea, palpitations, paroxysmal nocturnal dyspnea, syncope and tachypnea.   Cardiovascular risk factors include advanced age (older than 18 for men, 50 for women), diabetes mellitus and hypertension.  Use of agents associated with hypertension: NSAIDS.     Weight trend: increasing steadily Wt Readings from Last 3 Encounters:  05/08/19 184 lb (83.5 kg)  07/26/18 176 lb (79.8 kg)  02/28/18 174 lb (78.9 kg)    Current diet: well balanced  ------------------------------------------------------------------------  Follow up for Dementia:  The patient was last seen for this 10 months ago. Changes made at last visit include none; medications were refilled.  She reports good compliance with treatment. She feels that condition is Unchanged. She is not having side effects.   ------------------------------------------------------------------------------------   No Known Allergies   Current Outpatient Medications:  .  Accu-Chek Softclix Lancets lancets, USE AS DIRECTED, Disp: 100 each, Rfl: 11 .  aspirin EC 81 MG tablet, Take 81 mg by mouth daily., Disp: , Rfl:  .  Blood Glucose Monitoring Suppl (ACCU-CHEK AVIVA PLUS) w/Device KIT, AS DIRECTED, Disp: 1 kit, Rfl: 0 .  calcium carbonate (OS-CAL) 600 MG TABS tablet, Take 1 tablet by mouth daily., Disp: , Rfl:  .  feeding supplement, ENSURE ENLIVE, (ENSURE ENLIVE) LIQD, Take 237 mLs by mouth 2 (two) times daily between meals., Disp: 237 mL, Rfl: 12 .  glipiZIDE (GLUCOTROL XL) 5 MG 24 hr tablet, Take 1  tablet by mouth once daily with breakfast, Disp: 90 tablet, Rfl: 1 .  glucose blood (ACCU-CHEK AVIVA PLUS) test strip, Use to check blood sugar once a day for type 2 diabetes e11.9, Disp: 100 each, Rfl: 4 .  levETIRAcetam (KEPPRA) 500 MG tablet, Take 1 tablet (500 mg total) by mouth 2 (two) times  daily., Disp: 180 tablet, Rfl: 4 .  lisinopril (PRINIVIL,ZESTRIL) 20 MG tablet, TAKE 1 TABLET BY MOUTH ONCE DAILY, Disp: 90 tablet, Rfl: 4 .  Memantine HCl-Donepezil HCl (NAMZARIC) 28-10 MG CP24, Take 1 capsule by mouth daily., Disp: 90 capsule, Rfl: 3 .  ONE TOUCH LANCETS MISC, 1 Device by Does not apply route daily., Disp: 100 each, Rfl: 4 .  potassium chloride (K-DUR,KLOR-CON) 10 MEQ tablet, TAKE 1 TABLET BY MOUTH ONCE DAILY, Disp: 30 tablet, Rfl: 12 .  pravastatin (PRAVACHOL) 40 MG tablet, TAKE 1 TABLET BY MOUTH ONCE DAILY, Disp: 90 tablet, Rfl: 4 .  vitamin B-12 1000 MCG tablet, Take 1 tablet (1,000 mcg total) by mouth daily., Disp: 30 tablet, Rfl: 1 .  Vitamin D, Ergocalciferol, (DRISDOL) 1.25 MG (50000 UT) CAPS capsule, Take 1 capsule by mouth once a week, Disp: 12 capsule, Rfl: 4 .  metFORMIN (GLUCOPHAGE-XR) 500 MG 24 hr tablet, TAKE 2 TABLETS BY MOUTH ONCE DAILY (Patient not taking: No sig reported), Disp: 180 tablet, Rfl: 3 .  montelukast (SINGULAIR) 10 MG tablet, Take 1 tablet (10 mg total) by mouth daily. (Patient not taking: Reported on 05/08/2019), Disp: 30 tablet, Rfl: 1  Review of Systems  Constitutional: Negative for appetite change, chills, fatigue and fever.  Respiratory: Negative for chest tightness and shortness of breath.   Cardiovascular: Negative for chest pain and palpitations.  Gastrointestinal: Negative for abdominal pain, nausea and vomiting.  Neurological: Negative for dizziness and weakness.    Social History   Tobacco Use  . Smoking status: Former Smoker    Packs/day: 0.50    Years: 8.00    Pack years: 4.00    Types: Cigarettes  . Smokeless tobacco: Never Used  . Tobacco comment: > 50 years ago  Substance Use Topics  . Alcohol use: No    Alcohol/week: 0.0 standard drinks      Objective:   BP 116/62 (BP Location: Left Arm, Patient Position: Sitting, Cuff Size: Large)   Pulse 69   Temp 99 F (37.2 C) (Oral)   Resp 16   Wt 184 lb (83.5 kg)   SpO2  97% Comment: room air  BMI 28.82 kg/m  Vitals:   05/08/19 1100  BP: 116/62  Pulse: 69  Resp: 16  Temp: 99 F (37.2 C)  TempSrc: Oral  SpO2: 97%  Weight: 184 lb (83.5 kg)     Physical Exam   General Appearance:    Alert, cooperative, no distress  Eyes:    PERRL, conjunctiva/corneas clear, EOM's intact       Lungs:     Clear to auscultation bilaterally, respirations unlabored  Heart:    Regular rate and rhythm  Neurologic:   Awake, alert, oriented x 3. No apparent focal neurological           defect.        Results for orders placed or performed in visit on 05/08/19  POCT HgB A1C  Result Value Ref Range   Hemoglobin A1C 7.7 (A) 4.0 - 5.6 %   HbA1c POC (<> result, manual entry)     HbA1c, POC (prediabetic range)     HbA1c, POC (controlled  diabetic range)     Est. average glucose Bld gHb Est-mCnc 174        Assessment & Plan    1. Controlled diabetes mellitus with nephropathy ([place]) Well controlled.  Continue current medications. Due to recall of ER metformin, will chang to -metFORMIN (GLUCOPHAGE) 500 MG tablet; Take 1 tablet (500 mg total) by mouth every evening.  Dispense: 90 tablet; Refill: 1   The entirety of the information documented in the History of Present Illness, Review of Systems and Physical Exam were personally obtained by me. Portions of this information were initially documented by Meyer Cory, CMA and reviewed by me for thoroughness and accuracy.      Lelon Huh, MD  [facility]

## 2019-05-10 ENCOUNTER — Other Ambulatory Visit: Payer: Self-pay | Admitting: Family Medicine

## 2019-05-15 NOTE — Patient Instructions (Signed)
.   Please review the attached list of medications and notify my office if there are any errors.   . Please bring all of your medications to every appointment so we can make sure that our medication list is the same as yours.   . We will have flu vaccines available after Labor Day. Please go to your pharmacy or call the office in early September to schedule you flu shot.   

## 2019-06-23 ENCOUNTER — Other Ambulatory Visit: Payer: Self-pay | Admitting: Family Medicine

## 2019-06-23 DIAGNOSIS — I10 Essential (primary) hypertension: Secondary | ICD-10-CM

## 2019-08-02 NOTE — Progress Notes (Signed)
Subjective:   Briana Austin is a 85 y.o. female who presents for Medicare Annual (Subsequent) preventive examination.    This visit is being conducted through telemedicine due to the COVID-19 pandemic. This patient has given me verbal consent via doximity to conduct this visit, patient states they are participating from their home address. Some vital signs may be absent or patient reported.    Patient identification: identified by name, DOB, and current address  Review of Systems:  N/A  Cardiac Risk Factors include: advanced age (>52mn, >>15women);diabetes mellitus;dyslipidemia;hypertension     Objective:     Vitals: There were no vitals taken for this visit.  There is no height or weight on file to calculate BMI. Unable to obtain vitals due to visit being conducted via telephonically.   Advanced Directives 08/03/2019 04/02/2017 03/09/2017 07/05/2015 06/08/2015 06/08/2015  Does Patient Have a Medical Advance Directive? No Yes Yes Yes Yes No;Yes  Type of Advance Directive - - Healthcare Power of [place] Does patient want to make changes to medical advance directive? - - - No - Patient declined No - Patient declined -  Copy of [place]? - - No - copy requested No - copy requested No - copy requested No - copy requested  Would patient like information on creating a medical advance directive? No - Patient declined - - No - patient declined information No - patient declined information No - patient declined information    Tobacco Social History   Tobacco Use  Smoking Status Former Smoker  . Packs/day: 0.50  . Years: 8.00  . Pack years: 4.00  . Types: Cigarettes  Smokeless Tobacco Never Used  Tobacco Comment   > 50 years ago     Counseling given: Not Answered Comment: > 50 years ago   Clinical Intake:  Pre-visit preparation completed: Yes  Pain : No/denies pain  Pain Score: 0-No pain     Nutritional Risks: None Diabetes: Yes  How often do you need to have someone help you when you read instructions, pamphlets, or other written materials from your doctor or pharmacy?: 1 - Never   Diabetes:  Is the patient diabetic?  Yes type 2 If diabetic, was a CBG obtained today?  No  Did the patient bring in their glucometer from home?  No  How often do you monitor your CBG's? Once a day.   Financial Strains and Diabetes Management:  Are you having any financial strains with the device, your supplies or your medication? No .  Does the patient want to be seen by Chronic Care Management for management of their diabetes?  No  Would the patient like to be referred to a Nutritionist or for Diabetic Management?  No   Diabetic Exams:  Diabetic Eye Exam: Completed 08/31/18. Repeat yearly.  Diabetic Foot Exam: Completed 04/26/12. Pt has been advised about the importance in completing this exam. Note made to follow up on this at next in office visit.     Interpreter Needed?: No  Information [facility] :: [facility]  Past Medical History:  Diagnosis Date  . Breast cancer ([facility] 2005   positive, radiation  . CAP (community acquired pneumonia) 06/14/2015  . [place]   . Diabetes mellitus without complication ([place]   . History of breast cancer   . Hypertension   . Personal history of radiation therapy   . Seizure ([place]  Past Surgical History:  Procedure Laterality Date  . ABDOMINAL HYSTERECTOMY    . BREAST BIOPSY Right    negative 2011  . BREAST EXCISIONAL BIOPSY Right 2005   positive   . BREAST LUMPECTOMY Right 2005   Carcinoma in situ of right breast  . sigmoid polyp excision  11/24/2004   Hyperplastic polyp  . TOTAL ABDOMINAL HYSTERECTOMY W/ BILATERAL SALPINGOOPHORECTOMY  1981   Benign   Family History  Problem Relation Age of Onset  . Stroke Mother   . Dementia Brother   . Breast cancer Neg Hx    Social History    Socioeconomic History  . Marital status: Married    Spouse name: Not on file  . Number of children: 1  . Years of education: HS Grad  . Highest education level: High school graduate  Occupational History  . Occupation: Retired    Comment: Designer, multimedia  Social Needs  . Financial resource strain: Not hard at all  . Food insecurity    Worry: Never true    Inability: Never true  . Transportation needs    Medical: No    Non-medical: No  Tobacco Use  . Smoking status: Former Smoker    Packs/day: 0.50    Years: 8.00    Pack years: 4.00    Types: Cigarettes  . Smokeless tobacco: Never Used  . Tobacco comment: > 50 years ago  Substance and Sexual Activity  . Alcohol use: No    Alcohol/week: 0.0 standard drinks  . Drug use: No  . Sexual activity: Never  Lifestyle  . Physical activity    Days per week: 0 days    Minutes per session: 0 min  . Stress: Not at all  Relationships  . Social Herbalist on phone: Patient refused    Gets together: Patient refused    Attends religious service: Patient refused    Active member of club or organization: Patient refused    Attends meetings of clubs or organizations: Patient refused    Relationship status: Patient refused  Other Topics Concern  . Not on file  Social History Narrative   Lives at home with her husband.   Right-handed.   1 cup caffeine daily.    Outpatient Encounter Medications as of 08/03/2019  Medication Sig  . Accu-Chek Softclix Lancets lancets USE AS DIRECTED  . aspirin EC 81 MG tablet Take 81 mg by mouth daily.  . Blood Glucose Monitoring Suppl (ACCU-CHEK AVIVA PLUS) w/Device KIT AS DIRECTED  . calcium carbonate (OS-CAL) 600 MG TABS tablet Take 1 tablet by mouth daily.  Marland Kitchen glipiZIDE (GLUCOTROL XL) 5 MG 24 hr tablet Take 1 tablet by mouth once daily with breakfast  . glucose blood (ACCU-CHEK AVIVA PLUS) test strip Use to check blood sugar once a day for type 2 diabetes e11.9  . levETIRAcetam (KEPPRA)  500 MG tablet Take 1 tablet (500 mg total) by mouth 2 (two) times daily.  Marland Kitchen lisinopril (ZESTRIL) 20 MG tablet Take 1 tablet by mouth once daily  . Memantine HCl-Donepezil HCl (NAMZARIC) 28-10 MG CP24 Take 1 capsule by mouth daily.  . metFORMIN (GLUCOPHAGE) 500 MG tablet Take 1 tablet (500 mg total) by mouth every evening.  . ONE TOUCH LANCETS MISC 1 Device by Does not apply route daily.  . potassium chloride (K-DUR,KLOR-CON) 10 MEQ tablet TAKE 1 TABLET BY MOUTH ONCE DAILY  . pravastatin (PRAVACHOL) 40 MG tablet Take 1 tablet by mouth once daily  . vitamin  B-12 1000 MCG tablet Take 1 tablet (1,000 mcg total) by mouth daily.  . Vitamin D, Ergocalciferol, (DRISDOL) 1.25 MG (50000 UT) CAPS capsule Take 1 capsule by mouth once a week  . feeding supplement, ENSURE ENLIVE, (ENSURE ENLIVE) LIQD Take 237 mLs by mouth 2 (two) times daily between meals. (Patient not taking: Reported on 08/03/2019)  . montelukast (SINGULAIR) 10 MG tablet Take 1 tablet (10 mg total) by mouth daily. (Patient not taking: Reported on 05/08/2019)   No facility-administered encounter medications on file as of 08/03/2019.     Activities of Daily Living In your present state of health, do you have any difficulty performing the following activities: 08/03/2019  Hearing? N  Vision? N  Comment Wears eye glasses daily.  Difficulty concentrating or making decisions? Y  Comment On medication for memory loss.  Walking or climbing stairs? Y  Comment Due to balancing issues.  Dressing or bathing? Y  Comment Has assistance bathing.  Doing errands, shopping? Y  Comment Does not drive.  Preparing Food and eating ? Y  Comment Does not cook.  Using the Toilet? N  In the past six months, have you accidently leaked urine? Y  Comment Occasionally, wears protection.  Do you have problems with loss of bowel control? N  Managing your Medications? Y  Comment Husband takes care of medications.  Managing your Finances? Y  Comment Husband  manages finances.  Housekeeping or managing your Housekeeping? Y  Comment Husband manages the housework.  Some recent data might be hidden    Patient Care Team: Birdie Sons, MD as PCP - General (Family Medicine) Dingeldein, Remo Lipps, MD as Consulting Physician (Ophthalmology)    Assessment:   This is a routine wellness examination for [place].  Exercise Activities and Dietary recommendations Current Exercise Habits: The patient does not participate in regular exercise at present, Exercise limited by: orthopedic condition(s);psychological condition(s)  Goals    . Increase water intake     Recommend increasing water intake to 4 glasses of water every day.       Fall Risk: Fall Risk  08/03/2019 07/26/2018 04/02/2017 03/09/2017 10/18/2015  Falls in the past year? 0 No No Yes No  Number falls in past yr: 0 - - 1 -  Injury with Fall? 0 - - - -  Follow up - - - Falls prevention discussed -    FALL RISK PREVENTION PERTAINING TO THE HOME:  Any stairs in or around the home? Yes  If so, are there any without handrails? No   Home free of loose throw rugs in walkways, pet beds, electrical cords, etc? Yes  Adequate lighting in your home to reduce risk of falls? Yes   ASSISTIVE DEVICES UTILIZED TO PREVENT FALLS:  Life alert? No  Use of a cane, walker or w/c? No  Grab bars in the bathroom? Yes  Shower chair or bench in shower? Yes  Elevated toilet seat or a handicapped toilet? Yes   TIMED UP AND GO:  Was the test performed? No .    Depression Screen: Declined today.   PHQ 2/9 Scores 08/03/2019 07/26/2018 07/09/2017 04/02/2017  PHQ - 2 Score 0 _0 PHQ- 9 Score - _1 Cognitive Function MMSE - Mini Mental State Exam 05/06/2016 10/29/2015  Orientation to time 0 2  Orientation to Place 3 3  Registration 3 3  Attention/ Calculation 3 3  Recall 0 0  Language- name 2 objects 2 2  Language-  repeat 1 0  Language- follow 3 step command 3 2  Language- read & follow direction 1  1  Write a sentence 1 1  Copy design 1 0  Total score 18 17        Immunization History  Administered Date(s) Administered  . Influenza, High Dose Seasonal PF 07/26/2014, 08/13/2015, 08/22/2016, 07/09/2017, 07/26/2018  . Pneumococcal Conjugate-13 04/11/2014  . Pneumococcal Polysaccharide-23 09/21/2002    Qualifies for Shingles Vaccine? Yes . Due for Shingrix. Education has been provided regarding the importance of this vaccine. Pt has been advised to call insurance company to determine out of pocket expense. Advised may also receive vaccine at local pharmacy or Health Dept. Verbalized acceptance and understanding.  Tdap: Although this vaccine is not a covered service during a Wellness Exam, does the patient still wish to receive this vaccine today?  No .   Flu Vaccine: Due for Flu vaccine. Does the patient want to receive this vaccine today?  No .   Pneumococcal Vaccine: Completed series  Screening Tests Health Maintenance  Topic Date Due  . FOOT EXAM  01/22/1944  . INFLUENZA VACCINE  06/03/2019  . TETANUS/TDAP  11/02/2026 (Originally 01/23/1953)  . OPHTHALMOLOGY EXAM  09/01/2019  . HEMOGLOBIN A1C  11/08/2019  . DEXA SCAN  10/30/2020  . PNA vac Low Risk Adult  Completed    Cancer Screenings:  Colorectal Screening: No longer required.   Mammogram: No longer required.   Bone Density: Completed 10/31/15. Results reflect OSTEOPENIA. Repeat every 5 years.   Lung Cancer Screening: (Low Dose CT Chest recommended if Age 40-80 years, 30 pack-year currently smoking OR have quit w/in 15years.) does not qualify.   Additional Screening:  Dental Screening: Recommended annual dental exams for proper oral hygiene   Community Resource Referral:  CRR required this visit?  No       Plan:  I have personally reviewed and addressed the Medicare Annual Wellness questionnaire and have noted the following in the patient's chart:  A. Medical and social history B. Use of alcohol,  tobacco or illicit drugs  C. Current medications and supplements D. Functional ability and status E.  Nutritional status F.  Physical activity G. Advance directives H. List of other physicians I.  Hospitalizations, surgeries, and ER visits in previous 12 months J.  [place] such as hearing and vision if needed, cognitive and depression L. Referrals and appointments   In addition, I have reviewed and discussed with patient certain preventive protocols, quality metrics, and best practice recommendations. A written personalized care plan for preventive services as well as general preventive health recommendations were provided to patient.   [place]endora Score, [place]  90/01/8332 Nurse Health Advisor   Nurse Notes: Pt needs a diabetic foot exam and influenza vaccine at next in office visit.

## 2019-08-03 ENCOUNTER — Other Ambulatory Visit: Payer: Self-pay

## 2019-08-03 ENCOUNTER — Ambulatory Visit (INDEPENDENT_AMBULATORY_CARE_PROVIDER_SITE_OTHER): Payer: Medicare HMO

## 2019-08-03 DIAGNOSIS — Z0000 Encounter for general adult medical examination without abnormal findings: Secondary | ICD-10-CM | POA: Diagnosis not present

## 2019-08-03 NOTE — Patient Instructions (Addendum)
[name] , Thank you for taking time to come for your Medicare Wellness Visit. I appreciate your ongoing commitment to your health goals. Please review the following plan we discussed and let me know if I can assist you in the future.   Screening recommendations/referrals: Colonoscopy: No longer required.  Mammogram: No longer required.  Bone Density: Up to date, due 10/2020 Recommended yearly ophthalmology/optometry visit for glaucoma screening and checkup Recommended yearly dental visit for hygiene and checkup  Vaccinations: Influenza vaccine: Currently due Pneumococcal vaccine: Completed series Tdap vaccine: Pt declines today.  Shingles vaccine: Pt declines today.     Advanced directives: Advance directive discussed with you today. Even though you declined this today please call our office should you change your mind and we can give you the proper paperwork for you to fill out.  Conditions/risks identified: Continue to increase water intake to 6-8 8 oz glasses daily.  Next appointment: 08/29/19 @ 11:00 AM with Dr Caryn Section. Declined scheduling an AWV for 2021 at this time.    Preventive Care 19 Years and Older, Female Preventive care refers to lifestyle choices and visits with your health care provider that can promote health and wellness. What does preventive care include?  A yearly physical exam. This is also called an annual well check.  Dental exams once or twice a year.  Routine eye exams. Ask your health care provider how often you should have your eyes checked.  Personal lifestyle choices, including:  Daily care of your teeth and gums.  Regular physical activity.  Eating a healthy diet.  Avoiding tobacco and drug use.  Limiting alcohol use.  Practicing safe sex.  Taking low-dose aspirin every day.  Taking vitamin and mineral supplements as recommended by your health care provider. What happens during an annual well check? The services and screenings done  by your health care provider during your annual well check will depend on your age, overall health, lifestyle risk factors, and family history of disease. Counseling  Your health care provider may ask you questions about your:  Alcohol use.  Tobacco use.  Drug use.  Emotional well-being.  Home and relationship well-being.  Sexual activity.  Eating habits.  History of falls.  Memory and ability to understand (cognition).  Work and work Statistician.  Reproductive health. Screening  You may have the following tests or measurements:  Height, weight, and BMI.  Blood pressure.  Lipid and cholesterol levels. These may be checked every 5 years, or more frequently if you are over 40 years old.  Skin check.  Lung cancer screening. You may have this screening every year starting at age 49 if you have a 30-pack-year history of smoking and currently smoke or have quit within the past 15 years.  Fecal occult blood test (FOBT) of the stool. You may have this test every year starting at age 56.  Flexible sigmoidoscopy or colonoscopy. You may have a sigmoidoscopy every 5 years or a colonoscopy every 10 years starting at age 47.  Hepatitis C blood test.  Hepatitis B blood test.  Sexually transmitted disease (STD) testing.  Diabetes screening. This is done by checking your blood sugar (glucose) after you have not eaten for a while (fasting). You may have this done every 1-3 years.  Bone density scan. This is done to screen for osteoporosis. You may have this done starting at age 44.  Mammogram. This may be done every 1-2 years. Talk to your health care provider about how often you should have  regular mammograms. Talk with your health care provider about your test results, treatment options, and if necessary, the need for more tests. Vaccines  Your health care provider may recommend certain vaccines, such as:  Influenza vaccine. This is recommended every year.  Tetanus,  diphtheria, and acellular pertussis (Tdap, Td) vaccine. You may need a Td booster every 10 years.  Zoster vaccine. You may need this after age 85.  Pneumococcal 13-valent conjugate (PCV13) vaccine. One dose is recommended after age 39.  Pneumococcal polysaccharide (PPSV23) vaccine. One dose is recommended after age 59. Talk to your health care provider about which screenings and vaccines you need and how often you need them. This information is not intended to replace advice given to you by your health care provider. Make sure you discuss any questions you have with your health care provider. Document Released: 11/15/2015 Document Revised: 07/08/2016 Document Reviewed: 08/20/2015 Elsevier Interactive Patient Education  2017 [place] Prevention in the Home Falls can cause injuries. They can happen to people of all ages. There are many things you can do to make your home safe and to help prevent falls. What can I do on the outside of my home?  Regularly fix the edges of walkways and driveways and fix any cracks.  Remove anything that might make you trip as you walk through a door, such as a raised step or threshold.  Trim any bushes or trees on the path to your home.  Use bright outdoor lighting.  Clear any walking paths of anything that might make someone trip, such as rocks or tools.  Regularly check to see if handrails are loose or broken. Make sure that both sides of any steps have handrails.  Any raised decks and porches should have guardrails on the edges.  Have any leaves, snow, or ice cleared regularly.  Use sand or salt on walking paths during winter.  Clean up any spills in your garage right away. This includes oil or grease spills. What can I do in the bathroom?  Use night lights.  Install grab bars by the toilet and in the tub and shower. Do not use towel bars as grab bars.  Use non-skid mats or decals in the tub or shower.  If you need to sit down in  the shower, use a plastic, non-slip stool.  Keep the floor dry. Clean up any water that spills on the floor as soon as it happens.  Remove soap buildup in the tub or shower regularly.  Attach bath mats securely with double-sided non-slip rug tape.  Do not have throw rugs and other things on the floor that can make you trip. What can I do in the bedroom?  Use night lights.  Make sure that you have a light by your bed that is easy to reach.  Do not use any sheets or blankets that are too big for your bed. They should not hang down onto the floor.  Have a firm chair that has side arms. You can use this for support while you get dressed.  Do not have throw rugs and other things on the floor that can make you trip. What can I do in the kitchen?  Clean up any spills right away.  Avoid walking on wet floors.  Keep items that you use a lot in easy-to-reach places.  If you need to reach something above you, use a strong step stool that has a grab bar.  Keep electrical cords out of the  way.  Do not use floor polish or wax that makes floors slippery. If you must use wax, use non-skid floor wax.  Do not have throw rugs and other things on the floor that can make you trip. What can I do with my stairs?  Do not leave any items on the stairs.  Make sure that there are handrails on both sides of the stairs and use them. Fix handrails that are broken or loose. Make sure that handrails are as long as the stairways.  Check any carpeting to make sure that it is firmly attached to the stairs. Fix any carpet that is loose or worn.  Avoid having throw rugs at the top or bottom of the stairs. If you do have throw rugs, attach them to the floor with carpet tape.  Make sure that you have a light switch at the top of the stairs and the bottom of the stairs. If you do not have them, ask someone to add them for you. What else can I do to help prevent falls?  Wear shoes that:  Do not have high  heels.  Have rubber bottoms.  Are comfortable and fit you well.  Are closed at the toe. Do not wear sandals.  If you use a stepladder:  Make sure that it is fully opened. Do not climb a closed stepladder.  Make sure that both sides of the stepladder are locked into place.  Ask someone to hold it for you, if possible.  Clearly mark and make sure that you can see:  Any grab bars or handrails.  First and last steps.  Where the edge of each step is.  Use tools that help you move around (mobility aids) if they are needed. These include:  Canes.  Walkers.  Scooters.  Crutches.  Turn on the lights when you go into a dark area. Replace any light bulbs as soon as they burn out.  Set up your furniture so you have a clear path. Avoid moving your furniture around.  If any of your floors are uneven, fix them.  If there are any pets around you, be aware of where they are.  Review your medicines with your doctor. Some medicines can make you feel dizzy. This can increase your chance of falling. Ask your doctor what other things that you can do to help prevent falls. This information is not intended to replace advice given to you by your health care provider. Make sure you discuss any questions you have with your health care provider. Document Released: 08/15/2009 Document Revised: 03/26/2016 Document Reviewed: 11/23/2014 Elsevier Interactive Patient Education  2017 [employer].

## 2019-08-09 ENCOUNTER — Other Ambulatory Visit: Payer: Self-pay | Admitting: Family Medicine

## 2019-08-09 DIAGNOSIS — E119 Type 2 diabetes mellitus without complications: Secondary | ICD-10-CM

## 2019-08-29 ENCOUNTER — Encounter: Payer: Self-pay | Admitting: Family Medicine

## 2019-08-29 ENCOUNTER — Other Ambulatory Visit: Payer: Self-pay | Admitting: Family Medicine

## 2019-08-29 ENCOUNTER — Other Ambulatory Visit: Payer: Self-pay

## 2019-08-29 ENCOUNTER — Ambulatory Visit (INDEPENDENT_AMBULATORY_CARE_PROVIDER_SITE_OTHER): Payer: Medicare HMO | Admitting: Family Medicine

## 2019-08-29 VITALS — BP 118/60 | HR 81 | Temp 96.8°F | Resp 16 | Wt 187.0 lb

## 2019-08-29 DIAGNOSIS — D649 Anemia, unspecified: Secondary | ICD-10-CM | POA: Diagnosis not present

## 2019-08-29 DIAGNOSIS — F0390 Unspecified dementia without behavioral disturbance: Secondary | ICD-10-CM | POA: Diagnosis not present

## 2019-08-29 DIAGNOSIS — Z23 Encounter for immunization: Secondary | ICD-10-CM

## 2019-08-29 DIAGNOSIS — E1121 Type 2 diabetes mellitus with diabetic nephropathy: Secondary | ICD-10-CM

## 2019-08-29 DIAGNOSIS — I10 Essential (primary) hypertension: Secondary | ICD-10-CM | POA: Diagnosis not present

## 2019-08-29 DIAGNOSIS — E559 Vitamin D deficiency, unspecified: Secondary | ICD-10-CM

## 2019-08-29 DIAGNOSIS — E7849 Other hyperlipidemia: Secondary | ICD-10-CM | POA: Diagnosis not present

## 2019-08-29 LAB — POCT GLYCOSYLATED HEMOGLOBIN (HGB A1C)
Est. average glucose Bld gHb Est-mCnc: 169
Hemoglobin A1C: 7.5 % — AB (ref 4.0–5.6)

## 2019-08-29 NOTE — Patient Instructions (Signed)
.   Please review the attached list of medications and notify my office if there are any errors.   . Please bring all of your medications to every appointment so we can make sure that our medication list is the same as yours.   . It is especially important to get the annual flu vaccine this year. If you haven't had it already, please go to your pharmacy or call the office as soon as possible to schedule you flu shot.  

## 2019-08-29 NOTE — Progress Notes (Signed)
Patient: Briana Austin Female    DOB: 04/29/1934   85 y.o.   MRN: [id number] Visit Date: 08/29/2019  Today's Provider: Lelon Huh, MD   Chief Complaint  Patient presents with  . Diabetes  . Hypertension   Subjective:     HPI  Diabetes Mellitus Type II, Follow-up:   Lab Results  Component Value Date   HGBA1C 7.7 (A) 05/08/2019   HGBA1C 6.8 (A) 07/26/2018   HGBA1C 6.6 02/28/2018    Last seen for diabetes 3 months ago.  Management since then includes changing Metformin ER to Metformin due to recall. She reports good compliance with treatment. She is not having side effects.  Current symptoms include none and have been stable. Home blood sugar records: fasting range: 120-130  Episodes of hypoglycemia? no   Current insulin regiment: Is not on insulin Most Recent Eye Exam: UTD Weight trend: stable Prior visit with dietician: No Current exercise: none Current diet habits: well balanced  Pertinent Labs:    Component Value Date/Time   CHOL 166 02/28/2018 0912   CHOL 187 09/14/2014   TRIG 98 02/28/2018 0912   TRIG 132 09/14/2014   HDL 54 02/28/2018 0912   LDLCALC 92 02/28/2018 0912   CREATININE 1.37 (H) 02/28/2018 0912   CREATININE 1.37 (H) 10/18/2017 1600    Wt Readings from Last 3 Encounters:  08/29/19 187 lb (84.8 kg)  05/08/19 184 lb (83.5 kg)  07/26/18 176 lb (79.8 kg)    ------------------------------------------------------------------------  Hypertension, follow-up:  BP Readings from Last 3 Encounters:  08/29/19 118/60  05/08/19 116/62  07/26/18 105/69    She was last seen for hypertension 1 years ago.  BP at that visit was 105/69. Management since that visit includes no changes. She reports good compliance with treatment. She is not having side effects.  She is not exercising. She is adherent to low salt diet.   Outside blood pressures are not being checked. She is experiencing none.  Patient denies chest pain, chest  pressure/discomfort, claudication, dyspnea, exertional chest pressure/discomfort, fatigue, irregular heart beat, lower extremity edema, near-syncope, orthopnea, palpitations, paroxysmal nocturnal dyspnea, syncope and tachypnea.   Cardiovascular risk factors include advanced age (older than 21 for men, 25 for women), diabetes mellitus and hypertension.  Use of agents associated with hypertension: NSAIDS.     Weight trend: fluctuating a bit Wt Readings from Last 3 Encounters:  08/29/19 187 lb (84.8 kg)  05/08/19 184 lb (83.5 kg)  07/26/18 176 lb (79.8 kg)    Current diet: well balanced  ------------------------------------------------------------------------  Follow up for Dementia:  The patient was last seen for this 3 months ago. Changes made at last visit include none.  She reports good compliance with treatment. She feels that condition is Unchanged. She is not having side effects.   ------------------------------------------------------------------------------------  No Known Allergies   Current Outpatient Medications:  .  Accu-Chek Softclix Lancets lancets, USE AS DIRECTED, Disp: 100 each, Rfl: 11 .  aspirin EC 81 MG tablet, Take 81 mg by mouth daily., Disp: , Rfl:  .  Blood Glucose Monitoring Suppl (ACCU-CHEK AVIVA PLUS) w/Device KIT, AS DIRECTED, Disp: 1 kit, Rfl: 0 .  calcium carbonate (OS-CAL) 600 MG TABS tablet, Take 1 tablet by mouth daily., Disp: , Rfl:  .  feeding supplement, ENSURE ENLIVE, (ENSURE ENLIVE) LIQD, Take 237 mLs by mouth 2 (two) times daily between meals., Disp: 237 mL, Rfl: 12 .  glipiZIDE (GLUCOTROL XL) 5 MG 24 hr tablet, Take 1  tablet by mouth once daily with breakfast, Disp: 90 tablet, Rfl: 4 .  glucose blood (ACCU-CHEK AVIVA PLUS) test strip, Use to check blood sugar once a day for type 2 diabetes e11.9, Disp: 100 each, Rfl: 4 .  levETIRAcetam (KEPPRA) 500 MG tablet, Take 1 tablet by mouth twice daily, Disp: 180 tablet, Rfl: 4 .  lisinopril  (ZESTRIL) 20 MG tablet, Take 1 tablet by mouth once daily, Disp: 90 tablet, Rfl: 4 .  Memantine HCl-Donepezil HCl (NAMZARIC) 28-10 MG CP24, Take 1 capsule by mouth daily., Disp: 90 capsule, Rfl: 3 .  metFORMIN (GLUCOPHAGE) 500 MG tablet, Take 1 tablet (500 mg total) by mouth every evening., Disp: 90 tablet, Rfl: 1 .  montelukast (SINGULAIR) 10 MG tablet, Take 1 tablet (10 mg total) by mouth daily., Disp: 30 tablet, Rfl: 1 .  ONE TOUCH LANCETS MISC, 1 Device by Does not apply route daily., Disp: 100 each, Rfl: 4 .  potassium chloride (K-DUR,KLOR-CON) 10 MEQ tablet, TAKE 1 TABLET BY MOUTH ONCE DAILY, Disp: 30 tablet, Rfl: 12 .  pravastatin (PRAVACHOL) 40 MG tablet, Take 1 tablet by mouth once daily, Disp: 90 tablet, Rfl: 4 .  vitamin B-12 1000 MCG tablet, Take 1 tablet (1,000 mcg total) by mouth daily., Disp: 30 tablet, Rfl: 1 .  Vitamin D, Ergocalciferol, (DRISDOL) 1.25 MG (50000 UT) CAPS capsule, Take 1 capsule by mouth once a week, Disp: 12 capsule, Rfl: 4  Review of Systems  Constitutional: Negative for appetite change, chills, fatigue and fever.  Respiratory: Negative for chest tightness and shortness of breath.   Cardiovascular: Negative for chest pain and palpitations.  Gastrointestinal: Negative for abdominal pain, nausea and vomiting.  Neurological: Negative for dizziness and weakness.    Social History   Tobacco Use  . Smoking status: Former Smoker    Packs/day: 0.50    Years: 8.00    Pack years: 4.00    Types: Cigarettes  . Smokeless tobacco: Never Used  . Tobacco comment: > 50 years ago  Substance Use Topics  . Alcohol use: No    Alcohol/week: 0.0 standard drinks      Objective:   BP 118/60 (BP Location: Left Arm, Patient Position: Sitting, Cuff Size: Large)   Pulse 81   Temp (!) 96.8 F (36 C) (Temporal)   Resp 16   Wt 187 lb (84.8 kg)   SpO2 96% Comment: room air  BMI 29.29 kg/m    Physical Exam   General Appearance:    Well developed, well nourished  female in no acute distress  Eyes:    PERRL, conjunctiva/corneas clear, EOM's intact       Lungs:     Clear to auscultation bilaterally, respirations unlabored  Heart:    Normal heart rate. Normal rhythm. No murmurs, rubs, or gallops.   MS:   All extremities are intact.   Neurologic:   Awake, alert, oriented x 3. No apparent focal neurological           defect.        Results for orders placed or performed in visit on 08/29/19  POCT HgB A1C  Result Value Ref Range   Hemoglobin A1C 7.5 (A) 4.0 - 5.6 %   Est. average glucose Bld gHb Est-mCnc 169        Assessment & Plan    1. Controlled diabetes mellitus with nephropathy ([place]) Continue current medications.    2. Need for influenza vaccination  - Flu Vaccine QUAD High Dose(Fluad)  3. Essential hypertension  Well controlled.  Continue current medications.    4. Other hyperlipidemia She is tolerating pravastatin well with no adverse effects.   - Comprehensive metabolic panel - Lipid panel  5. Vitamin D deficiency  - VITAMIN D 25 Hydroxy (Vit-D Deficiency, Fractures)  6. Normocytic anemia  - CBC  The entirety of the information documented in the History of Present Illness, Review of Systems and Physical Exam were personally obtained by me. Portions of this information were initially documented by Meyer Cory, CMA and reviewed by me for thoroughness and accuracy.   Follow up 4 months     Lelon Huh, MD  [facility]

## 2019-08-30 LAB — COMPREHENSIVE METABOLIC PANEL
ALT: 15 IU/L (ref 0–32)
AST: 12 IU/L (ref 0–40)
Albumin/Globulin Ratio: 1.1 — ABNORMAL LOW (ref 1.2–2.2)
Albumin: 4 g/dL (ref 3.6–4.6)
Alkaline Phosphatase: 77 IU/L (ref 39–117)
BUN/Creatinine Ratio: 15 (ref 12–28)
BUN: 20 mg/dL (ref 8–27)
Bilirubin Total: <0.2 mg/dL (ref 0.0–1.2)
CO2: 23 mmol/L (ref 20–29)
Calcium: 10.5 mg/dL — ABNORMAL HIGH (ref 8.7–10.3)
Chloride: 99 mmol/L (ref 96–106)
Creatinine, Ser: 1.35 mg/dL — ABNORMAL HIGH (ref 0.57–1.00)
GFR calc Af Amer: 42 mL/min/{1.73_m2} — ABNORMAL LOW (ref >59)
GFR calc non Af Amer: 36 mL/min/{1.73_m2} — ABNORMAL LOW (ref >59)
Globulin, Total: 3.6 g/dL (ref 1.5–4.5)
Glucose: 212 mg/dL — ABNORMAL HIGH (ref 65–99)
Potassium: 4.6 mmol/L (ref 3.5–5.2)
Sodium: 135 mmol/L (ref 134–144)
Total Protein: 7.6 g/dL (ref 6.0–8.5)

## 2019-08-30 LAB — CBC
Hematocrit: 30.2 % — ABNORMAL LOW (ref 34.0–46.6)
Hemoglobin: 10 g/dL — ABNORMAL LOW (ref 11.1–15.9)
MCH: 31.3 pg (ref 26.6–33.0)
MCHC: 33.1 g/dL (ref 31.5–35.7)
MCV: 95 fL (ref 79–97)
Platelets: 234 10*3/uL (ref 150–450)
RBC: 3.19 x10E6/uL — ABNORMAL LOW (ref 3.77–5.28)
RDW: 12.6 % (ref 11.7–15.4)
WBC: 7.5 10*3/uL (ref 3.4–10.8)

## 2019-08-30 LAB — LIPID PANEL
Chol/HDL Ratio: 3.8 ratio (ref 0.0–4.4)
Cholesterol, Total: 157 mg/dL (ref 100–199)
HDL: 41 mg/dL (ref >39)
LDL Chol Calc (NIH): 87 mg/dL (ref 0–99)
Triglycerides: 166 mg/dL — ABNORMAL HIGH (ref 0–149)
VLDL Cholesterol Cal: 29 mg/dL (ref 5–40)

## 2019-08-30 LAB — VITAMIN D 25 HYDROXY (VIT D DEFICIENCY, FRACTURES): Vit D, 25-Hydroxy: 51.5 ng/mL (ref 30.0–100.0)

## 2019-09-05 DIAGNOSIS — E119 Type 2 diabetes mellitus without complications: Secondary | ICD-10-CM | POA: Diagnosis not present

## 2019-09-05 LAB — HM DIABETES EYE EXAM

## 2019-09-06 ENCOUNTER — Other Ambulatory Visit: Payer: Self-pay | Admitting: Family Medicine

## 2019-09-06 MED ORDER — POTASSIUM CHLORIDE CRYS ER 10 MEQ PO TBCR: 10.0000 meq | EXTENDED_RELEASE_TABLET | Freq: Every day | ORAL | 3 refills | Status: DC

## 2019-09-06 NOTE — Telephone Encounter (Signed)
[facility] faxed refill request for the following medications:  1. potassium chloride (K-DUR,KLOR-CON) 10 MEQ tablet  2. Memantine HCl-Donepezil HCl (NAMZARIC) 28-10 MG CP24   90 day supply Last Rx: 09/15/2018 LOV: 08/29/2019 Please advise. Thanks TNP

## 2019-10-11 ENCOUNTER — Other Ambulatory Visit: Payer: Self-pay | Admitting: Family Medicine

## 2019-10-11 DIAGNOSIS — E1121 Type 2 diabetes mellitus with diabetic nephropathy: Secondary | ICD-10-CM

## 2019-10-11 NOTE — Telephone Encounter (Signed)
Requested Prescriptions  Pending Prescriptions Disp Refills  . metFORMIN (GLUCOPHAGE) 500 MG tablet [Pharmacy Med Name: metFORMIN HCl 500 MG Oral Tablet] 90 tablet 0    Sig: Take 1 tablet by mouth in the evening     Endocrinology:  Diabetes - Biguanides Failed - 10/11/2019 10:37 AM      Failed - Cr in normal range and within 360 days    Creat  Date Value Ref Range Status  10/18/2017 1.37 (H) 0.60 - 0.88 mg/dL Final    Comment:    For patients >33 years of age, the reference limit for Creatinine is approximately 13% higher for people identified as African-American. .    Creatinine, Ser  Date Value Ref Range Status  08/29/2019 1.35 (H) 0.57 - 1.00 mg/dL Final         Failed - eGFR in normal range and within 360 days    GFR, Est African American  Date Value Ref Range Status  10/18/2017 42 (L) > OR = 60 mL/min/1.2m Final   GFR calc Af Amer  Date Value Ref Range Status  08/29/2019 42 (L) >59 mL/min/1.73 Final   GFR, Est Non African American  Date Value Ref Range Status  10/18/2017 36 (L) > OR = 60 mL/min/1.772mFinal   GFR calc non Af Amer  Date Value Ref Range Status  08/29/2019 36 (L) >59 mL/min/1.73 Final         Passed - HBA1C is between 0 and 7.9 and within 180 days    Hemoglobin A1C  Date Value Ref Range Status  08/29/2019 7.5 (A) 4.0 - 5.6 % Final  01/10/2015 6.5  Final   Hgb A1c MFr Bld  Date Value Ref Range Status  04/02/2017 8.3 (H) 4.8 - 5.6 % Final    Comment:             Pre-diabetes: 5.7 - 6.4          Diabetes: >6.4          Glycemic control for adults with diabetes: <7.0          Passed - Valid encounter within last 6 months    Recent Outpatient Visits          [facility]   5 months [facility] with [facility]  [facility]   1 year ago Medicare annual wellness visit,  subsequent   [facility]   1 year ago Essential hypertension   [facility]   1 year ago Allergic rhinitis due to pollen, unspecified seasonality   [facility]      Future Appointments            In 2 months Fisher, Do[facility]ie [facility]

## 2020-01-01 ENCOUNTER — Other Ambulatory Visit: Payer: Self-pay

## 2020-01-01 ENCOUNTER — Encounter: Payer: Self-pay | Admitting: Family Medicine

## 2020-01-01 ENCOUNTER — Ambulatory Visit (INDEPENDENT_AMBULATORY_CARE_PROVIDER_SITE_OTHER): Payer: Medicare HMO | Admitting: Family Medicine

## 2020-01-01 VITALS — BP 118/60 | HR 78 | Temp 97.3°F | Resp 16 | Wt 189.0 lb

## 2020-01-01 DIAGNOSIS — J301 Allergic rhinitis due to pollen: Secondary | ICD-10-CM | POA: Diagnosis not present

## 2020-01-01 DIAGNOSIS — F0280 Dementia in other diseases classified elsewhere without behavioral disturbance: Secondary | ICD-10-CM

## 2020-01-01 DIAGNOSIS — E1121 Type 2 diabetes mellitus with diabetic nephropathy: Secondary | ICD-10-CM

## 2020-01-01 DIAGNOSIS — G309 Alzheimer's disease, unspecified: Secondary | ICD-10-CM | POA: Diagnosis not present

## 2020-01-01 LAB — POCT GLYCOSYLATED HEMOGLOBIN (HGB A1C)
Est. average glucose Bld gHb Est-mCnc: 171
Hemoglobin A1C: 7.6 % — AB (ref 4.0–5.6)

## 2020-01-01 MED ORDER — MONTELUKAST SODIUM 10 MG PO TABS: 10.0000 mg | ORAL_TABLET | Freq: Every day | ORAL | 3 refills | Status: DC

## 2020-01-01 NOTE — Patient Instructions (Signed)
.   Please review the attached list of medications and notify my office if there are any errors.   . Please bring all of your medications to every appointment so we can make sure that our medication list is the same as yours.   

## 2020-01-01 NOTE — Progress Notes (Signed)
Patient: Briana Austin Female    DOB: Jun 05, 1934   86 y.o.   MRN: [id number] Visit Date: 01/01/2020  Today's Provider: Lelon Huh, MD   Chief Complaint  Patient presents with  . Diabetes  . Hypertension   Subjective:     HPI  Diabetes Mellitus Type II, Follow-up:  Lab Results  Component Value Date   HGBA1C 7.5 (A) 08/29/2019    Last seen for diabetes 4 months ago.  Management since then includes no change.  She reports good compliance with treatment. She is not having side effects.  Current symptoms include none  Home blood sugar records: fasting range: 115  Episodes of hypoglycemia? no              Current insulin regiment: Is not on insulin Most Recent Eye Exam: UTD Weight trend: stable Prior visit with dietician: No Current exercise: none Current diet habits: well balanced  Pertinent Labs: Lab Results  Component Value Date   CHOL 157 08/29/2019   HDL 41 08/29/2019   LDLCALC 87 08/29/2019   TRIG 166 (H) 08/29/2019   CHOLHDL 3.8 08/29/2019       Wt Readings from Last 3 Encounters:  08/29/19 187 lb (84.8 kg)  05/08/19 184 lb (83.5 kg)  07/26/18 176 lb (79.8 kg)    ------------------------------------------------------------------------  Hypertension, follow-up:     BP Readings from Last 3 Encounters:  08/29/19 118/60  05/08/19 116/62  07/26/18 105/69    She was last seen for hypertension 4 months ago.  BP at that visit was 118/60. Management since that visit includes no changes. She reports good compliance with treatment. She is not having side effects.  She is not exercising. She is adherent to low salt diet.   Outside blood pressures are not being checked. She is experiencing none.  Patient denies chest pain, chest pressure/discomfort, claudication, dyspnea, exertional chest pressure/discomfort, fatigue, irregular heart beat, lower extremity edema, near-syncope, orthopnea, palpitations, paroxysmal nocturnal dyspnea,  syncope and tachypnea.   Cardiovascular risk factors include advanced age (older than 49 for men, 79 for women), diabetes mellitus and hypertension.  Use of agents associated with hypertension: NSAIDS.                Weight trend:     Wt Readings from Last 3 Encounters:  08/29/19 187 lb (84.8 kg)  05/08/19 184 lb (83.5 kg)  07/26/18 176 lb (79.8 kg)    Current diet: well balanced  ------------------------------------------------------------------------  Lipid/Cholesterol, Follow-up:   Last seen for this 4 months ago.  Management changes since that visit include no change. . Last Lipid Panel:    Component Value Date/Time   CHOL 157 08/29/2019 1143   CHOL 187 09/14/2014 0000   TRIG 166 (H) 08/29/2019 1143   TRIG 132 09/14/2014 0000   HDL 41 08/29/2019 1143   CHOLHDL 3.8 08/29/2019 1143   CHOLHDL 3.1 07/04/2015 1141   VLDL 12 07/04/2015 1141   LDLCALC 87 08/29/2019 1143    Risk factors for vascular disease include diabetes mellitus, hypercholesterolemia and hypertension  She reports good compliance with treatment. She is not having side effects.  Current symptoms include none  Weight trend: stable Prior visit with dietician: no Current diet: well balanced Current exercise: none  Wt Readings from Last 3 Encounters:  01/01/20 189 lb (85.7 kg)  08/29/19 187 lb (84.8 kg)  05/08/19 184 lb (83.5 kg)    -------------------------------------------------------------------  She does occasionally get some mucous in her throat  that makes her cough, she has no dyspnea, has been out of Singulair for awhile.  No Known Allergies   Current Outpatient Medications:  .  Accu-Chek Softclix Lancets lancets, USE AS DIRECTED, Disp: 100 each, Rfl: 11 .  aspirin EC 81 MG tablet, Take 81 mg by mouth daily., Disp: , Rfl:  .  Blood Glucose Monitoring Suppl (ACCU-CHEK AVIVA PLUS) w/Device KIT, AS DIRECTED, Disp: 1 kit, Rfl: 0 .  calcium carbonate (OS-CAL) 600 MG TABS tablet, Take 1  tablet by mouth daily., Disp: , Rfl:  .  glipiZIDE (GLUCOTROL XL) 5 MG 24 hr tablet, Take 1 tablet by mouth once daily with breakfast, Disp: 90 tablet, Rfl: 4 .  glucose blood (ACCU-CHEK AVIVA PLUS) test strip, Use to check blood sugar once a day for type 2 diabetes e11.9, Disp: 100 each, Rfl: 4 .  levETIRAcetam (KEPPRA) 500 MG tablet, Take 1 tablet by mouth twice daily, Disp: 180 tablet, Rfl: 4 .  lisinopril (ZESTRIL) 20 MG tablet, Take 1 tablet by mouth once daily, Disp: 90 tablet, Rfl: 4 .  metFORMIN (GLUCOPHAGE) 500 MG tablet, Take 1 tablet by mouth in the evening, Disp: 90 tablet, Rfl: 0 .  NAMZARIC 28-10 MG CP24, Take 1 capsule by mouth once daily, Disp: 90 capsule, Rfl: 3 .  ONE TOUCH LANCETS MISC, 1 Device by Does not apply route daily., Disp: 100 each, Rfl: 4 .  potassium chloride (KLOR-CON) 10 MEQ tablet, Take 1 tablet (10 mEq total) by mouth daily., Disp: 90 tablet, Rfl: 3 .  pravastatin (PRAVACHOL) 40 MG tablet, Take 1 tablet by mouth once daily, Disp: 90 tablet, Rfl: 4 .  vitamin B-12 1000 MCG tablet, Take 1 tablet (1,000 mcg total) by mouth daily., Disp: 30 tablet, Rfl: 1 .  Vitamin D, Ergocalciferol, (DRISDOL) 1.25 MG (50000 UT) CAPS capsule, Take 1 capsule by mouth once a week, Disp: 12 capsule, Rfl: 4 .  feeding supplement, ENSURE ENLIVE, (ENSURE ENLIVE) LIQD, Take 237 mLs by mouth 2 (two) times daily between meals. (Patient not taking: Reported on 01/01/2020), Disp: 237 mL, Rfl: 12 .  montelukast (SINGULAIR) 10 MG tablet, Take 1 tablet (10 mg total) by mouth daily. (Patient not taking: Reported on 01/01/2020), Disp: 30 tablet, Rfl: 1  Review of Systems  Constitutional: Negative.   Respiratory: Negative.   Cardiovascular: Negative.   Endocrine: Negative.   Musculoskeletal: Negative.   All other systems reviewed and are negative.   Social History   Tobacco Use  . Smoking status: Former Smoker    Packs/day: 0.50    Years: 8.00    Pack years: 4.00    Types: Cigarettes  .  Smokeless tobacco: Never Used  . Tobacco comment: > 50 years ago  Substance Use Topics  . Alcohol use: No    Alcohol/week: 0.0 standard drinks      Objective:   BP 118/60 (BP Location: Left Arm, Patient Position: Sitting, Cuff Size: Large)   Pulse 78   Temp (!) 97.3 F (36.3 C) (Temporal)   Resp 16   Wt 189 lb (85.7 kg)   SpO2 97% Comment: room air  BMI 29.60 kg/m  Vitals:   01/01/20 1058  BP: 118/60  Pulse: 78  Resp: 16  Temp: (!) 97.3 F (36.3 C)  TempSrc: Temporal  SpO2: 97%  Weight: 189 lb (85.7 kg)  Body mass index is 29.6 kg/m.   Physical Exam   General: Appearance:     Overweight female in no acute distress  Eyes:  PERRL, conjunctiva/corneas clear, EOM's intact       Lungs:     Clear to auscultation bilaterally, respirations unlabored  Heart:    Normal heart rate. Normal rhythm. No murmurs, rubs, or gallops.   MS:   All extremities are intact.   Neurologic:   Awake, alert, oriented x 1. No apparent focal neurological           defect.        Results for orders placed or performed in visit on 01/01/20  POCT HgB A1C  Result Value Ref Range   Hemoglobin A1C 7.6 (A) 4.0 - 5.6 %   Est. average glucose Bld gHb Est-mCnc 171        Assessment & Plan    1. Controlled diabetes mellitus with nephropathy ([place]) Well controlled.  Continue current medications.    2. Allergic rhinitis due to pollen, unspecified seasonality Discussed possibility of changing lisinopril to something else. Her husband doesn't think cough is frequent or severe enough for that. Will get back on - montelukast (SINGULAIR) 10 MG tablet; Take 1 tablet (10 mg total) by mouth daily.  Dispense: 30 tablet; Refill: 3  3. Dementia in Alzheimer's disease ([place]) Stable, Continue current medications.    Follow up in 4 months.     The entirety of the information documented in the History of Present Illness, Review of Systems and Physical Exam were personally obtained by me. Portions of this  information were initially documented by Meyer Cory, CMA and reviewed by me for thoroughness and accuracy.    Lelon Huh, MD  [facility]

## 2020-01-03 ENCOUNTER — Other Ambulatory Visit: Payer: Self-pay | Admitting: Family Medicine

## 2020-01-03 DIAGNOSIS — E1121 Type 2 diabetes mellitus with diabetic nephropathy: Secondary | ICD-10-CM

## 2020-01-17 ENCOUNTER — Other Ambulatory Visit: Payer: Self-pay

## 2020-01-17 ENCOUNTER — Other Ambulatory Visit: Payer: Self-pay | Admitting: Adult Health

## 2020-01-17 ENCOUNTER — Ambulatory Visit: Payer: Self-pay | Admitting: *Deleted

## 2020-01-17 ENCOUNTER — Ambulatory Visit (INDEPENDENT_AMBULATORY_CARE_PROVIDER_SITE_OTHER): Payer: Medicare HMO | Admitting: Adult Health

## 2020-01-17 VITALS — BP 140/80 | HR 87 | Temp 96.8°F | Resp 16 | Wt 189.8 lb

## 2020-01-17 DIAGNOSIS — R399 Unspecified symptoms and signs involving the genitourinary system: Secondary | ICD-10-CM

## 2020-01-17 DIAGNOSIS — B372 Candidiasis of skin and nail: Secondary | ICD-10-CM

## 2020-01-17 DIAGNOSIS — R238 Other skin changes: Secondary | ICD-10-CM | POA: Diagnosis not present

## 2020-01-17 DIAGNOSIS — N898 Other specified noninflammatory disorders of vagina: Secondary | ICD-10-CM | POA: Diagnosis not present

## 2020-01-17 LAB — POCT URINALYSIS DIPSTICK
Bilirubin, UA: NEGATIVE
Blood, UA: NEGATIVE
Glucose, UA: NEGATIVE
Ketones, UA: NEGATIVE
Leukocytes, UA: NEGATIVE
Nitrite, UA: NEGATIVE
Protein, UA: POSITIVE — AB
Spec Grav, UA: 1.01 (ref 1.010–1.025)
Urobilinogen, UA: 1 E.U./dL
pH, UA: 6.5 (ref 5.0–8.0)

## 2020-01-17 MED ORDER — NYSTATIN 100000 UNIT/GM EX CREA: 1.0000 "application " | TOPICAL_CREAM | Freq: Two times a day (BID) | CUTANEOUS | 0 refills | Status: DC

## 2020-01-17 NOTE — Telephone Encounter (Signed)
Husband states his wife had blood on tissue after wiping. He is not sure of source- she had flushed the toilet before he could see water. He will check next time she goes to bathroom to see if water in bowl is pink. Appointment scheduled.  Reason for Disposition . Blood in urine  (Exception: could be normal menstrual bleeding)  Answer Assessment - Initial Assessment Questions 1. SYMPTOM: "What's the main symptom you're concerned about?" (e.g., frequency, incontinence)     Blood when wiped this morning 2. ONSET: "When did the  bleeding  start?"     This morning 3. PAIN: "Is there any pain?" If so, ask: "How bad is it?" (Scale: 1-10; mild, moderate, severe)     No pain noted 4. CAUSE: "What do you think is causing the symptoms?"     Not sure if vaginal(patient has has hysterectomy) or urinary as source 5. OTHER SYMPTOMS: "Do you have any other symptoms?" (e.g., fever, flank pain, blood in urine, pain with urination)     no 6. PREGNANCY: "Is there any chance you are pregnant?" "When was your last menstrual period?"     n/a  Protocols used: URINE - BLOOD IN-A-AH, URINARY [facility]

## 2020-01-17 NOTE — Progress Notes (Signed)
Patient: Briana Austin Female    DOB: May 13, 1934   86 y.o.   MRN: [id number] Visit Date: 01/17/2020  Today's Provider: Marcille Buffy, FNP   Chief Complaint  Patient presents with  . Hematuria   Subjective:    Patient is a 86 year old female in no acute distress who comes to the clinic accompanied by  her husband who reports she had a small drop pf blood in her pull up this morning towards the front side of the depends.  Denies any bleeding since. Had small amount pink blood on tissue when wiped this morning x 1 episode as they notices it in depends as above.  Denies any blood in stool or tarry color stools per husband. Denies any rectal pain. Last colonoscopy was 2006 - stopped due to age.  Denies any abdominal pain.  Vaginal itching.  Denies any injury or trauma.   Patient  denies any fever, body aches,chills, chest pain, shortness of breath, nausea, vomiting, or diarrhea.   No Known Allergies   Current Outpatient Medications:  .  Accu-Chek Softclix Lancets lancets, USE AS DIRECTED, Disp: 100 each, Rfl: 11 .  aspirin EC 81 MG tablet, Take 81 mg by mouth daily., Disp: , Rfl:  .  Blood Glucose Monitoring Suppl (ACCU-CHEK AVIVA PLUS) w/Device KIT, AS DIRECTED, Disp: 1 kit, Rfl: 0 .  calcium carbonate (OS-CAL) 600 MG TABS tablet, Take 1 tablet by mouth daily., Disp: , Rfl:  .  glipiZIDE (GLUCOTROL XL) 5 MG 24 hr tablet, Take 1 tablet by mouth once daily with breakfast, Disp: 90 tablet, Rfl: 4 .  glucose blood (ACCU-CHEK AVIVA PLUS) test strip, Use to check blood sugar once a day for type 2 diabetes e11.9, Disp: 100 each, Rfl: 4 .  levETIRAcetam (KEPPRA) 500 MG tablet, Take 1 tablet by mouth twice daily, Disp: 180 tablet, Rfl: 4 .  lisinopril (ZESTRIL) 20 MG tablet, Take 1 tablet by mouth once daily, Disp: 90 tablet, Rfl: 4 .  metFORMIN (GLUCOPHAGE) 500 MG tablet, Take 1 tablet by mouth in the evening, Disp: 90 tablet, Rfl: 1 .  montelukast (SINGULAIR) 10 MG tablet,  Take 1 tablet (10 mg total) by mouth daily., Disp: 30 tablet, Rfl: 3 .  NAMZARIC 28-10 MG CP24, Take 1 capsule by mouth once daily, Disp: 90 capsule, Rfl: 3 .  ONE TOUCH LANCETS MISC, 1 Device by Does not apply route daily., Disp: 100 each, Rfl: 4 .  potassium chloride (KLOR-CON) 10 MEQ tablet, Take 1 tablet (10 mEq total) by mouth daily., Disp: 90 tablet, Rfl: 3 .  pravastatin (PRAVACHOL) 40 MG tablet, Take 1 tablet by mouth once daily, Disp: 90 tablet, Rfl: 4 .  vitamin B-12 1000 MCG tablet, Take 1 tablet (1,000 mcg total) by mouth daily., Disp: 30 tablet, Rfl: 1 .  Vitamin D, Ergocalciferol, (DRISDOL) 1.25 MG (50000 UT) CAPS capsule, Take 1 capsule by mouth once a week, Disp: 12 capsule, Rfl: 4 .  feeding supplement, ENSURE ENLIVE, (ENSURE ENLIVE) LIQD, Take 237 mLs by mouth 2 (two) times daily between meals. (Patient not taking: Reported on 01/01/2020), Disp: 237 mL, Rfl: 12  Review of Systems  Constitutional: Negative for chills and fever.  Gastrointestinal: Negative for abdominal pain, nausea and vomiting.  Genitourinary: Positive for urgency (husband reports urgency is always in the mornings upon wakening for  many years. no change. ). Negative for dysuria, flank pain, frequency, hematuria, hesitancy and nocturia.  One small spot of blood in depends this morning.     Social History   Tobacco Use  . Smoking status: Former Smoker    Packs/day: 0.50    Years: 8.00    Pack years: 4.00    Types: Cigarettes  . Smokeless tobacco: Never Used  . Tobacco comment: > 50 years ago  Substance Use Topics  . Alcohol use: No    Alcohol/week: 0.0 standard drinks      Objective:   BP 140/80   Pulse 87   Temp (!) 96.8 F (36 C) (Oral)   Resp 16   Wt 189 lb 12.8 oz (86.1 kg)   SpO2 97%   BMI 29.73 kg/m  Vitals:   01/17/20 1411  BP: 140/80  Pulse: 87  Resp: 16  Temp: (!) 96.8 F (36 C)  TempSrc: Oral  SpO2: 97%  Weight: 189 lb 12.8 oz (86.1 kg)  Body mass index is 29.73  kg/m. Temporal temperature not oral was taken as above.   Physical Exam Abdominal:     Tenderness: There is no abdominal tenderness. There is no right CVA tenderness, left CVA tenderness, guarding or rebound. Negative signs include Murphy's sign, Rovsing's sign, McBurney's sign, psoas sign and obturator sign.     Comments: Normal abdominal exam no tenderness noted with light and deep palpation. No facial grimace noted. No guarding.   Genitourinary:    General: Normal vulva.     Exam position: Lithotomy position.     Labia:        Right: Rash and injury (appears like scratch mark, healing. ) present.        Left: Rash present.      Rectum: Normal. No mass, tenderness, external hemorrhoid or internal hemorrhoid.       Comments: Small skin tear to right upper labia majora, small amount of crusted dried blood.   Erythematous rash macular moist in groin fold and labia majora.   No internal exam vaginal.   No stool for hemoccult. No rectal bleeding noted.      No results found for any visits on 01/17/20.     Assessment & Plan      Yeast infection of the skin - Plan: CBC with Differential/Platelet, Urine Culture, nystatin cream (MYCOSTATIN), Comprehensive Metabolic Panel (CMET), CANCELED: BUN  Vaginal irritation - Plan: POCT urinalysis dipstick, CBC with Differential/Platelet, Urine Culture, nystatin cream (MYCOSTATIN), CANCELED: BUN  Skin irritation - Plan: nystatin cream (MYCOSTATIN)  Meds ordered this encounter  Medications  . nystatin cream (MYCOSTATIN)    Sig: Apply 1 application topically 2 (two) times daily. Apply thin layer to outer vaginal skin and groin creases where irritated. Not for internal.    Dispense:  30 g    Refill:  0  Change briefs often stay clean and dry. Pat skin dry do not rub area.  Labs today before leaving CBC and CMP.  Return to office if any symptoms change or worsen.  Nystatin cream as needed.   Return in about 2 weeks (around 01/31/2020), or if  symptoms worsen or fail to improve, for at any time for any worsening symptoms, Go to Emergency room/ urgent care if worse. Advised patient call the office or your primary care doctor for an appointment if no improvement within 72 hours or if any symptoms change or worsen at any time  Advised ER or urgent Care if after hours or on weekend. Call 911 for emergency symptoms at any time.Patinet verbalized understanding of all instructions  given/reviewed and treatment plan and has no further questions or concerns at this time.        The entirety of the information documented in the History of Present Illness, Review of Systems and Physical Exam were personally obtained by me. Portions of this information were initially documented by the  Certified Medical Assistant whose name is documented in [place] and reviewed by me for thoroughness and accuracy.  I have personally performed the exam and reviewed the chart and it is accurate to the best of my knowledge.  Haematologist has been used and any errors in dictation or transcription are unintentional.  Kelby Aline. [place], [facility]

## 2020-01-17 NOTE — Patient Instructions (Signed)
Change briefs often stay clean and dry. Pat skin dry do not rub area.  Labs today before leaving CBC and CMP.  Return to office if any symptoms change or worsen.  Nystatin cream as needed.   Advised patient call the office or your primary care doctor for an appointment if no improvement within 72 hours or if any symptoms change or worsen at any time  Advised ER or urgent Care if after hours or on weekend. Call 911 for emergency symptoms at any time.Patinet verbalized understanding of all instructions given/reviewed and treatment plan and has no further questions or concerns at this time.      Skin Yeast Infection  A skin yeast infection is a condition in which there is an overgrowth of yeast (candida) that normally lives on the skin. This condition usually occurs in areas of the skin that are constantly warm and moist, such as the armpits or the groin. What are the causes? This condition is caused by a change in the normal balance of the yeast and bacteria that live on the skin. What increases the risk? You are more likely to develop this condition if you:  Are obese.  Are pregnant.  Take birth control pills.  Have diabetes.  Take antibiotic medicines.  Take steroid medicines.  Are malnourished.  Have a weak body defense system (immune system).  Are 84 years of age or older.  Wear tight clothing. What are the signs or symptoms? The most common symptom of this condition is itchiness in the affected area. Other symptoms include:  Red, swollen area of the skin.  Bumps on the skin. How is this diagnosed?  This condition is diagnosed with a medical history and physical exam.  Your health care provider may check for yeast by taking light scrapings of the skin to be viewed under a microscope. How is this treated? This condition is treated with medicine. Medicines may be prescribed or be available over the counter. The medicines may be:  Taken by mouth (orally).  Applied as  a cream or powder to your skin. Follow these instructions at home:   Take or apply over-the-counter and prescription medicines only as told by your health care provider.  Maintain a healthy weight. If you need help losing weight, talk with your health care provider.  Keep your skin clean and dry.  If you have diabetes, keep your blood sugar under control.  Keep all follow-up visits as told by your health care provider. This is important. Contact a health care provider if:  Your symptoms go away and then return.  Your symptoms do not get better with treatment.  Your symptoms get worse.  Your rash spreads.  You have a fever or chills.  You have new symptoms.  You have new warmth or redness of your skin. Summary  A skin yeast infection is a condition in which there is an overgrowth of yeast (candida) that normally lives on the skin. This condition is caused by a change in the normal balance of the yeast and bacteria that live on the skin.  Take or apply over-the-counter and prescription medicines only as told by your health care provider.  Keep your skin clean and dry.  Contact a health care provider if your symptoms do not get better with treatment. This information is not intended to replace advice given to you by your health care provider. Make sure you discuss any questions you have with your health care provider. Document Revised: 03/08/2018 Document Reviewed:  03/08/2018 Elsevier Patient Education  [place]. Nystatin skin cream or ointment What is this medicine? NYSTATIN (nye STAT in) is an antifungal medicine. It is used to treat certain kinds of fungal or yeast infections of the skin. This medicine may be used for other purposes; ask your health care provider or pharmacist if you have questions. COMMON BRAND NAME(S): Mycostatin, Nystex, Pediaderm AF What should I tell my health care provider before I take this medicine? They need to know if you have any of  these conditions:  an unusual or allergic reaction to nystatin, other foods, dyes or preservatives  pregnant or trying to get pregnant  breast-feeding How should I use this medicine? This medicine is for external use on the skin only. Follow the directions on the prescription label. Wash hands before and after use. If treating a hand or nail infection, wash hands before use only. Apply a thin layer of this medicine to cover the affected skin and surrounding area. You can cover the area with a sterile gauze dressing (bandage). Do not use an airtight bandage (such as a plastic-covered bandage). Do not get the medicine in your eyes. If you do, rinse out with plenty of cool tap water. Use the full course of treatment prescribed, even if you think the infection is getting better. Use at regular intervals. Do not use your medicine more often than directed. Do not use this medicine for any condition other than the one for which it was prescribed. Talk to your pediatrician regarding the use of this medicine in children. Special care may be needed. Overdosage: If you think you have taken too much of this medicine contact a poison control center or emergency room at once. NOTE: This medicine is only for you. Do not share this medicine with others. What if I miss a dose? If you miss a dose, use it as soon as you can. If it is almost time for your next dose, use only that dose. Do not use double or extra doses. What may interact with this medicine? Interactions are not expected. Do not use any other skin products on the affected area without telling your doctor or health care professional. This list may not describe all possible interactions. Give your health care provider a list of all the medicines, herbs, non-prescription drugs, or dietary supplements you use. Also tell them if you smoke, drink alcohol, or use illegal drugs. Some items may interact with your medicine. What should I watch for while using this  medicine? Tell your doctor or health care professional if your symptoms do not improve after 3 days. After bathing make sure that your skin is very dry. Fungal infections like moist conditions. Do not walk around barefoot. To help prevent reinfection, wear freshly washed cotton, not synthetic, clothing. What side effects may I notice from receiving this medicine? Side effects that usually do not require medical attention (report to your doctor or health care professional if they continue or are bothersome):  skin irritation This list may not describe all possible side effects. Call your doctor for medical advice about side effects. You may report side effects to FDA at 1-800-FDA-1088. Where should I keep my medicine? Keep out of the reach of children. Store at room temperature 15 to 30 degrees C (59 to 86 degrees F). Throw away any unused medicine after the expiration date. NOTE: This sheet is a summary. It may not cover all possible information. If you have questions about this medicine, talk to  your doctor, pharmacist, or health care provider.  2020 Elsevier/Gold Standard (2015-11-20 16:28:30)

## 2020-01-18 LAB — COMPREHENSIVE METABOLIC PANEL
ALT: 18 IU/L (ref 0–32)
AST: 15 IU/L (ref 0–40)
Albumin/Globulin Ratio: 1.1 — ABNORMAL LOW (ref 1.2–2.2)
Albumin: 4.1 g/dL (ref 3.6–4.6)
Alkaline Phosphatase: 76 IU/L (ref 39–117)
BUN/Creatinine Ratio: 11 — ABNORMAL LOW (ref 12–28)
BUN: 15 mg/dL (ref 8–27)
Bilirubin Total: <0.2 mg/dL (ref 0.0–1.2)
CO2: 23 mmol/L (ref 20–29)
Calcium: 10.8 mg/dL — ABNORMAL HIGH (ref 8.7–10.3)
Chloride: 101 mmol/L (ref 96–106)
Creatinine, Ser: 1.4 mg/dL — ABNORMAL HIGH (ref 0.57–1.00)
GFR calc Af Amer: 40 mL/min/{1.73_m2} — ABNORMAL LOW (ref >59)
GFR calc non Af Amer: 35 mL/min/{1.73_m2} — ABNORMAL LOW (ref >59)
Globulin, Total: 3.8 g/dL (ref 1.5–4.5)
Glucose: 180 mg/dL — ABNORMAL HIGH (ref 65–99)
Potassium: 4.6 mmol/L (ref 3.5–5.2)
Sodium: 138 mmol/L (ref 134–144)
Total Protein: 7.9 g/dL (ref 6.0–8.5)

## 2020-01-18 LAB — CBC WITH DIFFERENTIAL/PLATELET
Basophils Absolute: 0.1 10*3/uL (ref 0.0–0.2)
Basos: 1 %
EOS (ABSOLUTE): 0.2 10*3/uL (ref 0.0–0.4)
Eos: 2 %
Hematocrit: 32 % — ABNORMAL LOW (ref 34.0–46.6)
Hemoglobin: 10.5 g/dL — ABNORMAL LOW (ref 11.1–15.9)
Immature Grans (Abs): 0 10*3/uL (ref 0.0–0.1)
Immature Granulocytes: 1 %
Lymphocytes Absolute: 2.9 10*3/uL (ref 0.7–3.1)
Lymphs: 34 %
MCH: 30.9 pg (ref 26.6–33.0)
MCHC: 32.8 g/dL (ref 31.5–35.7)
MCV: 94 fL (ref 79–97)
Monocytes Absolute: 0.9 10*3/uL (ref 0.1–0.9)
Monocytes: 11 %
Neutrophils Absolute: 4.4 10*3/uL (ref 1.4–7.0)
Neutrophils: 51 %
Platelets: 255 10*3/uL (ref 150–450)
RBC: 3.4 x10E6/uL — ABNORMAL LOW (ref 3.77–5.28)
RDW: 13.3 % (ref 11.7–15.4)
WBC: 8.4 10*3/uL (ref 3.4–10.8)

## 2020-01-19 LAB — URINE CULTURE

## 2020-01-19 NOTE — Progress Notes (Signed)
Urine shows no significant growth of bacteria.  Labs were not fasting. Mild improvement in hemoglobin from 4 months ago. Non fasting glucose was elevated at 180. Kidney function scant decrease since 4 months ago, maintain hydration.  Calcium remains slightly elevated. Keep plan as discussed in office and return if symptoms do not resolve or return. Keep regular health maintenance appointments with Dr. Caryn Section.

## 2020-01-22 ENCOUNTER — Encounter: Payer: Self-pay | Admitting: Adult Health

## 2020-01-22 DIAGNOSIS — B372 Candidiasis of skin and nail: Secondary | ICD-10-CM | POA: Insufficient documentation

## 2020-01-22 DIAGNOSIS — N898 Other specified noninflammatory disorders of vagina: Secondary | ICD-10-CM | POA: Insufficient documentation

## 2020-01-22 DIAGNOSIS — R238 Other skin changes: Secondary | ICD-10-CM | POA: Insufficient documentation

## 2020-01-26 ENCOUNTER — Other Ambulatory Visit: Payer: Self-pay | Admitting: Family Medicine

## 2020-01-26 MED ORDER — GLUCOSE BLOOD VI STRP: ORAL_STRIP | 4 refills | Status: DC

## 2020-02-12 ENCOUNTER — Other Ambulatory Visit: Payer: Self-pay | Admitting: Family Medicine

## 2020-02-12 DIAGNOSIS — Z1231 Encounter for screening mammogram for malignant neoplasm of breast: Secondary | ICD-10-CM

## 2020-02-15 DIAGNOSIS — R399 Unspecified symptoms and signs involving the genitourinary system: Secondary | ICD-10-CM | POA: Insufficient documentation

## 2020-02-16 ENCOUNTER — Ambulatory Visit
Admission: RE | Admit: 2020-02-16 | Discharge: 2020-02-16 | Disposition: A | Discharge status: Home / Self Care | Payer: Medicare HMO | Source: Ambulatory Visit | Attending: Family Medicine | Admitting: Family Medicine

## 2020-02-16 DIAGNOSIS — Z1231 Encounter for screening mammogram for malignant neoplasm of breast: Secondary | ICD-10-CM | POA: Insufficient documentation

## 2020-03-13 ENCOUNTER — Ambulatory Visit (INDEPENDENT_AMBULATORY_CARE_PROVIDER_SITE_OTHER): Payer: Medicare HMO | Admitting: Physician Assistant

## 2020-03-13 ENCOUNTER — Encounter: Payer: Self-pay | Admitting: Physician Assistant

## 2020-03-13 ENCOUNTER — Other Ambulatory Visit: Payer: Self-pay

## 2020-03-13 VITALS — BP 154/90 | HR 90 | Temp 95.9°F | Wt 179.4 lb

## 2020-03-13 DIAGNOSIS — K649 Unspecified hemorrhoids: Secondary | ICD-10-CM

## 2020-03-13 MED ORDER — HYDROCORTISONE (PERIANAL) 2.5 % EX CREA: 1.0000 "application " | TOPICAL_CREAM | Freq: Two times a day (BID) | CUTANEOUS | 0 refills | Status: DC

## 2020-03-13 NOTE — Progress Notes (Signed)
Established patient visit   Patient: Briana Austin   DOB: [birth year]   86 y.o. Female  MRN: [id number] Visit Date: 03/13/2020  Today's healthcare provider: Trinna Post, PA-C   Chief Complaint  Patient presents with  . Constipation  I,Porsha C McClurkin,acting as a scribe for Trinna Post, PA-C.,have documented all relevant documentation on the behalf of Trinna Post, PA-C,as directed by  Trinna Post, PA-C while in the presence of Trinna Post, PA-C.  Subjective    Constipation This is a new problem. The current episode started 1 to 4 weeks ago. The problem has been gradually worsening since onset. Her stool frequency is 4 to 5 times per week. The stool is described as firm. The patient is not on a high fiber diet. She does not exercise regularly. There has not been adequate water intake (3-4 8 ounces of water). Pertinent negatives include no back pain, diarrhea, fecal incontinence or hemorrhoids. She has tried stool softeners for the symptoms. The treatment provided no relief.  Patients daughter states that they hear mother in bathroom screaming while using the bathroom. Daughter reports patient will try to remove the stool with hands. Daughter reports a foul odor as well. Patient eats a bananas and apple daily and jr bacon cheeseburger everyday for lunch. No blood in stool.      Medications: Outpatient Medications Prior to Visit  Medication Sig  . Accu-Chek Softclix Lancets lancets USE AS DIRECTED  . aspirin EC 81 MG tablet Take 81 mg by mouth daily.  . Blood Glucose Monitoring Suppl (ACCU-CHEK AVIVA PLUS) w/Device KIT AS DIRECTED  . calcium carbonate (OS-CAL) 600 MG TABS tablet Take 1 tablet by mouth daily.  . feeding supplement, ENSURE ENLIVE, (ENSURE ENLIVE) LIQD Take 237 mLs by mouth 2 (two) times daily between meals.  Marland Kitchen glipiZIDE (GLUCOTROL XL) 5 MG 24 hr tablet Take 1 tablet by mouth once daily with breakfast  . glucose blood test strip USE TO CHECK  GLUCOSE ONCE DAILY FOR TYPE 2 DIABETES E11.9  . levETIRAcetam (KEPPRA) 500 MG tablet Take 1 tablet by mouth twice daily  . lisinopril (ZESTRIL) 20 MG tablet Take 1 tablet by mouth once daily  . metFORMIN (GLUCOPHAGE) 500 MG tablet Take 1 tablet by mouth in the evening  . montelukast (SINGULAIR) 10 MG tablet Take 1 tablet (10 mg total) by mouth daily.  Marland Kitchen NAMZARIC 28-10 MG CP24 Take 1 capsule by mouth once daily  . nystatin cream (MYCOSTATIN) Apply 1 application topically 2 (two) times daily. Apply thin layer to outer vaginal skin and groin creases where irritated. Not for internal.  . ONE TOUCH LANCETS MISC 1 Device by Does not apply route daily.  . potassium chloride (KLOR-CON) 10 MEQ tablet Take 1 tablet (10 mEq total) by mouth daily.  . pravastatin (PRAVACHOL) 40 MG tablet Take 1 tablet by mouth once daily  . vitamin B-12 1000 MCG tablet Take 1 tablet (1,000 mcg total) by mouth daily.  . Vitamin D, Ergocalciferol, (DRISDOL) 1.25 MG (50000 UT) CAPS capsule Take 1 capsule by mouth once a week   No facility-administered medications prior to visit.    Review of Systems  Constitutional: Negative.   Cardiovascular: Negative.   Gastrointestinal: Positive for constipation. Negative for diarrhea and hemorrhoids.  Musculoskeletal: Negative for back pain.  Hematological: Negative.       Objective    BP (!) 154/90 (BP Location: Left Arm, Patient Position: Sitting, Cuff Size: Normal)   Pulse  90   Temp (!) 95.9 F (35.5 C) (Temporal)   Wt 179 lb 6.4 oz (81.4 kg)   SpO2 97%   BMI 28.10 kg/m    Physical Exam Exam conducted with a chaperone present.  Constitutional:      Appearance: Normal appearance.  Genitourinary:    Rectum: External hemorrhoid present.  Skin:    General: Skin is warm and dry.  Neurological:     General: No focal deficit present.     Mental Status: She is alert and oriented to person, place, and time.  Psychiatric:        Mood and Affect: Mood normal.         Behavior: Behavior normal.       No results found for any visits on 03/13/20.  Assessment & Plan    1. Hemorrhoids, unspecified hemorrhoid type Patient has external hemorrhoid of her rectum. Patient was prescribed rectal cream for the hemorrhoid. May need GI follow up if not improving.   - hydrocortisone (ANUSOL-HC) 2.5 % rectal cream; Place 1 application rectally 2 (two) times daily.  Dispense: 30 g; Refill: 0   Return if symptoms worsen or fail to improve.      ITrinna Post, PA-C, have reviewed all documentation for this visit. The documentation on 03/13/20 for the exam, diagnosis, procedures, and orders are all accurate and complete.    Paulene Floor  [facility] [phone] (phone) [phone] (fax)  Real

## 2020-03-13 NOTE — Patient Instructions (Signed)

## 2020-04-02 ENCOUNTER — Other Ambulatory Visit: Payer: Self-pay | Admitting: Family Medicine

## 2020-04-02 DIAGNOSIS — F0280 Dementia in other diseases classified elsewhere without behavioral disturbance: Secondary | ICD-10-CM

## 2020-04-05 ENCOUNTER — Telehealth: Payer: Self-pay | Admitting: Family Medicine

## 2020-04-05 NOTE — Chronic Care Management (AMB) (Signed)
  Chronic Care Management   Note  04/05/2020 Name: [name] MRN: [id number] DOB: 03/16/1934  Briana Austin is a 86 y.o. year old female who is a primary care patient of Caryn Section, Kirstie Peri, MD. I reached out to [name] by phone today in response to a referral sent by [name] PCP, Dr. Lelon Huh     [name] was given information about Chronic Care Management services today including:  1. CCM service includes personalized support from designated clinical staff supervised by her physician, including individualized plan of care and coordination with other care providers 2. 24/7 contact phone numbers for assistance for urgent and routine care needs. 3. Service will only be billed when office clinical staff spend 20 minutes or more in a month to coordinate care. 4. Only one practitioner may furnish and bill the service in a calendar month. 5. The patient may stop CCM services at any time (effective at the end of the month) by phone call to the office staff. 6. The patient will be responsible for cost sharing (co-pay) of up to 20% of the service fee (after annual deductible is met).  Patient's daughter agreed to services and verbal consent obtained.   Follow up plan: Telephone appointment with care management team member scheduled for:04/23/2020  Glenna Durand, LPN Health Advisor, [place] Management ??[name] ??[phone]

## 2020-04-05 NOTE — Chronic Care Management (AMB) (Signed)
°  Chronic Care Management   Outreach Note  04/05/2020 Name: Briana Austin MRN: [id number] DOB: 03/22/1934  Briana Austin is a 86 y.o. year old female who is a primary care patient of Caryn Section, Kirstie Peri, MD. I reached out to [name] by phone today in response to a referral sent by [name]'s PCP, Dr. Lelon Huh     An unsuccessful telephone outreach was attempted today. The patient was referred to the case management team for assistance with care management and care coordination.   Follow Up Plan: A HIPPA compliant phone message was left for the patient providing contact information and requesting a return call.  The care management team will reach out to the patient again over the next 7 days.  If patient returns call to provider office, please advise to call Embedded Care Management Care Guide Glenna Durand LPN at [phone]  Isidro Monks, LPN Health Advisor, [facility] ??[email or web address]  ??[phone]

## 2020-04-17 ENCOUNTER — Telehealth: Payer: Self-pay

## 2020-04-17 DIAGNOSIS — B351 Tinea unguium: Secondary | ICD-10-CM

## 2020-04-17 DIAGNOSIS — E1121 Type 2 diabetes mellitus with diabetic nephropathy: Secondary | ICD-10-CM

## 2020-04-17 NOTE — Telephone Encounter (Signed)
Copied from [place] [phone] [phone]. Topic: Referral - Request for Referral >> Apr 17, 2020  1:21 PM Lennox Solders wrote: Has patient seen PCP for this complaint? No . Pt daughter [name] is calling and she would like a referral for her mom to see podiatrist due to   dm and needs her toenail cut. Pt has [employer]

## 2020-04-19 ENCOUNTER — Telehealth: Payer: Self-pay | Admitting: *Deleted

## 2020-04-19 NOTE — Telephone Encounter (Signed)
Copied from [place] [id number]. Topic: Appointment Scheduling - Scheduling Inquiry for Clinic >> Apr 19, 2020 11:58 AM Lennox Solders wrote: Reason for CRM: pt daughter would like her mother to be seen today . Pt is sluggish/fatigue  for 4 to 5 days may have UTI per daughter pt has had uti in past. Pt daughter did not give any symptoms. Pt does have dementia. If md can not work her in today next week is ok. Pt daughter does not know if sluggish/fatigue is a part of dementia

## 2020-04-19 NOTE — Telephone Encounter (Signed)
Patient's daughter offered a appointment for patient today at 3:20 with [facility]. Daughter declined. She wanted to wait until June 25 th for a appointment. Explained to daughter if patient has a UTI it is not good top wait another week. Daughter then said she did not think that was what the patient had. She thinks the dementia is progressing.

## 2020-04-23 ENCOUNTER — Ambulatory Visit: Payer: Medicare HMO

## 2020-04-23 NOTE — Chronic Care Management (AMB) (Signed)
  Chronic Care Management   Outreach Note  04/23/2020 Name: CAMREE WIGINGTON MRN: [id number] DOB: 03/18/1934  Primary Care Provider Birdie Sons, MD Reason for referral : Chronic Care Management   Mrs. Najarian was referred to the case management team for assistance with care management and care coordination. Briefly spoke with her spouse today. Requested follow-up with outreach with their daughter to discuss specific care management needs. Contact information provided. Mr. Escalera will relay message to his daughter to call when she is available.   Follow Up Plan: Anticipate outreach with Mrs. Nave's daughter within the next two weeks.   [facility] Care Management [phone]

## 2020-04-24 ENCOUNTER — Ambulatory Visit: Payer: Self-pay

## 2020-04-24 NOTE — Progress Notes (Signed)
I,Roshena L Chambers,acting as a scribe for Lelon Huh, MD.,have documented all relevant documentation on the behalf of Lelon Huh, MD,as directed by  Lelon Huh, MD while in the presence of Lelon Huh, MD.  Established patient visit   Patient: Briana Austin   DOB: January 21, 1934   86 y.o. Female  MRN: [id number] Visit Date: 04/26/2020  Today's healthcare provider: Lelon Huh, MD   Chief Complaint  Patient presents with  . Fatigue   Subjective    HPI Fatigue: Patients daughter comes in reporting that patient has been extremely tired within the past 1-2 weeks. Patient sleeps all day and only gets up to eat. She sleeps solidly through the night, but snores loudly. Family has not noticed any abnormal breathing patters.   Constipation: Patients daughter reports that patent is having issues having a bowel movement. She often has to strain to have a bowel movement. She has tried taking Metamucil and prescription Anusol suppositories with no relief. Patient only drinks 12-18 oz of water daily. Her home blood sugars have averaged 117.   Nail problem: Patients family request a referral to podiatry so that patient can have her toenail trimmed.    Diabetes Mellitus Type II, Follow-up  Lab Results  Component Value Date   HGBA1C 7.6 (A) 01/01/2020   HGBA1C 7.5 (A) 08/29/2019   HGBA1C 7.7 (A) 05/08/2019   Wt Readings from Last 3 Encounters:  04/26/20 183 lb (83 kg)  03/13/20 179 lb 6.4 oz (81.4 kg)  01/17/20 189 lb 12.8 oz (86.1 kg)   Last seen for diabetes 3 months ago.  Management since then includes continuing same medications. She reports good compliance with treatment. She is not having side effects.  Symptoms: Yes fatigue No foot ulcerations  No appetite changes No nausea  No paresthesia of the feet  No polydipsia  No polyuria No visual disturbances   No vomiting     Home blood sugar records: fasting range: 117  Episodes of hypoglycemia? No    Current  insulin regiment: none Most Recent Eye Exam: 09/2019 Current exercise: none Current diet habits: on average, 2 meals per day  Pertinent Labs: Lab Results  Component Value Date   CHOL 157 08/29/2019   HDL 41 08/29/2019   LDLCALC 87 08/29/2019   TRIG 166 (H) 08/29/2019   CHOLHDL 3.8 08/29/2019   Lab Results  Component Value Date   NA 138 01/17/2020   K 4.6 01/17/2020   CREATININE 1.40 (H) 01/17/2020   GFRNONAA 35 (L) 01/17/2020   GFRAA 40 (L) 01/17/2020   GLUCOSE 180 (H) 01/17/2020     ---------------------------------------------------------------------------------------------------  Follow up for Dementia:  The patient was last seen for this 3 months ago. Changes made at last visit include none; continue current medications.  She reports good compliance with treatment. She feels that condition is Unchanged. She is not having side effects.   -----------------------------------------------------------------------------------------       Medications: Outpatient Medications Prior to Visit  Medication Sig  . Accu-Chek Softclix Lancets lancets USE AS DIRECTED  . aspirin EC 81 MG tablet Take 81 mg by mouth daily.  . Blood Glucose Monitoring Suppl (ACCU-CHEK AVIVA PLUS) w/Device KIT AS DIRECTED  . calcium carbonate (OS-CAL) 600 MG TABS tablet Take 1 tablet by mouth daily.  . feeding supplement, ENSURE ENLIVE, (ENSURE ENLIVE) LIQD Take 237 mLs by mouth 2 (two) times daily between meals.  Marland Kitchen glipiZIDE (GLUCOTROL XL) 5 MG 24 hr tablet Take 1 tablet by mouth once daily with  breakfast  . glucose blood test strip USE TO CHECK GLUCOSE ONCE DAILY FOR TYPE 2 DIABETES E11.9  . hydrocortisone (ANUSOL-HC) 2.5 % rectal cream Place 1 application rectally 2 (two) times daily. (Patient not taking: Reported on 04/26/2020)  . levETIRAcetam (KEPPRA) 500 MG tablet Take 1 tablet by mouth twice daily  . lisinopril (ZESTRIL) 20 MG tablet Take 1 tablet by mouth once daily  . metFORMIN (GLUCOPHAGE)  500 MG tablet Take 1 tablet by mouth in the evening  . montelukast (SINGULAIR) 10 MG tablet Take 1 tablet (10 mg total) by mouth daily.  Marland Kitchen NAMZARIC 28-10 MG CP24 Take 1 capsule by mouth once daily  . nystatin cream (MYCOSTATIN) Apply 1 application topically 2 (two) times daily. Apply thin layer to outer vaginal skin and groin creases where irritated. Not for internal. (Patient not taking: Reported on 04/26/2020)  . ONE TOUCH LANCETS MISC 1 Device by Does not apply route daily.  . potassium chloride (KLOR-CON) 10 MEQ tablet Take 1 tablet (10 mEq total) by mouth daily.  . pravastatin (PRAVACHOL) 40 MG tablet Take 1 tablet by mouth once daily  . vitamin B-12 1000 MCG tablet Take 1 tablet (1,000 mcg total) by mouth daily.  . Vitamin D, Ergocalciferol, (DRISDOL) 1.25 MG (50000 UT) CAPS capsule Take 1 capsule by mouth once a week   No facility-administered medications prior to visit.    Review of Systems  Constitutional: Positive for fatigue. Negative for appetite change, chills and fever.  Respiratory: Negative for chest tightness and shortness of breath.   Cardiovascular: Negative for chest pain and palpitations.  Gastrointestinal: Positive for constipation. Negative for abdominal pain, nausea and vomiting.  Neurological: Negative for dizziness and weakness.      Objective    BP 118/64 (BP Location: Left Arm, Patient Position: Sitting, Cuff Size: Large)   Pulse 85   Temp (!) 97.3 F (36.3 C) (Temporal)   Resp 18   Wt 183 lb (83 kg)   SpO2 95% Comment: room air  BMI 28.66 kg/m    Physical Exam    General: Appearance:     Overweight female in no acute distress  Eyes:    PERRL, conjunctiva/corneas clear, EOM's intact       Lungs:     Clear to auscultation bilaterally, respirations unlabored  Heart:    Normal heart rate. Normal rhythm. No murmurs, rubs, or gallops.   MS:   All extremities are intact.   Neurologic:   Awake, alert, oriented x 1.        No results found for any  visits on 04/26/20.  Assessment & Plan     1. Other fatigue  - CBC - Comprehensive metabolic panel - T4, free - TSH  2. Excessive daytime sleepiness Sleep study to rule out apnea  3. Essential hypertension Stable. Continue current medications.    4. Physical deconditioning   5. Controlled diabetes mellitus with nephropathy ([place]) Doing well current medications.  - Hemoglobin A1c - Ambulatory referral to Podiatry  6. Normocytic anemia -CBC  7. Vitamin D deficiency  - VITAMIN D 25 Hydroxy (Vit-D Deficiency, Fractures)  8. Other hyperlipidemia Well controlled.  - Lipid panel  9. Onychomycosis  - Ambulatory referral to Podiatry  10. Seizures ([place]) Completely controlled on Keppra, see   11. Constipation  Is using Miralax. Will see how thyroid functions. Consider additional medications.   No follow-ups on file.      The entirety of the information documented in the History of Present  Illness, Review of Systems and Physical Exam were personally obtained by me. Portions of this information were initially documented by the CMA and reviewed by me for thoroughness and accuracy.      Lelon Huh, MD  [facility] [phone] (phone) [phone] (fax)  [place]

## 2020-04-26 ENCOUNTER — Other Ambulatory Visit: Payer: Self-pay

## 2020-04-26 ENCOUNTER — Encounter: Payer: Self-pay | Admitting: Family Medicine

## 2020-04-26 ENCOUNTER — Ambulatory Visit (INDEPENDENT_AMBULATORY_CARE_PROVIDER_SITE_OTHER): Payer: Medicare HMO | Admitting: Family Medicine

## 2020-04-26 VITALS — BP 118/64 | HR 85 | Temp 97.3°F | Resp 18 | Wt 183.0 lb

## 2020-04-26 DIAGNOSIS — E7849 Other hyperlipidemia: Secondary | ICD-10-CM

## 2020-04-26 DIAGNOSIS — R5383 Other fatigue: Secondary | ICD-10-CM | POA: Diagnosis not present

## 2020-04-26 DIAGNOSIS — K5900 Constipation, unspecified: Secondary | ICD-10-CM

## 2020-04-26 DIAGNOSIS — D649 Anemia, unspecified: Secondary | ICD-10-CM | POA: Diagnosis not present

## 2020-04-26 DIAGNOSIS — R5381 Other malaise: Secondary | ICD-10-CM | POA: Diagnosis not present

## 2020-04-26 DIAGNOSIS — B351 Tinea unguium: Secondary | ICD-10-CM

## 2020-04-26 DIAGNOSIS — I10 Essential (primary) hypertension: Secondary | ICD-10-CM

## 2020-04-26 DIAGNOSIS — E1121 Type 2 diabetes mellitus with diabetic nephropathy: Secondary | ICD-10-CM

## 2020-04-26 DIAGNOSIS — G4719 Other hypersomnia: Secondary | ICD-10-CM | POA: Diagnosis not present

## 2020-04-26 DIAGNOSIS — R569 Unspecified convulsions: Secondary | ICD-10-CM

## 2020-04-26 DIAGNOSIS — E559 Vitamin D deficiency, unspecified: Secondary | ICD-10-CM

## 2020-04-27 LAB — COMPREHENSIVE METABOLIC PANEL
ALT: 16 IU/L (ref 0–32)
AST: 12 IU/L (ref 0–40)
Albumin/Globulin Ratio: 1.1 — ABNORMAL LOW (ref 1.2–2.2)
Albumin: 4.1 g/dL (ref 3.6–4.6)
Alkaline Phosphatase: 69 IU/L (ref 48–121)
BUN/Creatinine Ratio: 11 — ABNORMAL LOW (ref 12–28)
BUN: 16 mg/dL (ref 8–27)
Bilirubin Total: 0.2 mg/dL (ref 0.0–1.2)
CO2: 22 mmol/L (ref 20–29)
Calcium: 10.6 mg/dL — ABNORMAL HIGH (ref 8.7–10.3)
Chloride: 100 mmol/L (ref 96–106)
Creatinine, Ser: 1.41 mg/dL — ABNORMAL HIGH (ref 0.57–1.00)
GFR calc Af Amer: 39 mL/min/{1.73_m2} — ABNORMAL LOW (ref >59)
GFR calc non Af Amer: 34 mL/min/{1.73_m2} — ABNORMAL LOW (ref >59)
Globulin, Total: 3.7 g/dL (ref 1.5–4.5)
Glucose: 147 mg/dL — ABNORMAL HIGH (ref 65–99)
Potassium: 4.4 mmol/L (ref 3.5–5.2)
Sodium: 136 mmol/L (ref 134–144)
Total Protein: 7.8 g/dL (ref 6.0–8.5)

## 2020-04-27 LAB — CBC
Hematocrit: 33.2 % — ABNORMAL LOW (ref 34.0–46.6)
Hemoglobin: 11.1 g/dL (ref 11.1–15.9)
MCH: 30.7 pg (ref 26.6–33.0)
MCHC: 33.4 g/dL (ref 31.5–35.7)
MCV: 92 fL (ref 79–97)
Platelets: 245 10*3/uL (ref 150–450)
RBC: 3.62 x10E6/uL — ABNORMAL LOW (ref 3.77–5.28)
RDW: 12.8 % (ref 11.7–15.4)
WBC: 7.3 10*3/uL (ref 3.4–10.8)

## 2020-04-27 LAB — LIPID PANEL
Chol/HDL Ratio: 3.8 ratio (ref 0.0–4.4)
Cholesterol, Total: 173 mg/dL (ref 100–199)
HDL: 45 mg/dL (ref >39)
LDL Chol Calc (NIH): 104 mg/dL — ABNORMAL HIGH (ref 0–99)
Triglycerides: 137 mg/dL (ref 0–149)
VLDL Cholesterol Cal: 24 mg/dL (ref 5–40)

## 2020-04-27 LAB — TSH: TSH: 1.68 u[IU]/mL (ref 0.450–4.500)

## 2020-04-27 LAB — VITAMIN D 25 HYDROXY (VIT D DEFICIENCY, FRACTURES): Vit D, 25-Hydroxy: 66.7 ng/mL (ref 30.0–100.0)

## 2020-04-27 LAB — HEMOGLOBIN A1C
Est. average glucose Bld gHb Est-mCnc: 163 mg/dL
Hgb A1c MFr Bld: 7.3 % — ABNORMAL HIGH (ref 4.8–5.6)

## 2020-04-27 LAB — T4, FREE: Free T4: 1.14 ng/dL (ref 0.82–1.77)

## 2020-04-29 NOTE — Chronic Care Management (AMB) (Signed)
°  Chronic Care Management   Note   Name: Briana Austin MRN: [id number] DOB: [birth year]     Brief outreach with Briana Austin. Primary concern is assistance with Respite Care. Reports Briana Austin is currently assisting with [name] care and planning a surgical procedure within the next few months. Helene Austin reports Briana Austin cannot be left alone and will require supervision. She confirmed that Briana Austin does not qualify for Medicaid. Prefers in-home assistance versus Adult Day Care.      Follow up plan: Will complete outreach with local agencies and follow-up with Helene Austin to complete the assessment within the next two weeks.    Briana Austin [facility] Care Management [phone]

## 2020-05-02 ENCOUNTER — Ambulatory Visit: Payer: Self-pay

## 2020-05-02 NOTE — Chronic Care Management (AMB) (Signed)
  Chronic Care Management   Note  05/02/2020 Name: MEILING HENDRIKS MRN: [id number] DOB: 04-01-1934  Care Coordination Only:  Contacted the following agencies to confirm availability of respite care services: -[facility])     Follow up plan: The care management team will forward the list of agencies to Mrs. Molnar's caregiver/daughter and follow-up next week.     Horris Latino [facility] Care Management [phone]

## 2020-05-03 ENCOUNTER — Ambulatory Visit: Payer: Self-pay | Admitting: Family Medicine

## 2020-05-06 ENCOUNTER — Other Ambulatory Visit: Payer: Self-pay | Admitting: Family Medicine

## 2020-05-06 DIAGNOSIS — E119 Type 2 diabetes mellitus without complications: Secondary | ICD-10-CM

## 2020-05-07 ENCOUNTER — Ambulatory Visit: Payer: Self-pay

## 2020-05-07 ENCOUNTER — Telehealth: Payer: Self-pay

## 2020-05-13 ENCOUNTER — Ambulatory Visit: Payer: Medicare HMO | Admitting: Podiatry

## 2020-05-13 ENCOUNTER — Other Ambulatory Visit: Payer: Self-pay

## 2020-05-13 ENCOUNTER — Encounter: Payer: Self-pay | Admitting: Podiatry

## 2020-05-13 DIAGNOSIS — M79674 Pain in right toe(s): Secondary | ICD-10-CM | POA: Diagnosis not present

## 2020-05-13 DIAGNOSIS — B351 Tinea unguium: Secondary | ICD-10-CM | POA: Diagnosis not present

## 2020-05-13 DIAGNOSIS — M79675 Pain in left toe(s): Secondary | ICD-10-CM | POA: Diagnosis not present

## 2020-05-13 NOTE — Progress Notes (Signed)
This patient presents  to my office for at risk foot care.  This patient requires this care by a professional since this patient will be at risk due to having diabetes mellitus.   This patient is unable to cut nails herself since the patient cannot reach her nails.These nails are painful walking and wearing shoes.  This patient presents for at risk foot care today.  General Appearance  Alert, conversant and in no acute stress.  Vascular  Dorsalis pedis  are palpable  bilaterally. Posterior tibial pulses are absent  B/L. Capillary return is within normal limits  bilaterally. Temperature is within normal limits  bilaterally.  Neurologic Deferred.. Muscle power within normal limits bilaterally.  Nails Thick disfigured discolored nails with subungual debris  Hallux  B/L. No evidence of bacterial infection or drainage bilaterally.  Orthopedic  No limitations of motion  feet .  No crepitus or effusions noted.  No bony pathology or digital deformities noted.  Skin  normotropic skin with no porokeratosis noted bilaterally.  No signs of infections or ulcers noted.     Onychomycosis  Pain in right toes  Pain in left toes  Consent was obtained for treatment procedures.   Mechanical debridement of nails  bilaterally performed with a nail nipper.  Filed with dremel without incident. No evidence of neuropathy with decreased vascular findings.   Return office visit    3 months                  Told patient to return for periodic foot care and evaluation due to potential at risk complications.   Real Cona DPM  

## 2020-05-16 ENCOUNTER — Ambulatory Visit: Payer: Self-pay | Admitting: Family Medicine

## 2020-05-16 NOTE — Telephone Encounter (Signed)
Pt's daughter [name] calling. States pt fell in BR this am, unwitnessed, states she believes pt was standing at sink, states "Floor was slippery." Called EMS to assist with getting pt up off floor, was examined.   Reports pt immediately ambulatory with min assist. "Does not seem to be in any pain, walking, moving everything". States no noted abrasions or lacerations, no red areas or swelling. Presently eating breakfast, had bath with assist of aide. Advised ED for eval as fall was unwitnessed, declined. Daughter states she will monitor her closely today and if any signs of injury, dizziness, increased weakness, fatigue, will go to ED. Assured NT would route to practice for PCPs review.  Reason for Disposition  Injury (or injuries) that need emergency care    Per daughter no obvious injuries, pt non-verbal  Answer Assessment - Initial Assessment Questions 1. MECHANISM: "How did the fall happen?"     Standing and fell, but unwitnessed 2. DOMESTIC VIOLENCE AND ELDER ABUSE SCREENING: "Did you fall because someone pushed you or tried to hurt you?" If Yes, ask: "Are you safe now?"     *No Answer* 3. ONSET: "When did the fall happen?" (e.g., minutes, hours, or days ago)     *No Answer* 4. LOCATION: "What part of the body hit the ground?" (e.g., back, buttocks, head, hips, knees, hands, head, stomach)     *No Answer* 5. INJURY: "Did you hurt (injure) yourself when you fell?" If Yes, ask: "What did you injure? Tell me more about this?" (e.g., body area; type of injury; pain severity)"     *No Answer* 6. PAIN: "Is there any pain?" If Yes, ask: "How bad is the pain?" (e.g., Scale 1-10; or mild,  moderate, severe)   - NONE (0): no pain   - MILD (1-3): doesn't interfere with normal activities    - MODERATE (4-7): interferes with normal activities or awakens from sleep    - SEVERE (8-10): excruciating pain, unable to do any normal activities      7. SIZE: For cuts, bruises, or swelling, ask: "How  large is it?" (e.g., inches or centimeters)     None noted 9. OTHER SYMPTOMS: "Do you have any other symptoms?" (e.g., dizziness, fever, weakness; new onset or worsening).       10. CAUSE: "What do you think caused the fall (or falling)?" (e.g., tripped, dizzy spell)  Protocols used: FALLS AND FALLING-A-AH

## 2020-05-20 NOTE — Chronic Care Management (AMB) (Signed)
  Chronic Care Management   Note   Name: Briana Austin MRN: [id number] DOB: [birth year]   Brief outreach with Mrs. Sommers's daughter [name]. Confirmed receipt of agencies that provide respite care. Aware that may of the agencies require an initial in-home assessment prior to providing services.  Will update the care management team after the initial evaluation.  Horris Latino [facility] Care Management [phone]

## 2020-05-21 ENCOUNTER — Ambulatory Visit: Payer: Self-pay

## 2020-05-22 NOTE — Chronic Care Management (AMB) (Signed)
  Chronic Care Management   Note   Name: KENLEA WOODELL MRN: [id number] DOB: [birth year]    Outreach with Mrs. Kisling's daughter [name] to discuss care needs. [name] was out of the area at the time of the call. Prefers outreach later this week. Will plan discuss on 05/23/20.    Follow up plan: The care management team will reach out to [place] on 05/23/20.   Horris Latino [facility] Care Management [phone]

## 2020-05-23 ENCOUNTER — Telehealth: Payer: Self-pay

## 2020-05-29 ENCOUNTER — Other Ambulatory Visit: Payer: Self-pay | Admitting: Family Medicine

## 2020-05-29 NOTE — Telephone Encounter (Signed)
Requested medication (s) are due for refill today:   Provider to review for refill  Requested medication (s) are on the active medication list:   Yes  Future visit scheduled:   No   Last ordered: Non delegated refill   Requested Prescriptions  Pending Prescriptions Disp Refills   Cholecalciferol (VITAMIN D3) 1.25 MG (50000 UT) CAPS [Pharmacy Med Name: Vitamin D3 1.25 MG (50000 UT) Oral Capsule] 12 capsule 0    Sig: Take 1 capsule by mouth once a week      Endocrinology:  Vitamins - Vitamin D Supplementation Failed - 05/29/2020 10:59 AM      Failed - 50,000 IU strengths are not delegated      Failed - Ca in normal range and within 360 days    Calcium  Date Value Ref Range Status  04/26/2020 10.6 (H) 8.7 - 10.3 mg/dL Final   Calcium, Total  Date Value Ref Range Status  01/25/2014 10.0 8.5 - 10.1 mg/dL Final   Calcium, Ion  Date Value Ref Range Status  07/03/2015 1.27 1.13 - 1.30 mmol/L Final          Failed - Phosphate in normal range and within 360 days    Phosphorus  Date Value Ref Range Status  02/20/2016 3.0 2.5 - 4.5 mg/dL Final          Passed - Vitamin D in normal range and within 360 days    Vitamin D 1, 25 (OH)2 Total  Date Value Ref Range Status  09/14/2014 61.1  Final   Vit D, 25-Hydroxy  Date Value Ref Range Status  04/26/2020 66.7 30.0 - 100.0 ng/mL Final    Comment:    Vitamin D deficiency has been defined by the Institute of Medicine and an Endocrine Society practice guideline as a level of serum 25-OH vitamin D less than 20 ng/mL (1,2). The Endocrine Society went on to further define vitamin D insufficiency as a level between 21 and 29 ng/mL (2). 1. IOM (Institute of Medicine). 2010. Dietary reference    intakes for calcium and D. [place]: The    [employer]. 2. Holick MF, Bink[place], Bischoff-Ferrari HA, et al.    Evaluation, treatment, and prevention of vitamin D    deficiency: an Endocrine Society clinical practice     guideline. JCEM. 2011 Jul; 96(7):1911-30.           Passed - Valid encounter within last 12 months    Recent Outpatient Visits           1 month ago Other fatigue   [facility] Birdie Sons, MD   2 months ago Hemorrhoids, unspecified hemorrhoid type   [place] [place], PA-C   4 months ago Yeast infection of the skin   [facility], Kelby Aline, FNP   4 months ago Controlled diabetes mellitus with nephropathy [facility])   [facility] Birdie Sons, MD   9 months ago Controlled diabetes mellitus with nephropathy [facility])   [facility] Birdie Sons, MD

## 2020-06-12 ENCOUNTER — Ambulatory Visit: Payer: Self-pay

## 2020-06-12 DIAGNOSIS — F0280 Dementia in other diseases classified elsewhere without behavioral disturbance: Secondary | ICD-10-CM

## 2020-06-12 NOTE — Chronic Care Management (AMB) (Signed)
  Chronic Care Management   Outreach Note  06/12/2020 Name: Briana Austin MRN: [id number] DOB: [birth year]  Primary Care Provider: Birdie Sons, MD Reason for referral : Chronic Care Management   Briana Austin was  referred to the case management team for assistance with obtaining respite services following her spouse's surgery.  Her spouse currently serves as the primary caregiver.   Successful outreach with their daughter [name] today. Per [name], Mr. Willy is pending surgery in October and anticipating skilled rehabilitation services immediately after.  States he prefers that family members stay with Briana Austin during his rehabilitation. Respite services will not be required.     PLAN No further outreach required at this time. [name] will notify the care management team if additional assistance is needed.    Horris Latino [facility] Practice[facility] Care Management [phone]

## 2020-07-22 ENCOUNTER — Other Ambulatory Visit: Payer: Self-pay | Admitting: Family Medicine

## 2020-07-22 DIAGNOSIS — E1121 Type 2 diabetes mellitus with diabetic nephropathy: Secondary | ICD-10-CM

## 2020-08-07 ENCOUNTER — Other Ambulatory Visit: Payer: Self-pay

## 2020-08-07 ENCOUNTER — Ambulatory Visit (INDEPENDENT_AMBULATORY_CARE_PROVIDER_SITE_OTHER): Payer: Medicare HMO

## 2020-08-07 DIAGNOSIS — Z23 Encounter for immunization: Secondary | ICD-10-CM | POA: Diagnosis not present

## 2020-08-07 NOTE — Progress Notes (Signed)
Subjective:   Briana Austin is a 86 y.o. female who presents for Medicare Annual (Subsequent) preventive examination.  I connected with Ignatius Specking (husband/POA) today by telephone and verified that I am speaking with the correct person using two identifiers. Location patient: home Location provider: work Persons participating in the virtual visit: patient, provider.   I discussed the limitations, risks, security and privacy concerns of performing an evaluation and management service by telephone and the availability of in person appointments. I also discussed with the patient that there may be a patient responsible charge related to this service. The patient expressed understanding and verbally consented to this telephonic visit.    Interactive audio and video telecommunications were attempted between this provider and patient, however failed, due to patient having technical difficulties OR patient did not have access to video capability.  We continued and completed visit with audio only.   Review of Systems    N/A  Cardiac Risk Factors include: advanced age (>36mn, >>74women);diabetes mellitus;dyslipidemia;hypertension     Objective:    There were no vitals filed for this visit. There is no height or weight on file to calculate BMI.  Advanced Directives 08/08/2020 08/03/2019 04/02/2017 03/09/2017 07/05/2015 06/08/2015 06/08/2015  Does Patient Have a Medical Advance Directive? Yes No Yes Yes Yes Yes No;Yes  Type of AAcademic librarian- - HPersonal assistantPower of AFreescale SemiconductorPower of Attorney  Does patient want to make changes to medical advance directive? - - - - No - Patient declined No - Patient declined -  Copy of HKnightdalein Chart? No - copy requested - - No - copy requested No - copy requested No - copy requested No - copy requested  Would patient like information on creating a  medical advance directive? - No - Patient declined - - No - patient declined information No - patient declined information No - patient declined information    Current Medications (verified) Outpatient Encounter Medications as of 08/08/2020  Medication Sig  . Accu-Chek Softclix Lancets lancets USE AS DIRECTED  . aspirin EC 81 MG tablet Take 81 mg by mouth daily.  . Blood Glucose Monitoring Suppl (ACCU-CHEK AVIVA PLUS) w/Device KIT AS DIRECTED  . calcium carbonate (OS-CAL) 600 MG TABS tablet Take 1 tablet by mouth daily.  .Marland KitchenglipiZIDE (GLUCOTROL XL) 5 MG 24 hr tablet Take 1 tablet by mouth once daily with breakfast  . glucose blood test strip USE TO CHECK GLUCOSE ONCE DAILY FOR TYPE 2 DIABETES E11.9  . levETIRAcetam (KEPPRA) 500 MG tablet Take 1 tablet by mouth twice daily  . lisinopril (ZESTRIL) 20 MG tablet Take 1 tablet by mouth once daily  . metFORMIN (GLUCOPHAGE) 500 MG tablet Take 1 tablet by mouth in the evening  . montelukast (SINGULAIR) 10 MG tablet Take 1 tablet (10 mg total) by mouth daily.  .Marland KitchenNAMZARIC 28-10 MG CP24 Take 1 capsule by mouth once daily  . ONE TOUCH LANCETS MISC 1 Device by Does not apply route daily.  . potassium chloride (KLOR-CON) 10 MEQ tablet Take 1 tablet (10 mEq total) by mouth daily.  . pravastatin (PRAVACHOL) 40 MG tablet Take 1 tablet by mouth once daily  . vitamin B-12 1000 MCG tablet Take 1 tablet (1,000 mcg total) by mouth daily.  . Vitamin D, Ergocalciferol, (DRISDOL) 1.25 MG (50000 UT) CAPS capsule Take 1 capsule by mouth once a week  . Cholecalciferol (VITAMIN  D3) 1.25 MG (50000 UT) CAPS Take 1 capsule by mouth once a week (Patient not taking: Reported on 08/08/2020)  . feeding supplement, ENSURE ENLIVE, (ENSURE ENLIVE) LIQD Take 237 mLs by mouth 2 (two) times daily between meals. (Patient not taking: Reported on 08/08/2020)  . potassium chloride (KLOR-CON) 10 MEQ tablet  (Patient not taking: Reported on 08/08/2020)   No facility-administered encounter  medications on file as of 08/08/2020.    Allergies (verified) Patient has no known allergies.   History: Past Medical History:  Diagnosis Date  . Breast cancer ([place]) 2005   positive, radiation  . CAP (community acquired pneumonia) 06/14/2015  . Dementia ([place])   . Diabetes mellitus without complication ([place])   . History of breast cancer   . Hyperlipidemia   . Hypertension   . Personal history of radiation therapy   . Seizure [facility])    Past Surgical History:  Procedure Laterality Date  . ABDOMINAL HYSTERECTOMY    . BREAST BIOPSY Right    negative 2011  . BREAST EXCISIONAL BIOPSY Right 2005   positive   . BREAST LUMPECTOMY Right 2005   Carcinoma in situ of right breast  . sigmoid polyp excision  11/24/2004   Hyperplastic polyp  . TOTAL ABDOMINAL HYSTERECTOMY W/ BILATERAL SALPINGOOPHORECTOMY  1981   Benign   Family History  Problem Relation Age of Onset  . Stroke Mother   . Dementia Brother   . Breast cancer Neg Hx    Social History   Socioeconomic History  . Marital status: Married    Spouse name: Not on file  . Number of children: 1  . Years of education: HS Grad  . Highest education level: High school graduate  Occupational History  . Occupation: Retired    Comment: Designer, multimedia  Tobacco Use  . Smoking status: Former Smoker    Packs/day: 0.50    Years: 8.00    Pack years: 4.00    Types: Cigarettes  . Smokeless tobacco: Never Used  . Tobacco comment: > 50 years ago  Vaping Use  . Vaping Use: Never used  Substance and Sexual Activity  . Alcohol use: No    Alcohol/week: 0.0 standard drinks  . Drug use: No  . Sexual activity: Never  Other Topics Concern  . Not on file  Social History Narrative   Lives at home with her husband.   Right-handed.   1 cup caffeine daily.   Social Determinants of Health   Financial Resource Strain: Low Risk   . Difficulty of Paying Living Expenses: Not hard at all  Food Insecurity: No Food Insecurity  . Worried  About Charity fundraiser in the Last Year: Never true  . Ran Out of Food in the Last Year: Never true  Transportation Needs: No Transportation Needs  . Lack of Transportation (Medical): No  . Lack of Transportation (Non-Medical): No  Physical Activity: Inactive  . Days of Exercise per Week: 0 days  . Minutes of Exercise per Session: 0 min  Stress: No Stress Concern Present  . Feeling of Stress : Not at all  Social Connections: Moderately Isolated  . Frequency of Communication with Friends and Family: Never  . Frequency of Social Gatherings with Friends and Family: More than three times a week  . Attends Religious Services: Never  . Active Member of Clubs or Organizations: No  . Attends Archivist Meetings: Never  . Marital Status: Married    Tobacco Counseling Counseling given: Not Answered  Comment: > 50 years ago   Clinical Intake:  Pre-visit preparation completed: Yes  Pain : No/denies pain     Nutritional Risks: None Diabetes: Yes  How often do you need to have someone help you when you read instructions, pamphlets, or other written materials from your doctor or pharmacy?: 1 - Never  Diabetic? Yes  Nutrition Risk Assessment:  Has the patient had any N/V/D within the last 2 months?  No  Does the patient have any non-healing wounds?  No  Has the patient had any unintentional weight loss or weight gain?  No   Diabetes:  Is the patient diabetic?  Yes  If diabetic, was a CBG obtained today?  No  Did the patient bring in their glucometer from home?  No  How often do you monitor your CBG's? Once a day before breakfast.   Financial Strains and Diabetes Management:  Are you having any financial strains with the device, your supplies or your medication? No .  Does the patient want to be seen by Chronic Care Management for management of their diabetes?  No  Would the patient like to be referred to a Nutritionist or for Diabetic Management?  No   Diabetic  Exams:  Diabetic Eye Exam: Completed 09/05/19 Diabetic Foot Exam: Completed 05/13/20   Interpreter Needed?: No  Information entered by :: [facility], LPN   Activities of Daily Living In your present state of health, do you have any difficulty performing the following activities: 08/08/2020  Hearing? N  Vision? N  Difficulty concentrating or making decisions? Y  Comment Has dementia.  Walking or climbing stairs? N  Dressing or bathing? Y  Comment Has assistance bathing.  Doing errands, shopping? Y  Comment Does not drive.  Preparing Food and eating ? Y  Comment Does not cook.  Using the Toilet? N  In the past six months, have you accidently leaked urine? Y  Comment Wear depends at all times.  Do you have problems with loss of bowel control? N  Managing your Medications? Y  Comment Husband manages all medications.  Managing your Finances? Y  Comment Husband manages finances.  Housekeeping or managing your Housekeeping? Y  Comment Does not clean.  Some recent data might be hidden    Patient Care Team: Birdie Sons, MD as PCP - General (Family Medicine) Dingeldein, Remo Lipps, MD as Consulting Physician (Ophthalmology) Gardiner Barefoot, DPM as Consulting Physician (Podiatry)  Indicate any recent Medical Services you may have received from other than [facility] providers in the past year (date may be approximate).     Assessment:   This is a routine wellness examination for [place].  Hearing/Vision screen No exam data present  Dietary issues and exercise activities discussed: Current Exercise Habits: The patient does not participate in regular exercise at present, Exercise limited by: psychological condition(s)  Goals    . Increase water intake     Recommend increasing water intake to 4 glasses of water every day.    . Prevent falls     Recommend to remove any items from the home that may cause slips or trips.      Depression Screen PHQ 2/9 Scores 08/08/2020 08/03/2019  07/26/2018 07/09/2017 04/02/2017 03/09/2017 12/28/2016  PHQ - 2 Score 0 0 _0 PHQ- 9 Score - - _1 Fall Risk Fall Risk  08/08/2020 01/01/2020 08/03/2019 07/26/2018 04/02/2017  Falls in the past year? 1 0 0 No No  Number falls in past yr: 0 0 0 - -  Injury with Fall? 0 0 0 - -  Follow up Falls prevention discussed Falls evaluation completed - - -    Any stairs in or around the home? Yes  If so, are there any without handrails? No  Home free of loose throw rugs in walkways, pet beds, electrical cords, etc? Yes  Adequate lighting in your home to reduce risk of falls? Yes   ASSISTIVE DEVICES UTILIZED TO PREVENT FALLS:  Life alert? No  Use of a cane, walker or w/c? Yes  Grab bars in the bathroom? Yes  Shower chair or bench in shower? Yes  Elevated toilet seat or a handicapped toilet? Yes    Cognitive Function: Unable to complete due to speaking with husband.   MMSE - Mini Mental State Exam 05/06/2016 10/29/2015  Orientation to time 0 2  Orientation to Place 3 3  Registration 3 3  Attention/ Calculation 3 3  Recall 0 0  Language- name 2 objects 2 2  Language- repeat 1 0  Language- follow 3 step command 3 2  Language- read & follow direction 1 1  Write a sentence 1 1  Copy design 1 0  Total score 18 17        Immunizations Immunization History  Administered Date(s) Administered  . Fluad Quad(high Dose 65+) 08/29/2019, 08/07/2020  . Influenza, High Dose Seasonal PF 07/26/2014, 08/13/2015, 08/22/2016, 07/09/2017, 07/26/2018  . PFIZER SARS-COV-2 Vaccination 11/22/2019, 12/16/2019  . Pneumococcal Conjugate-13 04/11/2014  . Pneumococcal Polysaccharide-23 09/21/2002    TDAP status: Due, Education has been provided regarding the importance of this vaccine. Advised may receive this vaccine at local pharmacy or Health Dept. Aware to provide a copy of the vaccination record if obtained from local pharmacy or Health Dept. Verbalized acceptance and understanding. Flu Vaccine  status: Up to date Pneumococcal vaccine status: Up to date Covid-19 vaccine status: Completed vaccines  Qualifies for Shingles Vaccine? Yes   Zostavax completed No   Shingrix Completed?: No.    Education has been provided regarding the importance of this vaccine. Patient has been advised to call insurance company to determine out of pocket expense if they have not yet received this vaccine. Advised may also receive vaccine at local pharmacy or Health Dept. Verbalized acceptance and understanding.  Screening Tests Health Maintenance  Topic Date Due  . TETANUS/TDAP  11/02/2026 (Originally 01/23/1953)  . OPHTHALMOLOGY EXAM  09/04/2020  . HEMOGLOBIN A1C  10/26/2020  . DEXA SCAN  10/30/2020  . FOOT EXAM  05/13/2021  . INFLUENZA VACCINE  Completed  . COVID-19 Vaccine  Completed  . PNA vac Low Risk Adult  Completed    Health Maintenance  There are no preventive care reminders to display for this patient.  Colorectal cancer screening: No longer required.  Mammogram status: No longer required.  Bone Density status: Completed 10/31/15. Results reflect: Bone density results: OSTEOPENIA. Repeat every 5 years.  Lung Cancer Screening: (Low Dose CT Chest recommended if Age 94-80 years, 30 pack-year currently smoking OR have quit w/in 15years.) does not qualify.   Additional Screening:  Vision Screening: Recommended annual ophthalmology exams for early detection of glaucoma and other disorders of the eye. Is the patient up to date with their annual eye exam?  Yes  Who is the provider or what is the name of the office in which the patient attends annual eye exams? Dr Dingeldein @ [place] If pt is not established with a provider, would they  like to be referred to a provider to establish care? No .   Dental Screening: Recommended annual dental exams for proper oral hygiene  Community Resource Referral / Chronic Care Management: CRR required this visit?  No   CCM required this visit?  No        Plan:     I have personally reviewed and noted the following in the patient's chart:   . Medical and social history . Use of alcohol, tobacco or illicit drugs  . Current medications and supplements . Functional ability and status . Nutritional status . Physical activity . Advanced directives . List of other physicians . Hospitalizations, surgeries, and ER visits in previous 12 months . Vitals . Screenings to include cognitive, depression, and falls . Referrals and appointments  In addition, I have reviewed and discussed with patient certain preventive protocols, quality metrics, and best practice recommendations. A written personalized care plan for preventive services as well as general preventive health recommendations were provided to patient.      [place], [place]   96/05/[id number]   Nurse Notes: None.

## 2020-08-08 ENCOUNTER — Ambulatory Visit (INDEPENDENT_AMBULATORY_CARE_PROVIDER_SITE_OTHER): Payer: Medicare HMO

## 2020-08-08 DIAGNOSIS — Z0000 Encounter for general adult medical examination without abnormal findings: Secondary | ICD-10-CM | POA: Diagnosis not present

## 2020-08-08 NOTE — Patient Instructions (Addendum)
Briana Austin , Thank you for taking time to come for your Medicare Wellness Visit. I appreciate your ongoing commitment to your health goals. Please review the following plan we discussed and let me know if I can assist you in the future.   Screening recommendations/referrals: Colonoscopy: No longer required.  Mammogram: No longer required.  Bone Density: Up to date, due 10/2020 Recommended yearly ophthalmology/optometry visit for glaucoma screening and checkup Recommended yearly dental visit for hygiene and checkup  Vaccinations: Influenza vaccine: Done 08/07/20 Pneumococcal vaccine: Completed series Tdap vaccine: Currently due, declined at this time.  Shingles vaccine: Shingrix discussed. Please contact your pharmacy for coverage information.     Advanced directives: Please bring a copy of your POA (Power of Attorney) and/or Living Will to your next appointment.   Conditions/risks identified: Fall risk preventatives discussed today. Recommend to increase water intake to 6-8 8 oz glasses a day.   Next appointment: 08/14/20 @ 9:00 AM for an AWV. Husband to call back and schedule follow up with PCP.   Preventive Care 26 Years and Older, Female Preventive care refers to lifestyle choices and visits with your health care provider that can promote health and wellness. What does preventive care include?  A yearly physical exam. This is also called an annual well check.  Dental exams once or twice a year.  Routine eye exams. Ask your health care provider how often you should have your eyes checked.  Personal lifestyle choices, including:  Daily care of your teeth and gums.  Regular physical activity.  Eating a healthy diet.  Avoiding tobacco and drug use.  Limiting alcohol use.  Practicing safe sex.  Taking low-dose aspirin every day.  Taking vitamin and mineral supplements as recommended by your health care provider. What happens during an annual well check? The services  and screenings done by your health care provider during your annual well check will depend on your age, overall health, lifestyle risk factors, and family history of disease. Counseling  Your health care provider may ask you questions about your:  Alcohol use.  Tobacco use.  Drug use.  Emotional well-being.  Home and relationship well-being.  Sexual activity.  Eating habits.  History of falls.  Memory and ability to understand (cognition).  Work and work Statistician.  Reproductive health. Screening  You may have the following tests or measurements:  Height, weight, and BMI.  Blood pressure.  Lipid and cholesterol levels. These may be checked every 5 years, or more frequently if you are over 16 years old.  Skin check.  Lung cancer screening. You may have this screening every year starting at age 63 if you have a 30-pack-year history of smoking and currently smoke or have quit within the past 15 years.  Fecal occult blood test (FOBT) of the stool. You may have this test every year starting at age 16.  Flexible sigmoidoscopy or colonoscopy. You may have a sigmoidoscopy every 5 years or a colonoscopy every 10 years starting at age 51.  Hepatitis C blood test.  Hepatitis B blood test.  Sexually transmitted disease (STD) testing.  Diabetes screening. This is done by checking your blood sugar (glucose) after you have not eaten for a while (fasting). You may have this done every 1-3 years.  Bone density scan. This is done to screen for osteoporosis. You may have this done starting at age 61.  Mammogram. This may be done every 1-2 years. Talk to your health care provider about how often you should have  regular mammograms. Talk with your health care provider about your test results, treatment options, and if necessary, the need for more tests. Vaccines  Your health care provider may recommend certain vaccines, such as:  Influenza vaccine. This is recommended every  year.  Tetanus, diphtheria, and acellular pertussis (Tdap, Td) vaccine. You may need a Td booster every 10 years.  Zoster vaccine. You may need this after age 35.  Pneumococcal 13-valent conjugate (PCV13) vaccine. One dose is recommended after age 26.  Pneumococcal polysaccharide (PPSV23) vaccine. One dose is recommended after age 76. Talk to your health care provider about which screenings and vaccines you need and how often you need them. This information is not intended to replace advice given to you by your health care provider. Make sure you discuss any questions you have with your health care provider. Document Released: 11/15/2015 Document Revised: 07/08/2016 Document Reviewed: 08/20/2015 Elsevier Interactive Patient Education  2017 [place] Prevention in the Home Falls can cause injuries. They can happen to people of all ages. There are many things you can do to make your home safe and to help prevent falls. What can I do on the outside of my home?  Regularly fix the edges of walkways and driveways and fix any cracks.  Remove anything that might make you trip as you walk through a door, such as a raised step or threshold.  Trim any bushes or trees on the path to your home.  Use bright outdoor lighting.  Clear any walking paths of anything that might make someone trip, such as rocks or tools.  Regularly check to see if handrails are loose or broken. Make sure that both sides of any steps have handrails.  Any raised decks and porches should have guardrails on the edges.  Have any leaves, snow, or ice cleared regularly.  Use sand or salt on walking paths during winter.  Clean up any spills in your garage right away. This includes oil or grease spills. What can I do in the bathroom?  Use night lights.  Install grab bars by the toilet and in the tub and shower. Do not use towel bars as grab bars.  Use non-skid mats or decals in the tub or shower.  If you  need to sit down in the shower, use a plastic, non-slip stool.  Keep the floor dry. Clean up any water that spills on the floor as soon as it happens.  Remove soap buildup in the tub or shower regularly.  Attach bath mats securely with double-sided non-slip rug tape.  Do not have throw rugs and other things on the floor that can make you trip. What can I do in the bedroom?  Use night lights.  Make sure that you have a light by your bed that is easy to reach.  Do not use any sheets or blankets that are too big for your bed. They should not hang down onto the floor.  Have a firm chair that has side arms. You can use this for support while you get dressed.  Do not have throw rugs and other things on the floor that can make you trip. What can I do in the kitchen?  Clean up any spills right away.  Avoid walking on wet floors.  Keep items that you use a lot in easy-to-reach places.  If you need to reach something above you, use a strong step stool that has a grab bar.  Keep electrical cords out of the  way.  Do not use floor polish or wax that makes floors slippery. If you must use wax, use non-skid floor wax.  Do not have throw rugs and other things on the floor that can make you trip. What can I do with my stairs?  Do not leave any items on the stairs.  Make sure that there are handrails on both sides of the stairs and use them. Fix handrails that are broken or loose. Make sure that handrails are as long as the stairways.  Check any carpeting to make sure that it is firmly attached to the stairs. Fix any carpet that is loose or worn.  Avoid having throw rugs at the top or bottom of the stairs. If you do have throw rugs, attach them to the floor with carpet tape.  Make sure that you have a light switch at the top of the stairs and the bottom of the stairs. If you do not have them, ask someone to add them for you. What else can I do to help prevent falls?  Wear shoes  that:  Do not have high heels.  Have rubber bottoms.  Are comfortable and fit you well.  Are closed at the toe. Do not wear sandals.  If you use a stepladder:  Make sure that it is fully opened. Do not climb a closed stepladder.  Make sure that both sides of the stepladder are locked into place.  Ask someone to hold it for you, if possible.  Clearly mark and make sure that you can see:  Any grab bars or handrails.  First and last steps.  Where the edge of each step is.  Use tools that help you move around (mobility aids) if they are needed. These include:  Canes.  Walkers.  Scooters.  Crutches.  Turn on the lights when you go into a dark area. Replace any light bulbs as soon as they burn out.  Set up your furniture so you have a clear path. Avoid moving your furniture around.  If any of your floors are uneven, fix them.  If there are any pets around you, be aware of where they are.  Review your medicines with your doctor. Some medicines can make you feel dizzy. This can increase your chance of falling. Ask your doctor what other things that you can do to help prevent falls. This information is not intended to replace advice given to you by your health care provider. Make sure you discuss any questions you have with your health care provider. Document Released: 08/15/2009 Document Revised: 03/26/2016 Document Reviewed: 11/23/2014 Elsevier Interactive Patient Education  2017 [employer].

## 2020-08-22 ENCOUNTER — Ambulatory Visit: Payer: Medicare HMO | Admitting: Podiatry

## 2020-08-26 ENCOUNTER — Other Ambulatory Visit: Payer: Self-pay | Admitting: Family Medicine

## 2020-08-26 ENCOUNTER — Ambulatory Visit: Payer: Medicare HMO | Admitting: Podiatry

## 2020-08-26 ENCOUNTER — Encounter: Payer: Self-pay | Admitting: Podiatry

## 2020-08-26 ENCOUNTER — Other Ambulatory Visit: Payer: Self-pay

## 2020-08-26 DIAGNOSIS — E1121 Type 2 diabetes mellitus with diabetic nephropathy: Secondary | ICD-10-CM

## 2020-08-26 DIAGNOSIS — M79675 Pain in left toe(s): Secondary | ICD-10-CM | POA: Diagnosis not present

## 2020-08-26 DIAGNOSIS — B351 Tinea unguium: Secondary | ICD-10-CM

## 2020-08-26 DIAGNOSIS — M79674 Pain in right toe(s): Secondary | ICD-10-CM

## 2020-08-26 NOTE — Progress Notes (Signed)
This patient presents  to my office for at risk foot care.  This patient requires this care by a professional since this patient will be at risk due to having diabetes mellitus.   This patient is unable to cut nails herself since the patient cannot reach her nails.These nails are painful walking and wearing shoes.  This patient presents for at risk foot care today.  General Appearance  Alert, conversant and in no acute stress.  Vascular  Dorsalis pedis  are palpable  bilaterally. Posterior tibial pulses are absent  B/L. Capillary return is within normal limits  bilaterally. Temperature is within normal limits  bilaterally.  Neurologic Deferred.. Muscle power within normal limits bilaterally.  Nails Thick disfigured discolored nails with subungual debris  Hallux  B/L. No evidence of bacterial infection or drainage bilaterally.  Orthopedic  No limitations of motion  feet .  No crepitus or effusions noted.  No bony pathology or digital deformities noted.  Skin  normotropic skin with no porokeratosis noted bilaterally.  No signs of infections or ulcers noted.     Onychomycosis  Pain in right toes  Pain in left toes  Consent was obtained for treatment procedures.   Mechanical debridement of nails  bilaterally performed with a nail nipper.  Filed with dremel without incident. No evidence of neuropathy with decreased vascular findings.   Return office visit    3 months                  Told patient to return for periodic foot care and evaluation due to potential at risk complications.   Gardiner Barefoot DPM

## 2020-08-26 NOTE — Telephone Encounter (Signed)
Requested Prescriptions  Pending Prescriptions Disp Refills  . potassium chloride (KLOR-CON) 10 MEQ tablet [Pharmacy Med Name: Potassium Chloride ER 10 MEQ Oral Tablet Extended Release] 90 tablet 0    Sig: Take 1 tablet by mouth once daily     Endocrinology:  Minerals - Potassium Supplementation Failed - 08/26/2020 10:50 AM      Failed - Cr in normal range and within 360 days    Creat  Date Value Ref Range Status  10/18/2017 1.37 (H) 0.60 - 0.88 mg/dL Final    Comment:    For patients >35 years of age, the reference limit for Creatinine is approximately 13% higher for people identified as African-American. .    Creatinine, Ser  Date Value Ref Range Status  04/26/2020 1.41 (H) 0.57 - 1.00 mg/dL Final         Passed - K in normal range and within 360 days    Potassium  Date Value Ref Range Status  04/26/2020 4.4 3.5 - 5.2 mmol/L Final  09/14/2014 4.4 mmol/L Final         Passed - Valid encounter within last 12 months    Recent Outpatient Visits          4 months ago Other fatigue   [facility] Birdie Sons, MD   5 months ago Hemorrhoids, unspecified hemorrhoid type   [place], Adriana M, PA-C   7 months ago Yeast infection of the skin   [facility], Kelby Aline, FNP   7 months ago Controlled diabetes mellitus with nephropathy [facility])   [facility] Birdie Sons, MD   12 months ago Controlled diabetes mellitus with nephropathy [facility])   [facility] Birdie Sons, MD             . pravastatin (PRAVACHOL) 40 MG tablet [Pharmacy Med Name: Pravastatin Sodium 40 MG Oral Tablet] 90 tablet 2    Sig: Take 1 tablet by mouth once daily     Cardiovascular:  Antilipid - Statins Failed - 08/26/2020 10:50 AM      Failed - LDL in normal range and within 360 days    LDL Chol Calc (NIH)  Date Value Ref Range Status  04/26/2020 104 (H) 0 - 99 mg/dL Final         Passed - Total  Cholesterol in normal range and within 360 days    Cholesterol, Total  Date Value Ref Range Status  04/26/2020 173 100 - 199 mg/dL Final  09/14/2014 187  Final         Passed - HDL in normal range and within 360 days    HDL  Date Value Ref Range Status  04/26/2020 45 >39 mg/dL Final         Passed - Triglycerides in normal range and within 360 days    Triglycerides  Date Value Ref Range Status  04/26/2020 137 0 - 149 mg/dL Final  09/14/2014 132  Final         Passed - Patient is not pregnant      Passed - Valid encounter within last 12 months    Recent Outpatient Visits          4 months ago Other fatigue   [facility] Birdie Sons, MD   5 months ago Hemorrhoids, unspecified hemorrhoid type   [place], [place], PA-C   7 months ago Yeast infection of the skin   [facility] [place], Kelby Aline, FNP   7 months ago Controlled diabetes mellitus with nephropathy [facility])   [facility] Birdie Sons, MD   12 months ago Controlled diabetes mellitus with nephropathy [facility])   [facility] Caryn Section, Kirstie Peri, MD

## 2020-09-05 ENCOUNTER — Other Ambulatory Visit: Payer: Self-pay | Admitting: Family Medicine

## 2020-09-05 DIAGNOSIS — E119 Type 2 diabetes mellitus without complications: Secondary | ICD-10-CM

## 2020-09-11 DIAGNOSIS — E119 Type 2 diabetes mellitus without complications: Secondary | ICD-10-CM | POA: Diagnosis not present

## 2020-09-11 LAB — HM DIABETES EYE EXAM

## 2020-09-13 ENCOUNTER — Encounter: Payer: Self-pay | Admitting: Family Medicine

## 2020-09-18 ENCOUNTER — Other Ambulatory Visit: Payer: Self-pay | Admitting: Family Medicine

## 2020-09-25 ENCOUNTER — Other Ambulatory Visit: Payer: Self-pay | Admitting: Family Medicine

## 2020-09-25 DIAGNOSIS — E1121 Type 2 diabetes mellitus with diabetic nephropathy: Secondary | ICD-10-CM

## 2020-09-25 DIAGNOSIS — I10 Essential (primary) hypertension: Secondary | ICD-10-CM

## 2020-10-16 ENCOUNTER — Other Ambulatory Visit: Payer: Self-pay | Admitting: Family Medicine

## 2020-10-16 DIAGNOSIS — E119 Type 2 diabetes mellitus without complications: Secondary | ICD-10-CM

## 2020-10-17 ENCOUNTER — Other Ambulatory Visit: Payer: Self-pay | Admitting: Family Medicine

## 2020-10-17 NOTE — Telephone Encounter (Signed)
Requested medication (s) are due for refill today:   Provider to determine  Requested medication (s) are on the active medication list:   Yes  Future visit scheduled:   No   Last ordered: 08/09/2019 #180, 4 refills  Non delegated refill    Requested Prescriptions  Pending Prescriptions Disp Refills   levETIRAcetam (KEPPRA) 500 MG tablet [Pharmacy Med Name: levETIRAcetam 500 MG Oral Tablet] 180 tablet 0    Sig: Take 1 tablet by mouth twice daily      Not Delegated - Neurology:  Anticonvulsants Failed - 10/17/2020  2:33 PM      Failed - This refill cannot be delegated      Failed - HCT in normal range and within 360 days    Hematocrit  Date Value Ref Range Status  04/26/2020 33.2 (L) 34.0 - 46.6 % Final          Passed - HGB in normal range and within 360 days    Hemoglobin  Date Value Ref Range Status  04/26/2020 11.1 11.1 - 15.9 g/dL Final          Passed - PLT in normal range and within 360 days    Platelets  Date Value Ref Range Status  04/26/2020 245 150 - 450 x10E3/uL Final          Passed - WBC in normal range and within 360 days    WBC  Date Value Ref Range Status  04/26/2020 7.3 3.4 - 10.8 x10E3/uL Final  10/18/2017 9.1 3.8 - 10.8 Thousand/uL Final          Passed - Valid encounter within last 12 months    Recent Outpatient Visits           5 months ago Other fatigue   [facility] Birdie Sons, MD   7 months ago Hemorrhoids, unspecified hemorrhoid type   [place], [place], PA-C   9 months ago Yeast infection of the skin   [facility], Kelby Aline, FNP   9 months ago Controlled diabetes mellitus with nephropathy [facility])   [facility] Birdie Sons, MD   1 year ago Controlled diabetes mellitus with nephropathy [facility])   [facility] Caryn Section, Kirstie Peri, MD

## 2020-11-27 ENCOUNTER — Other Ambulatory Visit: Payer: Self-pay | Admitting: Family Medicine

## 2020-11-27 NOTE — Telephone Encounter (Signed)
Requested Prescriptions  Pending Prescriptions Disp Refills  . potassium chloride (KLOR-CON) 10 MEQ tablet [Pharmacy Med Name: Potassium Chloride [place] ER 10 MEQ Oral Tablet Extended Release] 90 tablet 0    Sig: Take 1 tablet by mouth once daily     Endocrinology:  Minerals - Potassium Supplementation Failed - 11/27/2020 11:54 AM      Failed - Cr in normal range and within 360 days    Creat  Date Value Ref Range Status  10/18/2017 1.37 (H) 0.60 - 0.88 mg/dL Final    Comment:    For patients >53 years of age, the reference limit for Creatinine is approximately 13% higher for people identified as African-American. .    Creatinine, Ser  Date Value Ref Range Status  04/26/2020 1.41 (H) 0.57 - 1.00 mg/dL Final         Passed - K in normal range and within 360 days    Potassium  Date Value Ref Range Status  04/26/2020 4.4 3.5 - 5.2 mmol/L Final  09/14/2014 4.4 mmol/L Final         Passed - Valid encounter within last 12 months    Recent Outpatient Visits          7 months ago Other fatigue   [facility] Birdie Sons, MD   8 months ago Hemorrhoids, unspecified hemorrhoid type   [employer], [place], PA-C   10 months ago Yeast infection of the skin   [facility], Kelby Aline, FNP   11 months ago Controlled diabetes mellitus with nephropathy [facility])   [facility] Birdie Sons, MD   1 year ago Controlled diabetes mellitus with nephropathy [facility])   [facility] Birdie Sons, MD

## 2020-12-02 ENCOUNTER — Encounter: Payer: Self-pay | Admitting: Podiatry

## 2020-12-02 ENCOUNTER — Ambulatory Visit: Payer: Medicare HMO | Admitting: Podiatry

## 2020-12-02 ENCOUNTER — Other Ambulatory Visit: Payer: Self-pay

## 2020-12-02 DIAGNOSIS — M79675 Pain in left toe(s): Secondary | ICD-10-CM | POA: Diagnosis not present

## 2020-12-02 DIAGNOSIS — F0280 Dementia in other diseases classified elsewhere without behavioral disturbance: Secondary | ICD-10-CM

## 2020-12-02 DIAGNOSIS — B351 Tinea unguium: Secondary | ICD-10-CM

## 2020-12-02 DIAGNOSIS — M79674 Pain in right toe(s): Secondary | ICD-10-CM | POA: Diagnosis not present

## 2020-12-02 DIAGNOSIS — G309 Alzheimer's disease, unspecified: Secondary | ICD-10-CM

## 2020-12-02 DIAGNOSIS — E1121 Type 2 diabetes mellitus with diabetic nephropathy: Secondary | ICD-10-CM

## 2020-12-02 NOTE — Progress Notes (Signed)
This patient presents  to my office for at risk foot care.  This patient requires this care by a professional since this patient will be at risk due to having diabetes mellitus.   This patient is unable to cut nails herself since the patient cannot reach her nails.These nails are painful walking and wearing shoes.  This patient presents for at risk foot care today.  General Appearance  Alert, conversant and in no acute stress.  Vascular  Dorsalis pedis  are palpable  bilaterally. Posterior tibial pulses are absent  B/L. Capillary return is within normal limits  bilaterally. Cold feet  Bilaterally.  Absent digital hair.  Neurologic Deferred.. Muscle power within normal limits bilaterally.  Nails Thick disfigured discolored nails with subungual debris  Hallux  B/L. No evidence of bacterial infection or drainage bilaterally.  Orthopedic  No limitations of motion  feet .  No crepitus or effusions noted.  No bony pathology or digital deformities noted.  Skin  normotropic skin with no porokeratosis noted bilaterally.  No signs of infections or ulcers noted.     Onychomycosis  Pain in right toes  Pain in left toes  Consent was obtained for treatment procedures.   Mechanical debridement of nails  bilaterally performed with a nail nipper.  Filed with dremel without incident. No evidence of neuropathy with decreased vascular findings.   Return office visit    4 months                  Told patient to return for periodic foot care and evaluation due to potential at risk complications.   Gardiner Barefoot DPM

## 2020-12-25 ENCOUNTER — Other Ambulatory Visit: Payer: Self-pay | Admitting: Family Medicine

## 2020-12-25 DIAGNOSIS — I10 Essential (primary) hypertension: Secondary | ICD-10-CM

## 2020-12-25 NOTE — Telephone Encounter (Signed)
No follow ups on file noted last OV, will refill x 3 months.

## 2021-01-15 ENCOUNTER — Other Ambulatory Visit: Payer: Self-pay | Admitting: Family Medicine

## 2021-01-15 DIAGNOSIS — E119 Type 2 diabetes mellitus without complications: Secondary | ICD-10-CM

## 2021-01-15 DIAGNOSIS — E1121 Type 2 diabetes mellitus with diabetic nephropathy: Secondary | ICD-10-CM

## 2021-01-15 NOTE — Telephone Encounter (Signed)
Requested medication (s) are due for refill today: yes  Requested medication (s) are on the active medication list: yes  Last refill:  12/11/2020  Future visit scheduled: no  Notes to clinic:  overdue for labs and follow up appt Patient had medicare wellness on 08/18/2020   Requested Prescriptions  Pending Prescriptions Disp Refills   glipiZIDE (GLUCOTROL XL) 5 MG 24 hr tablet [Pharmacy Med Name: glipiZIDE ER 5 MG Oral Tablet Extended Release 24 Hour] 90 tablet 0    Sig: Take 1 tablet by mouth once daily with breakfast      Endocrinology:  Diabetes - Sulfonylureas Failed - 01/15/2021 11:09 AM      Failed - HBA1C is between 0 and 7.9 and within 180 days    Hemoglobin A1C  Date Value Ref Range Status  01/10/2015 6.5  Final   Hgb A1c MFr Bld  Date Value Ref Range Status  04/26/2020 7.3 (H) 4.8 - 5.6 % Final    Comment:             Prediabetes: 5.7 - 6.4          Diabetes: >6.4          Glycemic control for adults with diabetes: <7.0           Failed - Valid encounter within last 6 months    Recent Outpatient Visits           8 months ago Other fatigue   [facility] Birdie Sons, MD   10 months ago Hemorrhoids, unspecified hemorrhoid type   [place], [place], PA-C   12 months ago Yeast infection of the skin   [facility], Kelby Aline, FNP   1 year ago Controlled diabetes mellitus with nephropathy [facility])   [facility] Birdie Sons, MD   1 year ago Controlled diabetes mellitus with nephropathy [facility])   [facility] Birdie Sons, MD                  NAMZARIC 28-10 MG CP24 [Pharmacy Med Name: Namzaric 28-10 MG Oral Capsule Extended Release 24 Hour] 30 capsule 0    Sig: Take 1 capsule by mouth once daily      Neurology:  Alzheimer's Agents Failed - 01/15/2021 11:09 AM      Failed - Valid encounter within last 6 months    Recent Outpatient Visits           8  months ago Other fatigue   [facility] Birdie Sons, MD   10 months ago Hemorrhoids, unspecified hemorrhoid type   [place], [place], PA-C   12 months ago Yeast infection of the skin   [facility], Kelby Aline, FNP   1 year ago Controlled diabetes mellitus with nephropathy [facility])   [facility] Birdie Sons, MD   1 year ago Controlled diabetes mellitus with nephropathy [facility])   [facility] Birdie Sons, MD                  metFORMIN (GLUCOPHAGE) 500 MG tablet [Pharmacy Med Name: metFORMIN HCl 500 MG Oral Tablet] 90 tablet 0    Sig: Take 1 tablet by mouth in the evening      Endocrinology:  Diabetes - Biguanides Failed - 01/15/2021 11:09 AM      Failed - Cr in normal range and within 360 days    Creat  Date Value Ref Range Status  10/18/2017 1.37 (H) 0.60 - 0.88 mg/dL Final    Comment:    For patients >61 years of age, the reference limit for Creatinine is approximately 13% higher for people identified as African-American. .    Creatinine, Ser  Date Value Ref Range Status  04/26/2020 1.41 (H) 0.57 - 1.00 mg/dL Final          Failed - HBA1C is between 0 and 7.9 and within 180 days    Hemoglobin A1C  Date Value Ref Range Status  01/10/2015 6.5  Final   Hgb A1c MFr Bld  Date Value Ref Range Status  04/26/2020 7.3 (H) 4.8 - 5.6 % Final    Comment:             Prediabetes: 5.7 - 6.4          Diabetes: >6.4          Glycemic control for adults with diabetes: <7.0           Failed - AA eGFR in normal range and within 360 days    GFR, Est African American  Date Value Ref Range Status  10/18/2017 42 (L) > OR = 60 mL/min/1.26m Final   GFR calc Af Amer  Date Value Ref Range Status  04/26/2020 39 (L) >59 mL/min/1.73 Final    Comment:    **[facility] currently reports eGFR in compliance with the current**   recommendations of the [employer] [facility]  will   update reporting as new guidelines are published from the NKF-ASN   Task force.    GFR, Est Non African American  Date Value Ref Range Status  10/18/2017 36 (L) > OR = 60 mL/min/1.724mFinal   GFR calc non Af Amer  Date Value Ref Range Status  04/26/2020 34 (L) >59 mL/min/1.73 Final          Failed - Valid encounter within last 6 months    Recent Outpatient Visits           8 months ago Other fatigue   [facility]   10 months ago Hemorrhoids, unspecified hemorrhoid type   [facility] 12 months ago Yeast infection of the skin   [facility]   1 year ago [facility]   1 year ago Cont[facility] diabetes mellitus with nephropathy ([facility]  [facility]

## 2021-01-15 NOTE — Telephone Encounter (Signed)
Please advise on refill request. Patient's last office visit was 04/26/2020. When does patient need to come back in the office for a follow up appointment?

## 2021-01-16 NOTE — Telephone Encounter (Signed)
Please advise needs to schedule office visit before refill can be aproved

## 2021-01-21 ENCOUNTER — Telehealth: Payer: Self-pay

## 2021-01-21 DIAGNOSIS — F0280 Dementia in other diseases classified elsewhere without behavioral disturbance: Secondary | ICD-10-CM

## 2021-01-21 DIAGNOSIS — R5381 Other malaise: Secondary | ICD-10-CM

## 2021-01-21 NOTE — Telephone Encounter (Signed)
Copied from [place] [id number]. Topic: Referral - Request for Referral >> Jan 21, 2021  1:24 PM Yvette Rack wrote: Has patient seen PCP for this complaint? yes *If NO, is insurance requiring patient see PCP for this issue before PCP can refer them? Referral for which specialty: hospice Preferred provider/office: no specific provider Reason for referral: Per pt daughter [name] pt health declining

## 2021-01-30 NOTE — Progress Notes (Signed)
Established patient visit   Patient: Briana Austin   DOB: 07/11/1934   87 y.o. Female  MRN: [id number] Visit Date: 01/31/2021  Today's healthcare provider: Lelon Huh, MD   Chief Complaint  Patient presents with  . Diabetes  . Hypertension   Subjective    HPI  Diabetes Mellitus Type II, Follow-up  Lab Results  Component Value Date   HGBA1C 7.6 (A) 01/31/2021   HGBA1C 7.3 (H) 04/26/2020   HGBA1C 7.6 (A) 01/01/2020   Wt Readings from Last 3 Encounters:  01/31/21 177 lb (80.3 kg)  04/26/20 183 lb (83 kg)  03/13/20 179 lb 6.4 oz (81.4 kg)   Last seen for diabetes 9 months ago.  Management since then includes continue same medications. She reports excellent compliance with treatment. She is not having side effects.  Symptoms: Yes fatigue No foot ulcerations  Yes appetite changes No nausea  No paresthesia of the feet  No polydipsia  No polyuria No visual disturbances   No vomiting     Home blood sugar records: 108-140 while fasting  Episodes of hypoglycemia? No    Current insulin regiment: none Most Recent Eye Exam: 09/11/2020 Current exercise: none Current diet habits: appetite has decreased lately but she eats a general diet.  Pertinent Labs: Lab Results  Component Value Date   CHOL 173 04/26/2020   HDL 45 04/26/2020   LDLCALC 104 (H) 04/26/2020   TRIG 137 04/26/2020   CHOLHDL 3.8 04/26/2020   Lab Results  Component Value Date   NA 136 04/26/2020   K 4.4 04/26/2020   CREATININE 1.41 (H) 04/26/2020   GFRNONAA 34 (L) 04/26/2020   GFRAA 39 (L) 04/26/2020   GLUCOSE 147 (H) 04/26/2020     ---------------------------------------------------------------------------------------------------  Hypertension, follow-up  BP Readings from Last 3 Encounters:  01/31/21 (!) 121/57  04/26/20 118/64  03/13/20 (!) 154/90   Wt Readings from Last 3 Encounters:  01/31/21 177 lb (80.3 kg)  04/26/20 183 lb (83 kg)  03/13/20 179 lb 6.4 oz (81.4 kg)      She was last seen for hypertension 9 months ago.  BP at that visit was 118/64. Management since that visit includes continue same medication.  She reports excellent compliance with treatment. She is not having side effects.  She is following a Regular diet. She is not exercising.  She does not smoke.  Use of agents associated with hypertension: NSAIDS.   Outside blood pressures are n/a. Symptoms:   No chest pain No chest pressure  No palpitations No syncope  No dyspnea No orthopnea  No paroxysmal nocturnal dyspnea No lower extremity edema   Pertinent labs: Lab Results  Component Value Date   CHOL 173 04/26/2020   HDL 45 04/26/2020   LDLCALC 104 (H) 04/26/2020   TRIG 137 04/26/2020   CHOLHDL 3.8 04/26/2020   Lab Results  Component Value Date   NA 136 04/26/2020   K 4.4 04/26/2020   CREATININE 1.41 (H) 04/26/2020   GFRNONAA 34 (L) 04/26/2020   GFRAA 39 (L) 04/26/2020   GLUCOSE 147 (H) 04/26/2020     The ASCVD Risk score Mikey Bussing DC Jr., et al., 2013) failed to calculate for the following reasons:   The 2013 ASCVD risk score is only valid for ages 21 to 69   ---------------------------------------------------------------------------------------------------      Medications: Outpatient Medications Prior to Visit  Medication Sig  . Accu-Chek Softclix Lancets lancets USE AS DIRECTED  . aspirin EC 81 MG tablet Take  81 mg by mouth daily.  . Blood Glucose Monitoring Suppl (ACCU-CHEK AVIVA PLUS) w/Device KIT AS DIRECTED  . calcium carbonate (OS-CAL) 600 MG TABS tablet Take 1 tablet by mouth daily.  Marland Kitchen glipiZIDE (GLUCOTROL XL) 5 MG 24 hr tablet Take 1 tablet by mouth once daily with breakfast  . glucose blood test strip USE TO CHECK GLUCOSE ONCE DAILY FOR TYPE 2 DIABETES E11.9  . levETIRAcetam (KEPPRA) 500 MG tablet Take 1 tablet by mouth twice daily  . lisinopril (ZESTRIL) 20 MG tablet Take 1 tablet by mouth once daily  . metFORMIN (GLUCOPHAGE) 500 MG tablet Take 1 tablet  by mouth in the evening  . montelukast (SINGULAIR) 10 MG tablet Take 1 tablet (10 mg total) by mouth daily.  Marland Kitchen NAMZARIC 28-10 MG CP24 Take 1 capsule by mouth once daily  . ONE TOUCH LANCETS MISC 1 Device by Does not apply route daily.  . potassium chloride (KLOR-CON) 10 MEQ tablet Take 1 tablet by mouth once daily  . pravastatin (PRAVACHOL) 40 MG tablet Take 1 tablet by mouth once daily  . vitamin B-12 1000 MCG tablet Take 1 tablet (1,000 mcg total) by mouth daily.  . Vitamin D, Ergocalciferol, (DRISDOL) 1.25 MG (50000 UT) CAPS capsule Take 1 capsule by mouth once a week   No facility-administered medications prior to visit.       Objective    BP (!) 121/57 (BP Location: Left Arm, Patient Position: Sitting, Cuff Size: Normal)   Pulse (!) 105   Ht 5' 5"  (1.651 m)   Wt 177 lb (80.3 kg)   SpO2 99%   BMI 29.45 kg/m     Physical Exam   General: Appearance:     Overweight female in no acute distress  Eyes:    PERRL, conjunctiva/corneas clear, EOM's intact       Lungs:     Clear to auscultation bilaterally, respirations unlabored  Heart:    Tachycardic. Normal rhythm. No murmurs, rubs, or gallops.   MS:   All extremities are intact.   Neurologic:   Awake, alert, oriented x 1. No apparent focal neurological           defect.         Results for orders placed or performed in visit on 01/31/21  POCT HgB A1C  Result Value Ref Range   Hemoglobin A1C 7.6 (A) 4.0 - 5.6 %   Estimated Average Glucose 171     Assessment & Plan     1. Controlled diabetes mellitus with nephropathy ([place])  - Comprehensive metabolic panel  2. Essential hypertension  - Comprehensive metabolic panel - TSH  3. Vitamin D deficiency  - VITAMIN D 25 Hydroxy (Vit-D Deficiency, Fractures)  4. Normocytic anemia  - CBC    Future Appointments  Date Time Provider [place]  04/03/2021  1:15 PM Gardiner Barefoot, DPM [facility]  08/14/2021  9:00 AM [facility]-NURSE HEALTH ADVISOR [facility]   08/15/2021  2:40 PM Jaramiah Bossard, Kirstie Peri, MD [facility]        The entirety of the information documented in the History of Present Illness, Review of Systems and Physical Exam were personally obtained by me. Portions of this information were initially documented by the CMA and reviewed by me for thoroughness and accuracy.      Lelon Huh, MD  [facility] [phone] (phone) [phone] (fax)  [place]

## 2021-01-31 ENCOUNTER — Ambulatory Visit (INDEPENDENT_AMBULATORY_CARE_PROVIDER_SITE_OTHER): Payer: Medicare HMO | Admitting: Family Medicine

## 2021-01-31 ENCOUNTER — Other Ambulatory Visit: Payer: Self-pay

## 2021-01-31 ENCOUNTER — Telehealth: Payer: Self-pay | Admitting: Nurse Practitioner

## 2021-01-31 VITALS — BP 128/62 | HR 105 | Ht 65.0 in | Wt 177.0 lb

## 2021-01-31 DIAGNOSIS — D649 Anemia, unspecified: Secondary | ICD-10-CM

## 2021-01-31 DIAGNOSIS — E559 Vitamin D deficiency, unspecified: Secondary | ICD-10-CM | POA: Diagnosis not present

## 2021-01-31 DIAGNOSIS — E1121 Type 2 diabetes mellitus with diabetic nephropathy: Secondary | ICD-10-CM | POA: Diagnosis not present

## 2021-01-31 DIAGNOSIS — I10 Essential (primary) hypertension: Secondary | ICD-10-CM | POA: Diagnosis not present

## 2021-01-31 LAB — POCT GLYCOSYLATED HEMOGLOBIN (HGB A1C)
Estimated Average Glucose: 171
Hemoglobin A1C: 7.6 % — AB (ref 4.0–5.6)

## 2021-01-31 NOTE — Telephone Encounter (Signed)
Spoke with patient's daughter, Briana Austin, regarding the Palliative referral and she has declined services at this time, she stated that her Mom has improved from what she was previously and that she would call us if they changed their mind in the future.  Will cancel the referral and notify Dr. Caryn Section and Palliative Team.

## 2021-02-01 LAB — COMPREHENSIVE METABOLIC PANEL
ALT: 24 IU/L (ref 0–32)
AST: 21 IU/L (ref 0–40)
Albumin/Globulin Ratio: 1.2 (ref 1.2–2.2)
Albumin: 4.3 g/dL (ref 3.6–4.6)
Alkaline Phosphatase: 67 IU/L (ref 44–121)
BUN/Creatinine Ratio: 17 (ref 12–28)
BUN: 23 mg/dL (ref 8–27)
Bilirubin Total: <0.2 mg/dL (ref 0.0–1.2)
CO2: 23 mmol/L (ref 20–29)
Calcium: 11.4 mg/dL — ABNORMAL HIGH (ref 8.7–10.3)
Chloride: 97 mmol/L (ref 96–106)
Creatinine, Ser: 1.36 mg/dL — ABNORMAL HIGH (ref 0.57–1.00)
Globulin, Total: 3.5 g/dL (ref 1.5–4.5)
Glucose: 228 mg/dL — ABNORMAL HIGH (ref 65–99)
Potassium: 4.6 mmol/L (ref 3.5–5.2)
Sodium: 137 mmol/L (ref 134–144)
Total Protein: 7.8 g/dL (ref 6.0–8.5)
eGFR: 38 mL/min/{1.73_m2} — ABNORMAL LOW (ref >59)

## 2021-02-01 LAB — VITAMIN D 25 HYDROXY (VIT D DEFICIENCY, FRACTURES): Vit D, 25-Hydroxy: 65.5 ng/mL (ref 30.0–100.0)

## 2021-02-01 LAB — CBC
Hematocrit: 33.1 % — ABNORMAL LOW (ref 34.0–46.6)
Hemoglobin: 11.2 g/dL (ref 11.1–15.9)
MCH: 31.5 pg (ref 26.6–33.0)
MCHC: 33.8 g/dL (ref 31.5–35.7)
MCV: 93 fL (ref 79–97)
Platelets: 251 10*3/uL (ref 150–450)
RBC: 3.56 x10E6/uL — ABNORMAL LOW (ref 3.77–5.28)
RDW: 12.6 % (ref 11.7–15.4)
WBC: 9.4 10*3/uL (ref 3.4–10.8)

## 2021-02-01 LAB — TSH: TSH: 1.17 u[IU]/mL (ref 0.450–4.500)

## 2021-02-10 ENCOUNTER — Other Ambulatory Visit: Payer: Self-pay | Admitting: Family Medicine

## 2021-02-12 ENCOUNTER — Other Ambulatory Visit: Payer: Self-pay | Admitting: Family Medicine

## 2021-02-26 ENCOUNTER — Other Ambulatory Visit: Payer: Self-pay | Admitting: Family Medicine

## 2021-03-12 ENCOUNTER — Other Ambulatory Visit: Payer: Self-pay | Admitting: Family Medicine

## 2021-03-12 DIAGNOSIS — I10 Essential (primary) hypertension: Secondary | ICD-10-CM

## 2021-04-03 ENCOUNTER — Encounter: Payer: Self-pay | Admitting: Podiatry

## 2021-04-03 ENCOUNTER — Other Ambulatory Visit: Payer: Self-pay

## 2021-04-03 ENCOUNTER — Ambulatory Visit: Payer: Medicare HMO | Admitting: Podiatry

## 2021-04-03 DIAGNOSIS — F0280 Dementia in other diseases classified elsewhere without behavioral disturbance: Secondary | ICD-10-CM

## 2021-04-03 DIAGNOSIS — B351 Tinea unguium: Secondary | ICD-10-CM

## 2021-04-03 DIAGNOSIS — M79675 Pain in left toe(s): Secondary | ICD-10-CM | POA: Diagnosis not present

## 2021-04-03 DIAGNOSIS — E1121 Type 2 diabetes mellitus with diabetic nephropathy: Secondary | ICD-10-CM

## 2021-04-03 DIAGNOSIS — G309 Alzheimer's disease, unspecified: Secondary | ICD-10-CM

## 2021-04-03 DIAGNOSIS — M79674 Pain in right toe(s): Secondary | ICD-10-CM

## 2021-04-03 NOTE — Progress Notes (Signed)
This patient presents  to my office for at risk foot care.  This patient requires this care by a professional since this patient will be at risk due to having diabetes mellitus.   This patient is unable to cut nails herself since the patient cannot reach her nails.These nails are painful walking and wearing shoes. She presents to the office with her daughter. This patient presents for at risk foot care today.  General Appearance  Alert, conversant and in no acute stress.  Vascular  Dorsalis pedis  are palpable  bilaterally. Posterior tibial pulses are absent  B/L. Capillary return is within normal limits  bilaterally. Cold feet  Bilaterally.  Absent digital hair.  Neurologic Deferred.. Muscle power within normal limits bilaterally.  Nails Thick disfigured discolored nails with subungual debris  Hallux  B/L. No evidence of bacterial infection or drainage bilaterally.  Orthopedic  No limitations of motion  feet .  No crepitus or effusions noted.  No bony pathology or digital deformities noted.  Skin  normotropic skin with no porokeratosis noted bilaterally.  No signs of infections or ulcers noted.     Onychomycosis  Pain in right toes  Pain in left toes  Consent was obtained for treatment procedures.   Mechanical debridement of nails  bilaterally performed with a nail nipper.  Filed with dremel without incident.    Return office visit    4 months                  Told patient to return for periodic foot care and evaluation due to potential at risk complications.   Gardiner Barefoot DPM

## 2021-04-16 ENCOUNTER — Other Ambulatory Visit: Payer: Self-pay

## 2021-04-16 ENCOUNTER — Encounter: Payer: Self-pay | Admitting: Emergency Medicine

## 2021-04-16 ENCOUNTER — Emergency Department
Admission: EM | Admit: 2021-04-16 | Discharge: 2021-04-16 | Disposition: A | Discharge status: Home / Self Care | Payer: Medicare HMO | Attending: Emergency Medicine | Admitting: Emergency Medicine

## 2021-04-16 ENCOUNTER — Emergency Department: Payer: Medicare HMO

## 2021-04-16 ENCOUNTER — Other Ambulatory Visit: Payer: Self-pay | Admitting: Family Medicine

## 2021-04-16 DIAGNOSIS — Z7984 Long term (current) use of oral hypoglycemic drugs: Secondary | ICD-10-CM | POA: Diagnosis not present

## 2021-04-16 DIAGNOSIS — R404 Transient alteration of awareness: Secondary | ICD-10-CM | POA: Diagnosis not present

## 2021-04-16 DIAGNOSIS — M25559 Pain in unspecified hip: Secondary | ICD-10-CM | POA: Diagnosis not present

## 2021-04-16 DIAGNOSIS — Z853 Personal history of malignant neoplasm of breast: Secondary | ICD-10-CM | POA: Insufficient documentation

## 2021-04-16 DIAGNOSIS — R531 Weakness: Secondary | ICD-10-CM | POA: Diagnosis not present

## 2021-04-16 DIAGNOSIS — E1121 Type 2 diabetes mellitus with diabetic nephropathy: Secondary | ICD-10-CM

## 2021-04-16 DIAGNOSIS — E119 Type 2 diabetes mellitus without complications: Secondary | ICD-10-CM | POA: Diagnosis not present

## 2021-04-16 DIAGNOSIS — W19XXXA Unspecified fall, initial encounter: Secondary | ICD-10-CM | POA: Diagnosis not present

## 2021-04-16 DIAGNOSIS — F0280 Dementia in other diseases classified elsewhere without behavioral disturbance: Secondary | ICD-10-CM | POA: Insufficient documentation

## 2021-04-16 DIAGNOSIS — F0390 Unspecified dementia without behavioral disturbance: Secondary | ICD-10-CM | POA: Diagnosis not present

## 2021-04-16 DIAGNOSIS — E860 Dehydration: Secondary | ICD-10-CM | POA: Diagnosis not present

## 2021-04-16 DIAGNOSIS — I10 Essential (primary) hypertension: Secondary | ICD-10-CM | POA: Diagnosis not present

## 2021-04-16 DIAGNOSIS — I700 Atherosclerosis of aorta: Secondary | ICD-10-CM | POA: Diagnosis not present

## 2021-04-16 DIAGNOSIS — R4182 Altered mental status, unspecified: Secondary | ICD-10-CM | POA: Diagnosis not present

## 2021-04-16 DIAGNOSIS — Z7982 Long term (current) use of aspirin: Secondary | ICD-10-CM | POA: Insufficient documentation

## 2021-04-16 DIAGNOSIS — Z79899 Other long term (current) drug therapy: Secondary | ICD-10-CM | POA: Diagnosis not present

## 2021-04-16 DIAGNOSIS — Z8673 Personal history of transient ischemic attack (TIA), and cerebral infarction without residual deficits: Secondary | ICD-10-CM | POA: Insufficient documentation

## 2021-04-16 DIAGNOSIS — G309 Alzheimer's disease, unspecified: Secondary | ICD-10-CM | POA: Diagnosis not present

## 2021-04-16 DIAGNOSIS — R102 Pelvic and perineal pain: Secondary | ICD-10-CM | POA: Diagnosis not present

## 2021-04-16 DIAGNOSIS — Y92002 Bathroom of unspecified non-institutional (private) residence single-family (private) house as the place of occurrence of the external cause: Secondary | ICD-10-CM | POA: Diagnosis not present

## 2021-04-16 DIAGNOSIS — Z87891 Personal history of nicotine dependence: Secondary | ICD-10-CM | POA: Diagnosis not present

## 2021-04-16 DIAGNOSIS — R410 Disorientation, unspecified: Secondary | ICD-10-CM | POA: Diagnosis not present

## 2021-04-16 DIAGNOSIS — Z043 Encounter for examination and observation following other accident: Secondary | ICD-10-CM | POA: Diagnosis not present

## 2021-04-16 DIAGNOSIS — M47816 Spondylosis without myelopathy or radiculopathy, lumbar region: Secondary | ICD-10-CM | POA: Diagnosis not present

## 2021-04-16 LAB — COMPREHENSIVE METABOLIC PANEL
ALT: 21 U/L (ref 0–44)
AST: 24 U/L (ref 15–41)
Albumin: 3.8 g/dL (ref 3.5–5.0)
Alkaline Phosphatase: 62 U/L (ref 38–126)
Anion gap: 9 (ref 5–15)
BUN: 24 mg/dL — ABNORMAL HIGH (ref 8–23)
CO2: 23 mmol/L (ref 22–32)
Calcium: 10.4 mg/dL — ABNORMAL HIGH (ref 8.9–10.3)
Chloride: 104 mmol/L (ref 98–111)
Creatinine, Ser: 1.43 mg/dL — ABNORMAL HIGH (ref 0.44–1.00)
GFR, Estimated: 36 mL/min — ABNORMAL LOW (ref >60)
Glucose, Bld: 202 mg/dL — ABNORMAL HIGH (ref 70–99)
Potassium: 4.1 mmol/L (ref 3.5–5.1)
Sodium: 136 mmol/L (ref 135–145)
Total Bilirubin: 0.5 mg/dL (ref 0.3–1.2)
Total Protein: 8 g/dL (ref 6.5–8.1)

## 2021-04-16 LAB — CBC WITH DIFFERENTIAL/PLATELET
Abs Immature Granulocytes: 0.03 10*3/uL (ref 0.00–0.07)
Basophils Absolute: 0.1 10*3/uL (ref 0.0–0.1)
Basophils Relative: 1 %
Eosinophils Absolute: 0.3 10*3/uL (ref 0.0–0.5)
Eosinophils Relative: 3 %
HCT: 32.5 % — ABNORMAL LOW (ref 36.0–46.0)
Hemoglobin: 10.8 g/dL — ABNORMAL LOW (ref 12.0–15.0)
Immature Granulocytes: 0 %
Lymphocytes Relative: 29 %
Lymphs Abs: 2.8 10*3/uL (ref 0.7–4.0)
MCH: 31.9 pg (ref 26.0–34.0)
MCHC: 33.2 g/dL (ref 30.0–36.0)
MCV: 95.9 fL (ref 80.0–100.0)
Monocytes Absolute: 0.7 10*3/uL (ref 0.1–1.0)
Monocytes Relative: 8 %
Neutro Abs: 5.7 10*3/uL (ref 1.7–7.7)
Neutrophils Relative %: 59 %
Platelets: 247 10*3/uL (ref 150–400)
RBC: 3.39 MIL/uL — ABNORMAL LOW (ref 3.87–5.11)
RDW: 14.6 % (ref 11.5–15.5)
WBC: 9.6 10*3/uL (ref 4.0–10.5)
nRBC: 0 % (ref 0.0–0.2)

## 2021-04-16 LAB — URINALYSIS, COMPLETE (UACMP) WITH MICROSCOPIC
Bilirubin Urine: NEGATIVE
Glucose, UA: NEGATIVE mg/dL
Hgb urine dipstick: NEGATIVE
Ketones, ur: NEGATIVE mg/dL
Nitrite: NEGATIVE
Protein, ur: 30 mg/dL — AB
Specific Gravity, Urine: 1.019 (ref 1.005–1.030)
pH: 5 (ref 5.0–8.0)

## 2021-04-16 LAB — TROPONIN I (HIGH SENSITIVITY): Troponin I (High Sensitivity): 12 ng/L (ref <18)

## 2021-04-16 MED ORDER — LACTATED RINGERS IV BOLUS
1000.0000 mL | Freq: Once | INTRAVENOUS | Status: AC
  Administered 2021-04-16: 1000 mL via INTRAVENOUS

## 2021-04-16 NOTE — ED Notes (Signed)
ED Provider at bedside. 

## 2021-04-16 NOTE — ED Provider Notes (Signed)
[facility] Emergency Department Provider Note  ____________________________________________  Time seen: Approximately 9:54 AM  I have reviewed the triage vital signs and the nursing notes.   HISTORY  Chief Complaint Fall    Level 5 Caveat: Portions of the History and Physical including HPI and review of systems are unable to be completely obtained due to patient altered mental status and advanced dementia  HPI Briana Austin is a 87 y.o. female with a history of diabetes, hypertension, seizures, dementia who was brought to the ED due to a fall this morning at about 5:00 AM in the bathroom.  Family has noted that the patient has been making frequent visits to the bathroom lately and seems more confused than usual.  Her oral intake is minimal with only scant eating and about 12 ounces of water intake daily.  Patient is not able to reliably report any symptoms or pain.  Additional history obtained from daughter at bedside who denies fever vomiting or diarrhea.  No sick contacts.    Past Medical History:  Diagnosis Date   Breast cancer ([place]) 2005   positive, radiation   CAP (community acquired pneumonia) 06/14/2015   Dementia ([place])    Diabetes mellitus without complication ([place])    History of breast cancer    Hyperlipidemia    Hypertension    Personal history of radiation therapy    Seizure [facility])      Patient Active Problem List   Diagnosis Date Noted   Pain due to onychomycosis of toenails of both feet 05/13/2020   Urinary symptom or sign 02/15/2020   Skin irritation 01/22/2020   Vaginal irritation 01/22/2020   Yeast infection of the skin 01/22/2020   Seizures ([place])    Euthyroid sick syndrome    Postictal paralysis ([place])    Dementia in Alzheimer's disease ([place])    Essential hypertension    Facial droop 07/03/2015   TIA (transient ischemic attack)    Controlled diabetes mellitus with nephropathy ([place]) 03/25/2015   Hyperlipidemia 03/25/2015    Normocytic anemia 03/25/2015   Physical deconditioning 03/25/2015   Vitamin D deficiency 11/17/2010     Past Surgical History:  Procedure Laterality Date   ABDOMINAL HYSTERECTOMY     BREAST BIOPSY Right    negative 2011   BREAST EXCISIONAL BIOPSY Right 2005   positive    BREAST LUMPECTOMY Right 2005   Carcinoma in situ of right breast   sigmoid polyp excision  11/24/2004   Hyperplastic polyp   TOTAL ABDOMINAL HYSTERECTOMY W/ BILATERAL SALPINGOOPHORECTOMY  1981   Benign     Prior to Admission medications   Medication Sig Start Date End Date Taking? Authorizing Provider  Accu-Chek Softclix Lancets lancets USE AS DIRECTED 09/05/20   Birdie Sons, MD  aspirin EC 81 MG tablet Take 81 mg by mouth daily.    [provider]  Blood Glucose Monitoring Suppl (ACCU-CHEK AVIVA PLUS) w/Device KIT AS DIRECTED 10/29/17   Birdie Sons, MD  calcium carbonate (OS-CAL) 600 MG TABS tablet Take 1 tablet by mouth daily.    [provider]  glipiZIDE (GLUCOTROL XL) 5 MG 24 hr tablet Take 1 tablet by mouth once daily with breakfast 01/17/21   Birdie Sons, MD  glucose blood test strip USE TO CHECK SUGAR ONCE DAILY 02/10/21   Birdie Sons, MD  levETIRAcetam (KEPPRA) 500 MG tablet Take 1 tablet by mouth twice daily 10/18/20   Birdie Sons, MD  lisinopril (ZESTRIL) 20 MG tablet  Take 1 tablet by mouth once daily 03/12/21   Birdie Sons, MD  metFORMIN (GLUCOPHAGE) 500 MG tablet Take 1 tablet by mouth in the evening 01/17/21   Birdie Sons, MD  montelukast (SINGULAIR) 10 MG tablet Take 1 tablet (10 mg total) by mouth daily. 01/01/20   Birdie Sons, MD  [facility] 28-10 MG CP24 Take 1 capsule by mouth once daily 03/12/21   Birdie Sons, MD  ONE TOUCH LANCETS MISC 1 Device by Does not apply route daily. 07/12/15   Birdie Sons, MD  potassium chloride (KLOR-CON) 10 MEQ tablet TAKE 1  BY MOUTH ONCE DAILY 02/26/21   Birdie Sons, MD  pravastatin (PRAVACHOL) 40 MG  tablet Take 1 tablet by mouth once daily 08/26/20   Birdie Sons, MD  vitamin B-12 1000 MCG tablet Take 1 tablet (1,000 mcg total) by mouth daily. 07/05/15   Shela Leff, MD  Vitamin D, Ergocalciferol, (DRISDOL) 1.25 MG (50000 UT) CAPS capsule Take 1 capsule by mouth once a week 04/12/19   Birdie Sons, MD     Allergies Patient has no known allergies.   Family History  Problem Relation Age of Onset   Stroke Mother    Dementia Brother    Breast cancer Neg Hx     Social History Social History   Tobacco Use   Smoking status: Former    Packs/day: 0.50    Years: 8.00    Pack years: 4.00    Types: Cigarettes   Smokeless tobacco: Never   Tobacco comments:    > 50 years ago  Vaping Use   Vaping Use: Never used  Substance Use Topics   Alcohol use: No    Alcohol/week: 0.0 standard drinks   Drug use: No    Review of Systems Level 5 Caveat: Portions of the History and Physical including HPI and review of systems are unable to be completely obtained due to patient being a poor historian   Constitutional:   No known fever.  ENT:   No rhinorrhea. Cardiovascular:   No chest pain or syncope. Respiratory:   No dyspnea or cough. Gastrointestinal:   Negative for abdominal pain, vomiting and diarrhea.  Musculoskeletal:   Negative for focal pain or swelling ____________________________________________   PHYSICAL EXAM:  VITAL SIGNS: ED Triage Vitals [04/16/21 0600]  Enc Vitals Group     BP 109/64     Pulse Rate 99     Resp 18     Temp 98.5 F (36.9 C)     Temp Source Oral     SpO2 95 %     Weight 178 lb (80.7 kg)     Height 5' 7"  (1.702 m)     Head Circumference      Peak Flow      Pain Score      Pain Loc      Pain Edu?      Excl. in [place]?     Vital signs reviewed, nursing assessments reviewed.   Constitutional: Awake, not alert, not oriented. Non-toxic appearance. Eyes:   Conjunctivae are normal. EOMI. PERRL. ENT      Head:   Normocephalic and  atraumatic.      Nose:   No congestion/rhinnorhea.       Mouth/Throat:   Dry mucous membranes, no pharyngeal erythema. No peritonsillar mass.       Neck:   No meningismus. Full ROM. Hematological/Lymphatic/Immunilogical:   No cervical lymphadenopathy. Cardiovascular:   RRR. Symmetric  bilateral radial and DP pulses.  No murmurs. Cap refill less than 2 seconds. Respiratory:   Normal respiratory effort without tachypnea/retractions. Breath sounds are clear and equal bilaterally. No wheezes/rales/rhonchi. Gastrointestinal:   Soft and nontender. Non distended. There is no CVA tenderness.  No rebound, rigidity, or guarding. Genitourinary:   deferred Musculoskeletal:   Normal range of motion in all extremities. No joint effusions.  No lower extremity tenderness.  No edema. Neurologic:   Normal speech, minimal language expression Motor grossly intact. No acute focal neurologic deficits are appreciated.  Skin:    Skin is warm, dry and intact. No rash noted.  No petechiae, purpura, or bullae.  ____________________________________________    LABS (pertinent positives/negatives) (all labs ordered are listed, but only abnormal results are displayed) Labs Reviewed  CBC WITH DIFFERENTIAL/PLATELET - Abnormal; Notable for the following components:      Result Value   RBC 3.39 (*)    Hemoglobin 10.8 (*)    HCT 32.5 (*)    All other components within normal limits  COMPREHENSIVE METABOLIC PANEL - Abnormal; Notable for the following components:   Glucose, Bld 202 (*)    BUN 24 (*)    Creatinine, Ser 1.43 (*)    Calcium 10.4 (*)    GFR, Estimated 36 (*)    All other components within normal limits  URINALYSIS, COMPLETE (UACMP) WITH MICROSCOPIC - Abnormal; Notable for the following components:   Color, Urine YELLOW (*)    APPearance HAZY (*)    Protein, ur 30 (*)    Leukocytes,Ua TRACE (*)    Bacteria, UA RARE (*)    All other components within normal limits  TROPONIN I (HIGH SENSITIVITY)    ____________________________________________   EKG  Interpreted by me Normal sinus rhythm rate of 96, normal axis.  Right bundle branch block.  LVH.  Associated repolarization abnormalities.  Normal ST segments, no ischemic changes.  ____________________________________________    RADIOLOGY  DG Pelvis Portable  Result Date: 04/16/2021 CLINICAL DATA:  Weakness and recent fall with pelvic pain, initial encounter EXAM: PORTABLE PELVIS 1-2 VIEWS COMPARISON:  None. FINDINGS: Pelvic ring appears intact. No fracture or dislocation is noted on this limited exam. Degenerative changes of the hip joints and lumbar spine are noted. IMPRESSION: No acute abnormality noted on this limited view of the pelvis. Electronically Signed   By: Inez Catalina M.D.   On: 04/16/2021 10:00   DG Chest Portable 1 View  Result Date: 04/16/2021 CLINICAL DATA:  Status post fall. EXAM: PORTABLE CHEST 1 VIEW COMPARISON:  February 04, 2018 FINDINGS: Cardiomediastinal silhouette is normal. Mediastinal contours appear intact. Calcific atherosclerotic disease and tortuosity of the aorta. There is no evidence of focal airspace consolidation, pleural effusion or pneumothorax. Osseous structures are without acute abnormality. Soft tissues are grossly normal. IMPRESSION: 1. No active disease. 2. Calcific atherosclerotic disease and tortuosity of the aorta. Electronically Signed   By: Fidela Salisbury M.D.   On: 04/16/2021 10:01    ____________________________________________   PROCEDURES Procedures  ____________________________________________  DIFFERENTIAL DIAGNOSIS   Dehydration, electrolyte abnormality, UTI, pneumonia, aspiration, pelvis fracture  CLINICAL IMPRESSION / ASSESSMENT AND PLAN / ED COURSE  Medications ordered in the ED: Medications  lactated ringers bolus 1,000 mL (1,000 mLs Intravenous New Bag/Given 04/16/21 1010)    Pertinent labs & imaging results that were available during my care of the patient  were reviewed by me and considered in my medical decision making (see chart for details).   Briana Austin was evaluated  in Emergency Department on 04/16/2021 for the symptoms described in the history of present illness. She was evaluated in the context of the global COVID-19 pandemic, which necessitated consideration that the patient might be at risk for infection with the SARS-CoV-2 virus that causes COVID-19. Institutional protocols and algorithms that pertain to the evaluation of patients at risk for COVID-19 are in a state of rapid change based on information released by regulatory bodies including the CDC and federal and state organizations. These policies and algorithms were followed during the patient's care in the ED.   Patient presents with fall this morning.  Advanced dementia limits information gathering, but exam is overall reassuring with normal vital signs and no focal findings other than some signs of dehydration.  Will give IV fluids.  Lab tests unremarkable.  Will obtain urinalysis, chest x-ray, pelvis x-ray.  No signs of head trauma.  No blood thinner use.  No neck tenderness or ROM limitation.  Will defer neuroimaging at this time.   ----------------------------------------- 11:10 AM on 04/16/2021 ----------------------------------------- Labs and x-rays unremarkable.  Given IV fluids, appears medically stable and at baseline.  Will discharge home for outpatient follow-up     ____________________________________________   FINAL CLINICAL IMPRESSION(S) / ED DIAGNOSES    Final diagnoses:  Fall, initial encounter  Dehydration  Chronic dementia without behavioral disturbance [facility])     ED Discharge Orders     None       Portions of this note were generated with dragon dictation software. Dictation errors may occur despite best attempts at proofreading.   Carrie Mew, MD 04/16/21 1110

## 2021-04-16 NOTE — ED Notes (Signed)
Radiology at bedside

## 2021-04-16 NOTE — ED Triage Notes (Signed)
EMS brings pt in from home; to triage via w/c with no distress noted; pt alert to self only; daughter st pt was found in the BR fall; fall few wks ago as well; no c/o injuries noted or reported; pt with hx dementia and wears depends; denies any recent illness or other c/o; st "she was just real talkative yesterday"

## 2021-04-16 NOTE — Discharge Instructions (Addendum)
Your lab tests and x-rays today are okay.  Please follow-up with your primary care doctor for continued evaluation.

## 2021-04-18 ENCOUNTER — Other Ambulatory Visit: Payer: Self-pay

## 2021-04-18 ENCOUNTER — Encounter: Payer: Self-pay | Admitting: Family Medicine

## 2021-04-18 ENCOUNTER — Ambulatory Visit (INDEPENDENT_AMBULATORY_CARE_PROVIDER_SITE_OTHER): Payer: Medicare HMO | Admitting: Family Medicine

## 2021-04-18 VITALS — BP 98/67 | HR 88 | Wt 175.0 lb

## 2021-04-18 DIAGNOSIS — I10 Essential (primary) hypertension: Secondary | ICD-10-CM

## 2021-04-18 DIAGNOSIS — G309 Alzheimer's disease, unspecified: Secondary | ICD-10-CM | POA: Diagnosis not present

## 2021-04-18 DIAGNOSIS — R296 Repeated falls: Secondary | ICD-10-CM

## 2021-04-18 DIAGNOSIS — F0280 Dementia in other diseases classified elsewhere without behavioral disturbance: Secondary | ICD-10-CM

## 2021-04-18 MED ORDER — LISINOPRIL 10 MG PO TABS: 10.0000 mg | ORAL_TABLET | Freq: Every day | ORAL | 3 refills | Status: DC

## 2021-04-18 NOTE — Progress Notes (Signed)
Established patient visit   Patient: Briana Austin   DOB: [birth year]   87 y.o. Female  MRN: [id number] Visit Date: 04/18/2021  Today's healthcare provider: Lelon Huh, MD   Chief Complaint  Patient presents with   Follow-up    ER follow up   Fall    Subjective    HPI HPI     Follow-up    Additional comments: ER follow up      Last edited by Kizzie Furnish, CMA on 04/18/2021 11:29 AM.      Follow up ER visit  Patient was seen in ER for a fall on 04/16/2021. She was treated for Dehydration. Treatment for this included . She reports excellent compliance with treatment. She reports this condition is Improved.  She apparently was able to overcome sides on bed and walked herself to the bathroom. Her husband heard a thump and found her on the floor in front of the sink. She had some minor bruises but no other significant injuries when at ER.  -----------------------------------------------------------------------------------------      Medications: Outpatient Medications Prior to Visit  Medication Sig   Accu-Chek Softclix Lancets lancets USE AS DIRECTED   aspirin EC 81 MG tablet Take 81 mg by mouth daily.   Blood Glucose Monitoring Suppl (ACCU-CHEK AVIVA PLUS) w/Device KIT AS DIRECTED   calcium carbonate (OS-CAL) 600 MG TABS tablet Take 1 tablet by mouth daily.   glipiZIDE (GLUCOTROL XL) 5 MG 24 hr tablet Take 1 tablet by mouth once daily with breakfast   glucose blood test strip USE TO CHECK SUGAR ONCE DAILY   levETIRAcetam (KEPPRA) 500 MG tablet Take 1 tablet by mouth twice daily   lisinopril (ZESTRIL) 20 MG tablet Take 1 tablet by mouth once daily   metFORMIN (GLUCOPHAGE) 500 MG tablet Take 1 tablet by mouth in the evening   montelukast (SINGULAIR) 10 MG tablet Take 1 tablet (10 mg total) by mouth daily.   NAMZARIC 28-10 MG CP24 Take 1 capsule by mouth once daily   ONE TOUCH LANCETS MISC 1 Device by Does not apply route daily.   potassium chloride  (KLOR-CON) 10 MEQ tablet TAKE 1  BY MOUTH ONCE DAILY   pravastatin (PRAVACHOL) 40 MG tablet Take 1 tablet by mouth once daily   vitamin B-12 1000 MCG tablet Take 1 tablet (1,000 mcg total) by mouth daily.   Vitamin D, Ergocalciferol, (DRISDOL) 1.25 MG (50000 UT) CAPS capsule Take 1 capsule by mouth once a week   No facility-administered medications prior to visit.    Review of Systems  Constitutional: Negative.   Gastrointestinal:  Positive for constipation. Negative for abdominal distention, abdominal pain, anal bleeding, blood in stool, diarrhea, nausea, rectal pain and vomiting.  Neurological:  Negative for dizziness, light-headedness and headaches.   1 `   Objective    BP 98/67 (BP Location: Left Arm, Patient Position: Sitting, Cuff Size: Large)   Pulse 88   Wt 175 lb (79.4 kg)   SpO2 100%   BMI 27.41 kg/m    Physical Exam   General: Appearance:     Well developed, well nourished female in no acute distress  Eyes:    PERRL, conjunctiva/corneas clear, EOM's intact       Lungs:     Clear to auscultation bilaterally, respirations unlabored  Heart:    Normal heart rate. Normal rhythm. No murmurs, rubs, or gallops.   MS:   All extremities are intact. No remarkable injuries noted.  Neurologic:   Awake, alert, oriented x 1. No apparent focal neurological           defect. Baseline mental status.        Assessment & Plan     1. Essential hypertension Is somewhat hypotensive today. Reduce from 34m to lisinopril (ZESTRIL) 10 MG tablet; Take 1 tablet (10 mg total) by mouth daily.  Dispense: 90 tablet; Refill: 3  2. Frequent falls  - [facility]ulatory [place] 3. Dementia in Alzheimer's disease ([facility] Discussed potential nursing placement with patient's husband and daughter, but the feel comfortable continuing to care for her at home and state they are usually able to stay with and care for her at all tiems.    Future Appointments  Date Time Provider [place] 08/07/2021  4:15 PM MGardiner Barefoot DPM [facility]  08/14/2021  9:00 AM [facility]FP-NURSE HEALTH ADVISOR [facility]  08/15/2021  2:40 PM Nickalous Stingley, DKirstie Peri MD [facility]        The entirety of the information documented in the History of Present Illness, Review of Systems and Physical Exam were personally obtained by me. Portions of this information were initially documented by the CMA and reviewed by me for thoroughness and accuracy.     DLelon Huh MD  [phone](phone) [phone](fax)  [place]

## 2021-04-22 DIAGNOSIS — G8384 Todd's paralysis (postepileptic): Secondary | ICD-10-CM | POA: Diagnosis not present

## 2021-04-22 DIAGNOSIS — K5900 Constipation, unspecified: Secondary | ICD-10-CM | POA: Diagnosis not present

## 2021-04-22 DIAGNOSIS — E0781 Sick-euthyroid syndrome: Secondary | ICD-10-CM | POA: Diagnosis not present

## 2021-04-22 DIAGNOSIS — G309 Alzheimer's disease, unspecified: Secondary | ICD-10-CM | POA: Diagnosis not present

## 2021-04-22 DIAGNOSIS — I10 Essential (primary) hypertension: Secondary | ICD-10-CM | POA: Diagnosis not present

## 2021-04-22 DIAGNOSIS — E559 Vitamin D deficiency, unspecified: Secondary | ICD-10-CM | POA: Diagnosis not present

## 2021-04-22 DIAGNOSIS — E1121 Type 2 diabetes mellitus with diabetic nephropathy: Secondary | ICD-10-CM | POA: Diagnosis not present

## 2021-04-22 DIAGNOSIS — F0280 Dementia in other diseases classified elsewhere without behavioral disturbance: Secondary | ICD-10-CM | POA: Diagnosis not present

## 2021-04-22 DIAGNOSIS — D500 Iron deficiency anemia secondary to blood loss (chronic): Secondary | ICD-10-CM | POA: Diagnosis not present

## 2021-04-24 ENCOUNTER — Telehealth: Payer: Self-pay

## 2021-04-24 DIAGNOSIS — F0280 Dementia in other diseases classified elsewhere without behavioral disturbance: Secondary | ICD-10-CM | POA: Diagnosis not present

## 2021-04-24 DIAGNOSIS — G309 Alzheimer's disease, unspecified: Secondary | ICD-10-CM | POA: Diagnosis not present

## 2021-04-24 DIAGNOSIS — E1121 Type 2 diabetes mellitus with diabetic nephropathy: Secondary | ICD-10-CM | POA: Diagnosis not present

## 2021-04-24 DIAGNOSIS — D500 Iron deficiency anemia secondary to blood loss (chronic): Secondary | ICD-10-CM | POA: Diagnosis not present

## 2021-04-24 DIAGNOSIS — E0781 Sick-euthyroid syndrome: Secondary | ICD-10-CM | POA: Diagnosis not present

## 2021-04-24 DIAGNOSIS — K5900 Constipation, unspecified: Secondary | ICD-10-CM | POA: Diagnosis not present

## 2021-04-24 DIAGNOSIS — I10 Essential (primary) hypertension: Secondary | ICD-10-CM | POA: Diagnosis not present

## 2021-04-24 DIAGNOSIS — G8384 Todd's paralysis (postepileptic): Secondary | ICD-10-CM | POA: Diagnosis not present

## 2021-04-24 DIAGNOSIS — E559 Vitamin D deficiency, unspecified: Secondary | ICD-10-CM | POA: Diagnosis not present

## 2021-04-24 NOTE — Telephone Encounter (Signed)
Left message advising [place].   Thanks,   -Mickel Baas

## 2021-04-24 NOTE — Telephone Encounter (Signed)
Copied from [place] [phone]. Topic: Quick Communication - Home Health Verbal Orders >> Apr 23, 2021  4:51 PM Loma [place] wrote: Caller/Agency: [facility] Number: [phone] Requesting /PT/ request Frequency: 2 wk 3 and 1 wk 4

## 2021-04-24 NOTE — Telephone Encounter (Signed)
That's fine

## 2021-05-02 DIAGNOSIS — K5900 Constipation, unspecified: Secondary | ICD-10-CM | POA: Diagnosis not present

## 2021-05-02 DIAGNOSIS — G309 Alzheimer's disease, unspecified: Secondary | ICD-10-CM | POA: Diagnosis not present

## 2021-05-02 DIAGNOSIS — I10 Essential (primary) hypertension: Secondary | ICD-10-CM | POA: Diagnosis not present

## 2021-05-02 DIAGNOSIS — F0280 Dementia in other diseases classified elsewhere without behavioral disturbance: Secondary | ICD-10-CM | POA: Diagnosis not present

## 2021-05-02 DIAGNOSIS — G8384 Todd's paralysis (postepileptic): Secondary | ICD-10-CM | POA: Diagnosis not present

## 2021-05-02 DIAGNOSIS — D500 Iron deficiency anemia secondary to blood loss (chronic): Secondary | ICD-10-CM | POA: Diagnosis not present

## 2021-05-02 DIAGNOSIS — E0781 Sick-euthyroid syndrome: Secondary | ICD-10-CM | POA: Diagnosis not present

## 2021-05-02 DIAGNOSIS — E559 Vitamin D deficiency, unspecified: Secondary | ICD-10-CM | POA: Diagnosis not present

## 2021-05-02 DIAGNOSIS — E1121 Type 2 diabetes mellitus with diabetic nephropathy: Secondary | ICD-10-CM | POA: Diagnosis not present

## 2021-05-06 ENCOUNTER — Other Ambulatory Visit: Payer: Self-pay | Admitting: Family Medicine

## 2021-05-06 DIAGNOSIS — Z7984 Long term (current) use of oral hypoglycemic drugs: Secondary | ICD-10-CM

## 2021-05-06 DIAGNOSIS — R569 Unspecified convulsions: Secondary | ICD-10-CM

## 2021-05-06 DIAGNOSIS — G309 Alzheimer's disease, unspecified: Secondary | ICD-10-CM | POA: Diagnosis not present

## 2021-05-06 DIAGNOSIS — F0280 Dementia in other diseases classified elsewhere without behavioral disturbance: Secondary | ICD-10-CM | POA: Diagnosis not present

## 2021-05-06 DIAGNOSIS — I10 Essential (primary) hypertension: Secondary | ICD-10-CM | POA: Diagnosis not present

## 2021-05-06 DIAGNOSIS — E119 Type 2 diabetes mellitus without complications: Secondary | ICD-10-CM

## 2021-05-06 DIAGNOSIS — E785 Hyperlipidemia, unspecified: Secondary | ICD-10-CM

## 2021-05-06 DIAGNOSIS — G8384 Todd's paralysis (postepileptic): Secondary | ICD-10-CM | POA: Diagnosis not present

## 2021-05-06 DIAGNOSIS — E0781 Sick-euthyroid syndrome: Secondary | ICD-10-CM | POA: Diagnosis not present

## 2021-05-06 DIAGNOSIS — K5900 Constipation, unspecified: Secondary | ICD-10-CM | POA: Diagnosis not present

## 2021-05-06 DIAGNOSIS — E1121 Type 2 diabetes mellitus with diabetic nephropathy: Secondary | ICD-10-CM | POA: Diagnosis not present

## 2021-05-06 DIAGNOSIS — D500 Iron deficiency anemia secondary to blood loss (chronic): Secondary | ICD-10-CM | POA: Diagnosis not present

## 2021-05-06 DIAGNOSIS — E559 Vitamin D deficiency, unspecified: Secondary | ICD-10-CM | POA: Diagnosis not present

## 2021-05-06 NOTE — Telephone Encounter (Signed)
   Notes to clinic:  script has expired  Review for refill   Requested Prescriptions  Pending Prescriptions Disp Refills   Accu-Chek Softclix Lancets lancets [Pharmacy Med Name: [place] 100 each 0    Sig: USE AS DIRECTED      Endocrinology: Diabetes - Testing Supplies Passed - 05/06/2021  9:34 AM      Passed - Valid encounter within last 12 months    Recent Outpatient Visits           2 weeks ago Frequent falls   [facility] Birdie Sons, MD   3 months ago Controlled diabetes mellitus with nephropathy [facility])   [facility] Birdie Sons, MD   1 year ago Other fatigue   [facility] Birdie Sons, MD   1 year ago Hemorrhoids, unspecified hemorrhoid type   [place], Wendee Beavers, PA-C   1 year ago Yeast infection of the skin   [employer] Flinchum, Kelby Aline, FNP       Future Appointments             In 3 months Fisher, Kirstie Peri, MD [facility] [place]

## 2021-05-09 DIAGNOSIS — G309 Alzheimer's disease, unspecified: Secondary | ICD-10-CM | POA: Diagnosis not present

## 2021-05-09 DIAGNOSIS — E1121 Type 2 diabetes mellitus with diabetic nephropathy: Secondary | ICD-10-CM | POA: Diagnosis not present

## 2021-05-09 DIAGNOSIS — D500 Iron deficiency anemia secondary to blood loss (chronic): Secondary | ICD-10-CM | POA: Diagnosis not present

## 2021-05-09 DIAGNOSIS — F0280 Dementia in other diseases classified elsewhere without behavioral disturbance: Secondary | ICD-10-CM | POA: Diagnosis not present

## 2021-05-09 DIAGNOSIS — E559 Vitamin D deficiency, unspecified: Secondary | ICD-10-CM | POA: Diagnosis not present

## 2021-05-09 DIAGNOSIS — K5900 Constipation, unspecified: Secondary | ICD-10-CM | POA: Diagnosis not present

## 2021-05-09 DIAGNOSIS — I10 Essential (primary) hypertension: Secondary | ICD-10-CM | POA: Diagnosis not present

## 2021-05-09 DIAGNOSIS — G8384 Todd's paralysis (postepileptic): Secondary | ICD-10-CM | POA: Diagnosis not present

## 2021-05-09 DIAGNOSIS — E0781 Sick-euthyroid syndrome: Secondary | ICD-10-CM | POA: Diagnosis not present

## 2021-05-10 DIAGNOSIS — I10 Essential (primary) hypertension: Secondary | ICD-10-CM | POA: Diagnosis not present

## 2021-05-10 DIAGNOSIS — D500 Iron deficiency anemia secondary to blood loss (chronic): Secondary | ICD-10-CM | POA: Diagnosis not present

## 2021-05-10 DIAGNOSIS — K5900 Constipation, unspecified: Secondary | ICD-10-CM | POA: Diagnosis not present

## 2021-05-10 DIAGNOSIS — G309 Alzheimer's disease, unspecified: Secondary | ICD-10-CM | POA: Diagnosis not present

## 2021-05-10 DIAGNOSIS — E559 Vitamin D deficiency, unspecified: Secondary | ICD-10-CM | POA: Diagnosis not present

## 2021-05-10 DIAGNOSIS — F0280 Dementia in other diseases classified elsewhere without behavioral disturbance: Secondary | ICD-10-CM | POA: Diagnosis not present

## 2021-05-10 DIAGNOSIS — G8384 Todd's paralysis (postepileptic): Secondary | ICD-10-CM | POA: Diagnosis not present

## 2021-05-10 DIAGNOSIS — E1121 Type 2 diabetes mellitus with diabetic nephropathy: Secondary | ICD-10-CM | POA: Diagnosis not present

## 2021-05-10 DIAGNOSIS — E0781 Sick-euthyroid syndrome: Secondary | ICD-10-CM | POA: Diagnosis not present

## 2021-05-14 DIAGNOSIS — I10 Essential (primary) hypertension: Secondary | ICD-10-CM | POA: Diagnosis not present

## 2021-05-14 DIAGNOSIS — F0280 Dementia in other diseases classified elsewhere without behavioral disturbance: Secondary | ICD-10-CM | POA: Diagnosis not present

## 2021-05-14 DIAGNOSIS — G8384 Todd's paralysis (postepileptic): Secondary | ICD-10-CM | POA: Diagnosis not present

## 2021-05-14 DIAGNOSIS — E1121 Type 2 diabetes mellitus with diabetic nephropathy: Secondary | ICD-10-CM | POA: Diagnosis not present

## 2021-05-14 DIAGNOSIS — G309 Alzheimer's disease, unspecified: Secondary | ICD-10-CM | POA: Diagnosis not present

## 2021-05-14 DIAGNOSIS — D500 Iron deficiency anemia secondary to blood loss (chronic): Secondary | ICD-10-CM | POA: Diagnosis not present

## 2021-05-14 DIAGNOSIS — E559 Vitamin D deficiency, unspecified: Secondary | ICD-10-CM | POA: Diagnosis not present

## 2021-05-14 DIAGNOSIS — K5900 Constipation, unspecified: Secondary | ICD-10-CM | POA: Diagnosis not present

## 2021-05-14 DIAGNOSIS — E0781 Sick-euthyroid syndrome: Secondary | ICD-10-CM | POA: Diagnosis not present

## 2021-05-19 ENCOUNTER — Telehealth: Payer: Self-pay

## 2021-05-19 NOTE — Telephone Encounter (Signed)
Copied from [place] [phone]. Topic: General - Other >> May 19, 2021 11:55 AM Yvette Rack wrote: Reason for CRM: Pt daughter [name] stated she would like a return call to discuss possibly getting a Rx for a sedative to help pt sleep. [name] stated pt is very agitated and not sleeping which does not allow them to sleep. [name] also would like to discuss pt taking a stool softener. Cb# [phone]

## 2021-05-19 NOTE — Telephone Encounter (Signed)
Patient's daughter [name] called back, waiting on response if patient can be prescribed sedative to calm her and help her sleep. Please call back [phone]

## 2021-05-20 MED ORDER — ALPRAZOLAM 0.5 MG PO TABS: 0.2500 mg | ORAL_TABLET | Freq: Every day | ORAL | 1 refills | Status: DC

## 2021-05-20 NOTE — Telephone Encounter (Signed)
Ok, I sent prescription for alprazolam to [facility] to take at night. She can 100mg  docusate (Colace) every night as a stool softener.

## 2021-05-20 NOTE — Telephone Encounter (Signed)
[name] advised.   Thanks,   -Mickel Baas

## 2021-05-21 ENCOUNTER — Other Ambulatory Visit: Payer: Self-pay | Admitting: Family Medicine

## 2021-05-28 ENCOUNTER — Telehealth: Payer: Self-pay

## 2021-05-28 DIAGNOSIS — F0280 Dementia in other diseases classified elsewhere without behavioral disturbance: Secondary | ICD-10-CM

## 2021-05-28 DIAGNOSIS — D500 Iron deficiency anemia secondary to blood loss (chronic): Secondary | ICD-10-CM | POA: Diagnosis not present

## 2021-05-28 DIAGNOSIS — E1121 Type 2 diabetes mellitus with diabetic nephropathy: Secondary | ICD-10-CM | POA: Diagnosis not present

## 2021-05-28 DIAGNOSIS — G309 Alzheimer's disease, unspecified: Secondary | ICD-10-CM | POA: Diagnosis not present

## 2021-05-28 DIAGNOSIS — E0781 Sick-euthyroid syndrome: Secondary | ICD-10-CM | POA: Diagnosis not present

## 2021-05-28 DIAGNOSIS — G8384 Todd's paralysis (postepileptic): Secondary | ICD-10-CM | POA: Diagnosis not present

## 2021-05-28 DIAGNOSIS — I10 Essential (primary) hypertension: Secondary | ICD-10-CM | POA: Diagnosis not present

## 2021-05-28 DIAGNOSIS — E559 Vitamin D deficiency, unspecified: Secondary | ICD-10-CM | POA: Diagnosis not present

## 2021-05-28 DIAGNOSIS — R5381 Other malaise: Secondary | ICD-10-CM

## 2021-05-28 DIAGNOSIS — K5900 Constipation, unspecified: Secondary | ICD-10-CM | POA: Diagnosis not present

## 2021-05-28 NOTE — Telephone Encounter (Signed)
Copied from [place] [phone]. Topic: Referral - Request for Referral >> May 28, 2021  9:11 AM Tessa Lerner A wrote: Has patient seen PCP for this complaint? Yes.    *If NO, is insurance requiring patient see PCP for this issue before PCP can refer them?  Referral for which specialty: Neurology  Preferred provider/office: Patient's daughter has no preference but would like for them to have ample geriatric knowledge   Reason for referral: Sleep concerns / Inability to sleep

## 2021-05-28 NOTE — Telephone Encounter (Signed)
There is a geriatrics clinic in [place] that is very helpful .would like to there?

## 2021-05-30 NOTE — Telephone Encounter (Signed)
Briana Austin was advised. Briana Austin said as long as clinic is covered by [employer], they would like to go there.

## 2021-06-03 NOTE — Telephone Encounter (Addendum)
patient daughter Briana Austin would like to know if the Geriatrics/nephrology referral is in network. Caller would like a follow up call as soon as possible.

## 2021-06-05 NOTE — Telephone Encounter (Signed)
[name] called in to inform Dr Caryn Section that she still need a referral for patient to a Neurologist because the medication is not working and she know that a Neurologist will be able to help patient with her current situation. Please call Per Ms Gilford Rile she have been waiting for a call back since Monday. Ph# [phone]

## 2021-06-06 ENCOUNTER — Other Ambulatory Visit: Payer: Self-pay | Admitting: Family Medicine

## 2021-06-06 NOTE — Telephone Encounter (Signed)
FYI. Any recommendations? Patient's daughter does not want to wait until Dec/Jan for an appt.

## 2021-06-06 NOTE — Telephone Encounter (Signed)
We could refer to Dr Manuella Ghazi at [facility] or to [facility] neurology which usually takes 3-6 week. We could try [facility], but that usually takes 4-5 months.

## 2021-06-06 NOTE — Telephone Encounter (Signed)
Patients daughter called in says its urgent for them to get a neurologist, She doesn't want to go with [facility], if paat isnt going to be until dec or January. Please call back

## 2021-06-09 NOTE — Telephone Encounter (Signed)
Spoke with Briana Austin this morning. She states that she would like a call back from the nurse. She would like to discuss a new medication. Please advise.

## 2021-06-11 DIAGNOSIS — K5900 Constipation, unspecified: Secondary | ICD-10-CM | POA: Diagnosis not present

## 2021-06-11 DIAGNOSIS — E559 Vitamin D deficiency, unspecified: Secondary | ICD-10-CM | POA: Diagnosis not present

## 2021-06-11 DIAGNOSIS — E1121 Type 2 diabetes mellitus with diabetic nephropathy: Secondary | ICD-10-CM | POA: Diagnosis not present

## 2021-06-11 DIAGNOSIS — E0781 Sick-euthyroid syndrome: Secondary | ICD-10-CM | POA: Diagnosis not present

## 2021-06-11 DIAGNOSIS — I10 Essential (primary) hypertension: Secondary | ICD-10-CM | POA: Diagnosis not present

## 2021-06-11 DIAGNOSIS — G309 Alzheimer's disease, unspecified: Secondary | ICD-10-CM | POA: Diagnosis not present

## 2021-06-11 DIAGNOSIS — D500 Iron deficiency anemia secondary to blood loss (chronic): Secondary | ICD-10-CM | POA: Diagnosis not present

## 2021-06-11 DIAGNOSIS — F0280 Dementia in other diseases classified elsewhere without behavioral disturbance: Secondary | ICD-10-CM | POA: Diagnosis not present

## 2021-06-11 DIAGNOSIS — G8384 Todd's paralysis (postepileptic): Secondary | ICD-10-CM | POA: Diagnosis not present

## 2021-06-12 ENCOUNTER — Ambulatory Visit: Payer: Self-pay | Admitting: *Deleted

## 2021-06-12 ENCOUNTER — Institutional Professional Consult (permissible substitution): Payer: Medicare HMO | Admitting: Neurology

## 2021-06-12 DIAGNOSIS — Z20822 Contact with and (suspected) exposure to covid-19: Secondary | ICD-10-CM | POA: Diagnosis not present

## 2021-06-12 DIAGNOSIS — Z03818 Encounter for observation for suspected exposure to other biological agents ruled out: Secondary | ICD-10-CM | POA: Diagnosis not present

## 2021-06-12 NOTE — Telephone Encounter (Signed)
Pt's daughter [name] calling. States pt tested positive for covid today. States she and her father are also positive. Pt's exposure Mon or Tuesday. States difficult to note when pts symptoms began "Because she is always coughing." Denies fever,no SOB. O2 sat 95%, HR 70. States difficult to assess as mother with dementia. "She just says she's ok." No other apparent symptoms other that increased confusion. States has been more agitated for a week or 2. Had appt with neurologist today, had to cancel. Reports Xanax at HS is not working.   States interested in oral anti-viral med for pt. Assured daughter NT would route to practice for PCPs review and final disposition. Advised ED for worsening symptoms, SOB.  Verbalized understanding.  Please advise

## 2021-06-12 NOTE — Telephone Encounter (Signed)
  Reason for Disposition  [1] HIGH RISK for severe COVID complications (e.g., weak immune system, age > 29 years, obesity with BMI > 25, pregnant, chronic lung disease or other chronic medical condition) AND [2] COVID symptoms (e.g., cough, fever)  (Exceptions: Already seen by PCP and no new or worsening symptoms.)  Answer Assessment - Initial Assessment Questions 1. COVID-19 DIAGNOSIS: "Who made your COVID-19 diagnosis?" "Was it confirmed by a positive lab test or self-test?" If not diagnosed by a doctor (or NP/PA), ask "Are there lots of cases (community spread) where you live?" Note: See public health department website, if unsure.    [facility] 2. COVID-19 EXPOSURE: "Was there any known exposure to COVID before the symptoms began?" CDC Definition of close contact: within 6 feet (2 meters) for a total of 15 minutes or more over a 24-hour period.      Daughter, saw mom Molli Knock and Beatris Ship. 3. ONSET: "When did the COVID-19 symptoms start?"      Mon or Tuesday 4. WORST SYMPTOM: "What is your worst symptom?" (e.g., cough, fever, shortness of breath, muscle aches)     *No Answer* 5. COUGH: "Do you have a cough?" If Yes, ask: "How bad is the cough?"       Yes but nothing new 6. FEVER: "Do you have a fever?" If Yes, ask: "What is your temperature, how was it measured, and when did it start?"     no 7. RESPIRATORY STATUS: "Describe your breathing?" (e.g., shortness of breath, wheezing, unable to speak)      SOB 8. BETTER-SAME-WORSE: "Are you getting better, staying the same or getting worse compared to yesterday?"  If getting worse, ask, "In what way?"    "Hard to tell" more  confused 9. HIGH RISK DISEASE: "Do you have any chronic medical problems?" (e.g., asthma, heart or lung disease, weak immune system, obesity, etc.)      10. VACCINE: "Have you had the COVID-19 vaccine?" If Yes, ask: "Which one, how many shots, when did you get it?"       X2 pfizer 11. BOOSTER: "Have you received your COVID-19  booster?" If Yes, ask: "Which one and when did you get it?"       1 booster, pfizer, 13. OTHER SYMPTOMS: "Do you have any other symptoms?"  (e.g., chills, fatigue, headache, loss of smell or taste, muscle pain, sore throat)       No 14. O2 SATURATION MONITOR:  "Do you use an oxygen saturation monitor (pulse oximeter) at home?" If Yes, ask "What is your reading (oxygen level) today?" "What is your usual oxygen saturation reading?" (e.g., 95%)       95%  HR  70  Protocols used: Coronavirus (COVID-19) Diagnosed or Suspected-A-AH

## 2021-06-13 ENCOUNTER — Telehealth: Payer: Self-pay | Admitting: Family Medicine

## 2021-06-13 ENCOUNTER — Ambulatory Visit: Payer: Self-pay

## 2021-06-13 MED ORDER — NIRMATRELVIR/RITONAVIR (PAXLOVID) TABLET (RENAL DOSING): 2.0000 | ORAL_TABLET | Freq: Two times a day (BID) | ORAL | 0 refills | Status: DC

## 2021-06-13 NOTE — Telephone Encounter (Signed)
Have sent prescription for paxlovid to [facility]

## 2021-06-13 NOTE — Addendum Note (Signed)
Addended by: Lelon Huh E on: 06/13/2021 10:15 AM   Modules accepted: Orders

## 2021-06-13 NOTE — Telephone Encounter (Signed)
Patient's daughter called and says her mom doesn't swallow her pills, she chews them all up like candy and swallows. She says the Paxlovid that Dr. Caryn Section ordered she red not to break, crush or chew to swallow whole. She says she has not picked up the Rx yet and wants to know another alternative, is it available in liquid form, what does she need to do. I advised I will send this to Dr. Caryn Section and someone will call back with his recommendation. Patient's daughter verbalized understanding.  Message from Celene Kras sent at 06/13/2021 12:40 PM EDT  Pts daughter calling in on behalf of pt. She states that the paxlovid was sent over for pt, but pt is not able to swallow whole pills and will need to chew or crush against instructions. Please advise.     Reason for Disposition  [1] Caller has URGENT medicine question about med that PCP or specialist prescribed AND [2] triager unable to answer question  Answer Assessment - Initial Assessment Questions 1. NAME of MEDICATION: "What medicine are you calling about?"     Paxlovid 2. QUESTION: "What is your question?" (e.g., double dose of medicine, side effect)     Mom will not be able to swallow the pill 3. PRESCRIBING HCP: "Who prescribed it?" Reason: if prescribed by specialist, call should be referred to that group.     Dr. Caryn Section 4. SYMPTOMS: "Do you have any symptoms?"     No 5. SEVERITY: If symptoms are present, ask "Are they mild, moderate or severe?"     N/A 6. PREGNANCY:  "Is there any chance that you are pregnant?" "When was your last menstrual period?"     No  Protocols used: Medication Question Call-A-AH

## 2021-06-13 NOTE — Telephone Encounter (Signed)
PEC agent transferred call to [facility] triage nurse. Nurse note routed to provider for review.

## 2021-06-13 NOTE — Telephone Encounter (Signed)
Pt's daughter called and reported that the patient cannot swallow pills so they are requesting an alternative option for treatement. She is requesting to start remdisivir.   Best contact: [phone]

## 2021-06-13 NOTE — Telephone Encounter (Signed)
Please review. Thanks!  

## 2021-06-13 NOTE — Telephone Encounter (Signed)
There is no liquid form and there is no oral medication for Covid that can be crushed. We could refer her for antibody infusion in [place] if they like. Will probably early next week before they can schedule her.

## 2021-06-13 NOTE — Telephone Encounter (Signed)
I called and spoke with patients daughter [name]. She advised me that the Neurology appointment has been taken care of and was scheduled for yesterday, but it had to be cancelled due to patient testing positive for COVID yesterday via [facility]. Patient mostly has a bad cough and walks slower than usual. Symptoms started 2 days ago (06/11/2021). [name] is requesting a prescription for an antiviral. Please advise.

## 2021-06-13 NOTE — Telephone Encounter (Signed)
No, we only have privileges to order at [facility].

## 2021-06-16 ENCOUNTER — Telehealth: Payer: Self-pay

## 2021-06-16 ENCOUNTER — Ambulatory Visit (INDEPENDENT_AMBULATORY_CARE_PROVIDER_SITE_OTHER): Payer: Medicare HMO | Admitting: *Deleted

## 2021-06-16 DIAGNOSIS — U071 COVID-19: Secondary | ICD-10-CM | POA: Diagnosis not present

## 2021-06-16 MED ORDER — DIPHENHYDRAMINE HCL 50 MG/ML IJ SOLN: 50.0000 mg | Freq: Once | INTRAMUSCULAR | Status: AC | PRN

## 2021-06-16 MED ORDER — ALBUTEROL SULFATE HFA 108 (90 BASE) MCG/ACT IN AERS: 2.0000 | INHALATION_SPRAY | Freq: Once | RESPIRATORY_TRACT | Status: AC | PRN

## 2021-06-16 MED ORDER — METHYLPREDNISOLONE SODIUM SUCC 125 MG IJ SOLR
125.0000 mg | Freq: Once | INTRAMUSCULAR | Status: AC | PRN
Start: 2021-06-16 — End: 2021-06-16

## 2021-06-16 MED ORDER — FAMOTIDINE IN NACL 20-0.9 MG/50ML-% IV SOLN: 20.0000 mg | Freq: Once | INTRAVENOUS | Status: AC | PRN

## 2021-06-16 MED ORDER — EPINEPHRINE 0.3 MG/0.3ML IJ SOAJ: 0.3000 mg | Freq: Once | INTRAMUSCULAR | Status: AC | PRN

## 2021-06-16 MED ORDER — SODIUM CHLORIDE 0.9 % IV SOLN
INTRAVENOUS | Status: DC | PRN
Start: 2021-06-16 — End: 2021-08-15

## 2021-06-16 MED ORDER — BEBTELOVIMAB 175 MG/2 ML IV (EUA)
175.0000 mg | Freq: Once | INTRAMUSCULAR | Status: AC
Start: 2021-06-16 — End: 2021-06-16
  Administered 2021-06-16: 175 mg via INTRAVENOUS

## 2021-06-16 NOTE — Telephone Encounter (Signed)
Have sent orders to infusion center. They should call within 24 hours.

## 2021-06-16 NOTE — Telephone Encounter (Signed)
Have sent orders to infusion center. They should call her within 24 hours to schedule

## 2021-06-16 NOTE — Addendum Note (Signed)
Addended by: Birdie Sons on: 06/16/2021 09:59 AM   Modules accepted: Orders, SmartSet

## 2021-06-16 NOTE — Telephone Encounter (Signed)
Spoke with pt's daughter. She wanted to see if orders could be sent to [facility]. She was notified that they could only be sent to [facility]). She expressed understanding. Also notified her that the infusion center would be calling with all information for pt.

## 2021-06-16 NOTE — Patient Instructions (Signed)
10 Things You Can Do to Manage Your COVID-19 Symptoms at Home If you have possible or confirmed COVID-19 Stay home except to get medical care. Monitor your symptoms carefully. If your symptoms get worse, call your healthcare provider immediately. Get rest and stay hydrated. If you have a medical appointment, call the healthcare provider ahead of time and tell them that you have or may have COVID-19. For medical emergencies, call 911 and notify the dispatch personnel that you have or may have COVID-19. Cover your cough and sneezes with a tissue or use the inside of your elbow. Wash your hands often with soap and water for at least 20 seconds or clean your hands with an alcohol-based hand sanitizer that contains at least 60% alcohol. As much as possible, stay in a specific room and away from other people in your home. Also, you should use a separate bathroom, if available. If you need to be around other people in or outside of the home, wear a mask. Avoid sharing personal items with other people in your household, like dishes, towels, and bedding. Clean all surfaces that are touched often, like counters, tabletops, and doorknobs. Use household cleaning sprays or wipes according to the label instructions. cdc.gov/coronavirus What types of side effects do monoclonal antibody drugs cause?  Common side effects  In general, the more common side effects caused by monoclonal antibody drugs include: Allergic reactions, such as hives or itching Flu-like signs and symptoms, including chills, fatigue, fever, and muscle aches and pains Nausea, vomiting Diarrhea Skin rashes Low blood pressure   The CDC is recommending patients who receive monoclonal antibody treatments wait at least 90 days before being vaccinated.  Currently, there are no data on the safety and efficacy of mRNA COVID-19 vaccines in persons who received monoclonal antibodies or convalescent plasma as part of COVID-19 treatment. Based on  the estimated half-life of such therapies as well as evidence suggesting that reinfection is uncommon in the 90 days after initial infection, vaccination should be deferred for at least 90 days, as a precautionary measure until additional information becomes available, to avoid interference of the antibody treatment with vaccine-induced immune responses.  05/17/2020 This information is not intended to replace advice given to you by your health care provider. Make sure you discuss any questions you have with your healthcare provider. Document Revised: 12/06/2020 Document Reviewed: 12/06/2020 Elsevier Patient Education  2022 Elsevier Inc.  

## 2021-06-16 NOTE — Progress Notes (Signed)
Diagnosis: COVID  Provider:  Marshell Garfinkel, MD  Procedure: Infusion  IV Type: Peripheral, IV Location: R Forearm  Bebtelovimab, Dose: 175 mg  Infusion Start Time: 1442pm  Infusion Stop Time: 1443pm  Post Infusion IV Care: Observation period completed  Discharge: Condition: Good, Destination: Home . AVS provided to patient.   Performed by:  Oren Beckmann, RN

## 2021-06-16 NOTE — Telephone Encounter (Signed)
Spoke to patient's daughter.  See other encounter   Copied from [place] [id number]. Topic: General - Other >> Jun 16, 2021  8:44 AM Tessa Lerner A wrote: Reason for CRM: Patient's daughter would like to be contacted about their request for Remdesivir   The patient's daughter shares that they spoke with a member of staff on Friday and was told that the urgency of their request would be conveyed  The patient's daughter would like to speak with a member of clinical staff about this request further when possible   Please contact when available

## 2021-07-02 DIAGNOSIS — E119 Type 2 diabetes mellitus without complications: Secondary | ICD-10-CM | POA: Diagnosis not present

## 2021-07-02 DIAGNOSIS — R350 Frequency of micturition: Secondary | ICD-10-CM | POA: Diagnosis not present

## 2021-07-02 DIAGNOSIS — I10 Essential (primary) hypertension: Secondary | ICD-10-CM | POA: Diagnosis not present

## 2021-07-02 DIAGNOSIS — F0390 Unspecified dementia without behavioral disturbance: Secondary | ICD-10-CM | POA: Diagnosis not present

## 2021-07-02 DIAGNOSIS — E559 Vitamin D deficiency, unspecified: Secondary | ICD-10-CM | POA: Diagnosis not present

## 2021-07-02 LAB — HEMOGLOBIN A1C: Hemoglobin A1C: 6.8

## 2021-07-02 LAB — MICROALBUMIN, URINE: Microalb, Ur: 6

## 2021-07-07 ENCOUNTER — Other Ambulatory Visit: Payer: Self-pay | Admitting: Family Medicine

## 2021-07-07 DIAGNOSIS — J301 Allergic rhinitis due to pollen: Secondary | ICD-10-CM

## 2021-07-09 ENCOUNTER — Other Ambulatory Visit: Payer: Self-pay | Admitting: Family Medicine

## 2021-07-09 DIAGNOSIS — E1121 Type 2 diabetes mellitus with diabetic nephropathy: Secondary | ICD-10-CM

## 2021-07-16 ENCOUNTER — Other Ambulatory Visit: Payer: Self-pay | Admitting: Family Medicine

## 2021-07-16 DIAGNOSIS — E119 Type 2 diabetes mellitus without complications: Secondary | ICD-10-CM

## 2021-08-04 DIAGNOSIS — F03B4 Unspecified dementia, moderate, with anxiety: Secondary | ICD-10-CM | POA: Diagnosis not present

## 2021-08-04 DIAGNOSIS — Z23 Encounter for immunization: Secondary | ICD-10-CM | POA: Diagnosis not present

## 2021-08-07 ENCOUNTER — Ambulatory Visit: Payer: Medicare HMO | Admitting: Podiatry

## 2021-08-11 ENCOUNTER — Other Ambulatory Visit: Payer: Self-pay | Admitting: Family Medicine

## 2021-08-11 NOTE — Telephone Encounter (Signed)
Requested medications are due for refill today.  unknown  Requested medications are on the active medications list.  yes  Last refill. 04/12/2019  Future visit scheduled.   yes  Notes to clinic.  Medication is not delegated. Medication was d/c'd 08/26/2020.

## 2021-08-15 ENCOUNTER — Other Ambulatory Visit: Payer: Self-pay

## 2021-08-15 ENCOUNTER — Ambulatory Visit (INDEPENDENT_AMBULATORY_CARE_PROVIDER_SITE_OTHER): Payer: Medicare HMO | Admitting: Family Medicine

## 2021-08-15 ENCOUNTER — Encounter: Payer: Self-pay | Admitting: Family Medicine

## 2021-08-15 VITALS — BP 112/69 | HR 88 | Temp 98.4°F | Resp 18 | Wt 166.0 lb

## 2021-08-15 DIAGNOSIS — G4700 Insomnia, unspecified: Secondary | ICD-10-CM | POA: Diagnosis not present

## 2021-08-15 DIAGNOSIS — E1121 Type 2 diabetes mellitus with diabetic nephropathy: Secondary | ICD-10-CM

## 2021-08-15 DIAGNOSIS — G309 Alzheimer's disease, unspecified: Secondary | ICD-10-CM

## 2021-08-15 DIAGNOSIS — F0280 Dementia in other diseases classified elsewhere without behavioral disturbance: Secondary | ICD-10-CM | POA: Diagnosis not present

## 2021-08-15 DIAGNOSIS — R569 Unspecified convulsions: Secondary | ICD-10-CM | POA: Diagnosis not present

## 2021-08-15 NOTE — Progress Notes (Signed)
    Established patient visit   Patient: Briana Austin   DOB: 06/08/1934   87 y.o. Female  MRN: [id number] Visit Date: 08/15/2021  Today's healthcare provider:  , MD   Chief Complaint  Patient presents with   Diabetes   Hypertension   Hyperlipidemia   Subjective    HPI  Diabetes Mellitus Type II, Follow-up  Lab Results  Component Value Date   HGBA1C 6.8 07/02/2021   HGBA1C 7.6 (A) 01/31/2021   HGBA1C 7.3 (H) 04/26/2020   Wt Readings from Last 3 Encounters:  08/15/21 166 lb (75.3 kg)  04/18/21 175 lb (79.4 kg)  04/16/21 178 lb (80.7 kg)   Last seen for diabetes 6 months ago. Since then she has been going to geriatric clinic at [facility] and had A1c checked as above.  Management since then includes continue same medications. She reports good compliance with treatment. She is not having side effects.  Symptoms: No fatigue No foot ulcerations  No appetite changes No nausea  No paresthesia of the feet  No polydipsia  No polyuria No visual disturbances   No vomiting     Home blood sugar records:  blood sugars are not checked  Episodes of hypoglycemia? No    Current insulin regiment: none Most Recent Eye Exam: 09/11/2020 Current exercise: none Current diet habits: on average, 1-2  meals per day with snacks  Pertinent Labs: Lab Results  Component Value Date   CHOL 173 04/26/2020   HDL 45 04/26/2020   LDLCALC 104 (H) 04/26/2020   TRIG 137 04/26/2020   CHOLHDL 3.8 04/26/2020   Lab Results  Component Value Date   NA 136 04/16/2021   K 4.1 04/16/2021   CREATININE 1.43 (H) 04/16/2021   EGFR 38 (L) 01/31/2021   GFRNONAA 36 (L) 04/16/2021   GLUCOSE 202 (H) 04/16/2021     ---------------------------------------------------------------------------------------------------   Hypertension, follow-up  BP Readings from Last 3 Encounters:  08/15/21 112/69  06/16/21 139/82  04/18/21 98/67   Wt Readings from Last 3 Encounters:  08/15/21 166 lb (75.3 kg)   04/18/21 175 lb (79.4 kg)  04/16/21 178 lb (80.7 kg)     She was last seen for hypertension 3 months ago.  BP at that visit was 98/67. Management since that visit includes reducing Lisinopril from 20mg to 10mg daily.  She reports good compliance with treatment. She is not having side effects.  She is following a Regular diet. She is not exercising. She does not smoke.  Use of agents associated with hypertension: NSAIDS.   Outside blood pressures are not checked. Symptoms: No chest pain No chest pressure  No palpitations No syncope  No dyspnea No orthopnea  No paroxysmal nocturnal dyspnea No lower extremity edema   Pertinent labs: Lab Results  Component Value Date   CHOL 173 04/26/2020   HDL 45 04/26/2020   LDLCALC 104 (H) 04/26/2020   TRIG 137 04/26/2020   CHOLHDL 3.8 04/26/2020   Lab Results  Component Value Date   NA 136 04/16/2021   K 4.1 04/16/2021   CREATININE 1.43 (H) 04/16/2021   EGFR 38 (L) 01/31/2021   GFRNONAA 36 (L) 04/16/2021   GLUCOSE 202 (H) 04/16/2021     The ASCVD Risk score (Arnett DK, et al., 2019) failed to calculate for the following reasons:   The 2019 ASCVD risk score is only valid for ages 40 to 79   ---------------------------------------------------------------------------------------------------     Medications: Outpatient Medications Prior to Visit  Medication Sig     sertraline (ZOLOFT) 50 MG tablet Take 25 mg by mouth daily.   Accu-Chek Softclix Lancets lancets USE AS DIRECTED   ALPRAZolam (XANAX) 0.5 MG tablet Take 0.5-1 tablets (0.25-0.5 mg total) by mouth at bedtime.   aspirin EC 81 MG tablet Take 81 mg by mouth daily.   Blood Glucose Monitoring Suppl (ACCU-CHEK AVIVA PLUS) w/Device KIT AS DIRECTED   calcium carbonate (OS-CAL) 600 MG TABS tablet Take 1 tablet by mouth daily.   Cholecalciferol (VITAMIN D3) 1.25 MG (50000 UT) CAPS Take 1 capsule by mouth once a week   glipiZIDE (GLUCOTROL XL) 5 MG 24 hr tablet Take 1 tablet by  mouth once daily with breakfast   glucose blood test strip USE TO CHECK SUGAR ONCE DAILY   levETIRAcetam (KEPPRA) 500 MG tablet Take 1 tablet by mouth twice daily   lisinopril (ZESTRIL) 10 MG tablet Take 1 tablet (10 mg total) by mouth daily.   metFORMIN (GLUCOPHAGE) 500 MG tablet Take 1 tablet by mouth in the evening   montelukast (SINGULAIR) 10 MG tablet Take 1 tablet by mouth once daily   NAMZARIC 28-10 MG CP24 Take 1 capsule by mouth once daily   ONE TOUCH LANCETS MISC 1 Device by Does not apply route daily.   potassium chloride (KLOR-CON) 10 MEQ tablet Take 1 tablet by mouth once daily   pravastatin (PRAVACHOL) 40 MG tablet Take 1 tablet by mouth once daily   traZODone (DESYREL) 50 MG tablet Take 75-100 mg by mouth at bedtime.   vitamin B-12 1000 MCG tablet Take 1 tablet (1,000 mcg total) by mouth daily.   Vitamin D, Ergocalciferol, (DRISDOL) 1.25 MG (50000 UT) CAPS capsule Take 1 capsule by mouth once a week   [DISCONTINUED] 0.9 %  sodium chloride infusion    No facility-administered medications prior to visit.    Review of Systems  Constitutional:  Positive for appetite change. Negative for chills, fatigue and fever.  Respiratory:  Negative for chest tightness and shortness of breath.   Cardiovascular:  Negative for chest pain and palpitations.  Gastrointestinal:  Negative for abdominal pain, nausea and vomiting.  Neurological:  Negative for dizziness and weakness.      Objective    BP 112/69 (BP Location: Left Arm, Patient Position: Sitting, Cuff Size: Normal)   Pulse 88   Temp 98.4 F (36.9 C) (Oral)   Resp 18   Wt 166 lb (75.3 kg)   SpO2 100% Comment: room air  BMI 26.00 kg/m  {Show previous vital signs (optional):23777}  Physical Exam   General: Appearance:     Well developed, well nourished female in no acute distress  Eyes:    PERRL, conjunctiva/corneas clear, EOM's intact       Lungs:     Clear to auscultation bilaterally, respirations unlabored  Heart:     Normal heart rate. Normal rhythm. No murmurs, rubs, or gallops.    MS:   All extremities are intact.    Neurologic:   Awake, alert, oriented x 1. No apparent focal neurological defect.        Assessment & Plan     1. Controlled diabetes mellitus with nephropathy ([place]) Well controlled.  Continue current medications.    2. Dementia in Alzheimer's disease ([place]) Continue namzeric.   3. Seizures ([place]) Controlled. Continue Keppra  4. Insomnia, unspecified type Was excessively somnolent on mirtazepine prescribed at Geriatric clinic, but doing better since changed to higher dose of trazodone and starting low dose of sertraline. Continue monthly follow up at  Geriatric clinic and follow up here in 6 months.   Future Appointments  Date Time Provider Department Center  09/11/2021  3:00 PM Mayer, Gregory, DPM [facility]  02/16/2022  3:20 PM ,  E, MD [facility]        The entirety of the information documented in the History of Present Illness, Review of Systems and Physical Exam were personally obtained by me. Portions of this information were initially documented by the CMA and reviewed by me for thoroughness and accuracy.      , MD  [facility] [phone] (phone) [phone] (fax)  [facility]  

## 2021-08-20 ENCOUNTER — Other Ambulatory Visit: Payer: Self-pay | Admitting: Family Medicine

## 2021-08-20 NOTE — Telephone Encounter (Signed)
Requested medication (s) are due for refill today: Yes  Requested medication (s) are on the active medication list: Yes  Last refill:  05/21/21  Future visit scheduled: Yes  Notes to clinic:  Protocol indicates pt. Needs lab work.    Requested Prescriptions  Pending Prescriptions Disp Refills   potassium chloride (KLOR-CON) 10 MEQ tablet [Pharmacy Med Name: Potassium Chloride Crys ER 10 MEQ Oral Tablet Extended Release] 90 tablet 0    Sig: Take 1 tablet by mouth once daily     Endocrinology:  Minerals - Potassium Supplementation Failed - 08/20/2021  8:15 AM      Failed - Cr in normal range and within 360 days    Creat  Date Value Ref Range Status  10/18/2017 1.37 (H) 0.60 - 0.88 mg/dL Final    Comment:    For patients >60 years of age, the reference limit for Creatinine is approximately 13% higher for people identified as African-American. .    Creatinine, Ser  Date Value Ref Range Status  04/16/2021 1.43 (H) 0.44 - 1.00 mg/dL Final          Passed - K in normal range and within 360 days    Potassium  Date Value Ref Range Status  04/16/2021 4.1 3.5 - 5.1 mmol/L Final  09/14/2014 4.4 mmol/L Final          Passed - Valid encounter within last 12 months    Recent Outpatient Visits           5 days ago Controlled diabetes mellitus with nephropathy [facility])   [facility] Birdie Sons, MD   4 months ago Frequent falls   [facility] Birdie Sons, MD   6 months ago Controlled diabetes mellitus with nephropathy [facility])   [facility] Birdie Sons, MD   1 year ago Other fatigue   [facility] Birdie Sons, MD   1 year ago Hemorrhoids, unspecified hemorrhoid type   [facility] Trinna Post, [place]       Future Appointments             In 6 months Fisher, Kirstie Peri, MD [facility]             pravastatin (PRAVACHOL) 40 MG tablet [Pharmacy Med Name:  Pravastatin Sodium 40 MG Oral Tablet] 90 tablet 0    Sig: Take 1 tablet by mouth once daily     Cardiovascular:  Antilipid - Statins Failed - 08/20/2021  8:15 AM      Failed - Total Cholesterol in normal range and within 360 days    Cholesterol, Total  Date Value Ref Range Status  04/26/2020 173 100 - 199 mg/dL Final  09/14/2014 187  Final          Failed - LDL in normal range and within 360 days    LDL Chol Calc (NIH)  Date Value Ref Range Status  04/26/2020 104 (H) 0 - 99 mg/dL Final          Failed - HDL in normal range and within 360 days    HDL  Date Value Ref Range Status  04/26/2020 45 >39 mg/dL Final          Failed - Triglycerides in normal range and within 360 days    Triglycerides  Date Value Ref Range Status  04/26/2020 137 0 - 149 mg/dL Final  09/14/2014 132  Final  Passed - Patient is not pregnant      Passed - Valid encounter within last 12 months    Recent Outpatient Visits           5 days ago Controlled diabetes mellitus with nephropathy [facility])   [facility] Birdie Sons, MD   4 months ago Frequent falls   [facility] Birdie Sons, MD   6 months ago Controlled diabetes mellitus with nephropathy [facility])   [facility] Birdie Sons, MD   1 year ago Other fatigue   [facility] Birdie Sons, MD   1 year ago Hemorrhoids, unspecified hemorrhoid type   [facility] Trinna Post, PA-C       Future Appointments             In 6 months Fisher, Kirstie Peri, MD [facility], [place]

## 2021-08-25 DIAGNOSIS — F03B4 Unspecified dementia, moderate, with anxiety: Secondary | ICD-10-CM | POA: Diagnosis not present

## 2021-08-25 DIAGNOSIS — K5900 Constipation, unspecified: Secondary | ICD-10-CM | POA: Diagnosis not present

## 2021-08-25 DIAGNOSIS — E119 Type 2 diabetes mellitus without complications: Secondary | ICD-10-CM | POA: Diagnosis not present

## 2021-08-25 DIAGNOSIS — I10 Essential (primary) hypertension: Secondary | ICD-10-CM | POA: Diagnosis not present

## 2021-09-09 DIAGNOSIS — E119 Type 2 diabetes mellitus without complications: Secondary | ICD-10-CM | POA: Diagnosis not present

## 2021-09-11 ENCOUNTER — Encounter: Payer: Self-pay | Admitting: Podiatry

## 2021-09-11 ENCOUNTER — Ambulatory Visit: Payer: Medicare HMO | Admitting: Podiatry

## 2021-09-11 ENCOUNTER — Other Ambulatory Visit: Payer: Self-pay

## 2021-09-11 DIAGNOSIS — E1121 Type 2 diabetes mellitus with diabetic nephropathy: Secondary | ICD-10-CM

## 2021-09-11 DIAGNOSIS — M79675 Pain in left toe(s): Secondary | ICD-10-CM

## 2021-09-11 DIAGNOSIS — F0280 Dementia in other diseases classified elsewhere without behavioral disturbance: Secondary | ICD-10-CM | POA: Diagnosis not present

## 2021-09-11 DIAGNOSIS — B351 Tinea unguium: Secondary | ICD-10-CM | POA: Diagnosis not present

## 2021-09-11 DIAGNOSIS — G309 Alzheimer's disease, unspecified: Secondary | ICD-10-CM

## 2021-09-11 DIAGNOSIS — M79674 Pain in right toe(s): Secondary | ICD-10-CM | POA: Diagnosis not present

## 2021-09-11 NOTE — Progress Notes (Signed)
This patient presents  to my office for at risk foot care.  This patient requires this care by a professional since this patient will be at risk due to having diabetes mellitus.   This patient is unable to cut nails herself since the patient cannot reach her nails.These nails are painful walking and wearing shoes. She presents to the office with her daughter. This patient presents for at risk foot care today.  General Appearance  Alert, conversant and in no acute stress.  Vascular  Dorsalis pedis  are palpable  bilaterally. Posterior tibial pulses are absent  B/L. Capillary return is within normal limits  bilaterally. Cold feet  Bilaterally.  Absent digital hair.  Neurologic Deferred.. Muscle power within normal limits bilaterally.  Nails Thick disfigured discolored nails with subungual debris  Hallux  B/L. No evidence of bacterial infection or drainage bilaterally.  Orthopedic  No limitations of motion  feet .  No crepitus or effusions noted.  No bony pathology or digital deformities noted.  Skin  normotropic skin with no porokeratosis noted bilaterally.  No signs of infections or ulcers noted.     Onychomycosis  Pain in right toes  Pain in left toes  Consent was obtained for treatment procedures.   Mechanical debridement of nails  bilaterally performed with a nail nipper.  Filed with dremel without incident.    Return office visit    4 months                  Told patient to return for periodic foot care and evaluation due to potential at risk complications.   Gardiner Barefoot DPM

## 2021-10-01 ENCOUNTER — Telehealth: Payer: Self-pay

## 2021-10-01 NOTE — Telephone Encounter (Signed)
I called patient's husband to clarify message below. Patients husband states that patient stopped taking Singulair a few months ago because she didn't need it. Mr. Tino says that patient now needs the medication to help with her allergies, but he wants to be sure its ok to resume taking the Singular again along with her other prescribed medications. He wants to make sure there is no interaction between her other medications. Please advise. Mr. Conrey is aware that Dr. Caryn Section is out of the office today.

## 2021-10-01 NOTE — Telephone Encounter (Signed)
Yes, she can start back on the Singulair. It doesn't interact with any over her medications.

## 2021-10-01 NOTE — Telephone Encounter (Signed)
Copied from [place] [phone]. Topic: General - Other >> Oct 01, 2021  3:12 PM Briana Austin wrote: Reason for CRM: The patient has not been taking their montelukast (SINGULAIR) 10 MG tablet [[id number]]  regularly and their daughter would like to discuss this medication irregularity further with [place] member of staff  The patient has not taken the medication within the past 30 days   The patient's daughter has concerns with the irregularity and would like to discuss further

## 2021-10-02 ENCOUNTER — Ambulatory Visit: Payer: Self-pay | Admitting: *Deleted

## 2021-10-02 DIAGNOSIS — Z87891 Personal history of nicotine dependence: Secondary | ICD-10-CM | POA: Diagnosis not present

## 2021-10-02 DIAGNOSIS — Z20822 Contact with and (suspected) exposure to covid-19: Secondary | ICD-10-CM | POA: Diagnosis not present

## 2021-10-02 DIAGNOSIS — R059 Cough, unspecified: Secondary | ICD-10-CM | POA: Diagnosis not present

## 2021-10-02 DIAGNOSIS — J40 Bronchitis, not specified as acute or chronic: Secondary | ICD-10-CM | POA: Diagnosis not present

## 2021-10-02 DIAGNOSIS — F0390 Unspecified dementia without behavioral disturbance: Secondary | ICD-10-CM | POA: Diagnosis not present

## 2021-10-02 NOTE — Telephone Encounter (Signed)
Daughter Briana Austin calling in.   My mother is just sleeping all the time for a couple of weeks.   She's on medication that makes her sleepy. She has a  Reason for Disposition  SEVERE coughing spells (e.g., whooping sound after coughing, vomiting after coughing)    We aren't sure if anything is wrong with her.   We want someone to listen to her lungs.   It's hard to get her in and out of the car.  Answer Assessment - Initial Assessment Questions 1. ONSET: "When did the cough begin?"      Daughter Briana Austin calling in.  Her mother is sleeping all the time for the last couple of weeks.   She has bad dementia.   She won't eat for the last couple of day.   She's under the care of a geriatric dr.   We try to get her to drink water.   But  it's hard to do.   She has a cough and now I have a cough too.    Her cough started the week before thanksgiving.    It's clear spit. 2. SEVERITY: "How bad is the cough today?"      She is coughing a lot 3. SPUTUM: "Describe the color of your sputum" (none, dry cough; clear, white, yellow, green)     Clear  no green  4. HEMOPTYSIS: "Are you coughing up any blood?" If so ask: "How much?" (flecks, streaks, tablespoons, etc.)     No 5. DIFFICULTY BREATHING: "Are you having difficulty breathing?" If Yes, ask: "How bad is it?" (e.g., mild, moderate, severe)    - MILD: No SOB at rest, mild SOB with walking, speaks normally in sentences, can lie down, no retractions, pulse < 100.    - MODERATE: SOB at rest, SOB with minimal exertion and prefers to sit, cannot lie down flat, speaks in phrases, mild retractions, audible wheezing, pulse 100-120.    - SEVERE: Very SOB at rest, speaks in single words, struggling to breathe, sitting hunched forward, retractions, pulse > 120      No shortness of breath She is not eating for the last couple of days.    Her normal breakfast is toast, coffee, oatmeal.   6. FEVER: "Do you have a fever?" If Yes, ask: "What is your temperature,  how was it measured, and when did it start?"     No fever   No pain but we can't go by that though with the dementia.     Sometimes she breaths hard.   No wheezing.   Hard breathing is more at night.   She screams a lot at night due to the dementia.   7. CARDIAC HISTORY: "Do you have any history of heart disease?" (e.g., heart attack, congestive heart failure)      No 8. LUNG HISTORY: "Do you have any history of lung disease?"  (e.g., pulmonary embolus, asthma, emphysema)     No 9. PE RISK FACTORS: "Do you have a history of blood clots?" (or: recent major surgery, recent prolonged travel, bedridden)     No 10. OTHER SYMPTOMS: "Do you have any other symptoms?" (e.g., runny nose, wheezing, chest pain)       She won't complain.   She's very constipated.   We give her stool softeners and all.   Her stools are very hard.  She doesn't drink or move around. 11. PREGNANCY: "Is there any chance you are pregnant?" "When was your last menstrual  period?"       N/A 12. TRAVEL: "Have you traveled out of the country in the last month?" (e.g., travel history, exposures)       No  doesn't go anywhere.  Protocols used: Cough - Acute Productive-A-AH

## 2021-10-02 NOTE — Telephone Encounter (Signed)
Daughter Briana Austin calling in.  Her mother is sleeping all the timefor the last couple of weeks and has not eaten much for the last 2 days.   They don't know what is wrong with her because she has severe dementia and is bedridden and does not talk so can't tell them what is wrong.   Requesting she be seen today if possible.  I warm transferred the call into [facility] to [place].  There are no appts today so she is talking with [name] about an appt tomorrow.   I left the conversation at this point.  I did instruct [name] the daughter to take her mother on to the ED if she became worse or her breathing became labored.   She verbalized understanding.

## 2021-10-03 ENCOUNTER — Ambulatory Visit: Payer: Medicare HMO | Admitting: Physician Assistant

## 2021-10-03 NOTE — Telephone Encounter (Signed)
Pt was seen in the ED yesterday and prescribed antibiotics.  Thanks,   -Briana Austin

## 2021-10-08 ENCOUNTER — Telehealth: Payer: Self-pay | Admitting: Family Medicine

## 2021-10-08 NOTE — Telephone Encounter (Signed)
I called and spoke with patient's daughter [name] and advised her of message below. [name] is going to call us back once she finds the medical supply store she wants to use. I will hold on to order at my desk until [place] calls back.

## 2021-10-08 NOTE — Telephone Encounter (Signed)
They need to make sure the order is for an electrical bed/ they advised the med supply store is [facility] / fax# [phone]/ phone# [phone] make it to ATTENTION: INTAKE

## 2021-10-08 NOTE — Telephone Encounter (Signed)
Dr. Caryn Section, the order that you printed is for a "Semi-Electric" hospital bed. The request in the message below states that the order needs to be for an "Electric hospital bed". Please advise if your would like to keep the order as it is written for a "semi-electric bed", or whether the order need to be changed  to "Electric hospital bed" and  reprinted.

## 2021-10-08 NOTE — Telephone Encounter (Signed)
Have printed up order for hospital bed, but don't know where they it want it sent to.

## 2021-10-08 NOTE — Telephone Encounter (Signed)
Pt Daughter called and stated that she has a concern for patient chocking because she is in the bed all the time and is not lifted up. She would like a call back from a nurse regarding. Patient health is declining. They will need a hospital bed that patient husband can can control. [employer] will pay for hospital bed but will need a PA and then an RX. Please advise  Pt daughter states that she has a persistent cough that is going away. They went to ER and was prescribed antibiotics but patient is still not getting better.

## 2021-10-10 NOTE — Telephone Encounter (Signed)
Order faxed.

## 2021-10-10 NOTE — Telephone Encounter (Signed)
Have printed new order for electric hospital bed.

## 2021-10-14 NOTE — Telephone Encounter (Signed)
Briana Austin with [facility] called in to inform Dr Caryn Section that they are still waiting on notes and narrative to be faxed back so they can complete the order for patients hospital bed Ph# [phone]

## 2021-10-14 NOTE — Telephone Encounter (Signed)
Hi [place],   Have you seen this order?   Thanks,   -Mickel Baas

## 2021-10-16 NOTE — Telephone Encounter (Signed)
Pt daughter calling in checking on the status of pt/s hospital bed order, she is requesting a call back [phone]

## 2021-10-17 NOTE — Telephone Encounter (Signed)
Pts need to pick up, fax will not send

## 2021-10-17 NOTE — Telephone Encounter (Signed)
Patient states will pick up orders today.

## 2021-10-20 ENCOUNTER — Telehealth: Payer: Self-pay | Admitting: Family Medicine

## 2021-10-20 ENCOUNTER — Ambulatory Visit: Payer: Self-pay | Admitting: *Deleted

## 2021-10-20 DIAGNOSIS — E1121 Type 2 diabetes mellitus with diabetic nephropathy: Secondary | ICD-10-CM

## 2021-10-20 NOTE — Telephone Encounter (Signed)
°  Chief Complaint: medication will not crush Symptoms: patient is now having to take her medications crushed and one of her pills will not crush Frequency:  Pertinent Negatives: Patient denies  Disposition: [] ED /[] Urgent Care (no appt availability in office) / [] Appointment(In office/virtual)/ []  [facility]/ [] Home Care/ [] Refused Recommended Disposition  Additional Notes: Medication advised request

## 2021-10-20 NOTE — Addendum Note (Signed)
Addended by: Birdie Sons on: 10/20/2021 02:59 PM   Modules accepted: Orders

## 2021-10-20 NOTE — Telephone Encounter (Signed)
Patients daughter called in about by being told she needs reason why hospital bed is needed. She states is needed to elevate patients had to feed her and so she wont choke on saliva. Please call back

## 2021-10-20 NOTE — Telephone Encounter (Signed)
-----   Message from Tula Nakayama sent at 10/20/2021  8:53 AM EST -----  Patients daughter called and would like to speak to a nurse about patients sugar medication which she believes its  Reason for Disposition  [1] Caller has NON-URGENT medicine question about med that PCP prescribed AND [2] triager unable to answer question  Answer Assessment - Initial Assessment Questions 1. NAME of MEDICATION: "What medicine are you calling about?"     Glipizide 2. QUESTION: "What is your question?" (e.g., double dose of medicine, side effect)     Can not crush 3. PRESCRIBING HCP: "Who prescribed it?" Reason: if prescribed by specialist, call should be referred to that group.     PCP Patient is having to have all medications crushed now and her daughter states the glipizide will not crush. She bought an expensive pill crusher and it still won't crush. Patient is asking if this medication comes in another form or do we to change it? Advised will send message to provider for review  Protocols used: Medication Question Call-A-AH

## 2021-10-20 NOTE — Telephone Encounter (Signed)
Please advise 

## 2021-10-20 NOTE — Telephone Encounter (Signed)
She is currently getting the XR form. The immediate release might be easier to crush. Can send that prescription in if she wants to try it.

## 2021-10-21 ENCOUNTER — Telehealth: Payer: Self-pay | Admitting: *Deleted

## 2021-10-21 MED ORDER — GLIPIZIDE 5 MG PO TABS: 5.0000 mg | ORAL_TABLET | Freq: Every day | ORAL | 5 refills | Status: DC

## 2021-10-21 NOTE — Telephone Encounter (Signed)
Daughter calling to expedite the hospital bed.  She states they really need.  The bed is ready to be delivered, but they are waiting on documentation WHY she needs bed. Briana Austin says she can pick up documentation if you just get it ready.  [phone]

## 2021-10-21 NOTE — Addendum Note (Signed)
Addended by: Tarry Kos on: 10/21/2021 09:02 AM   Modules accepted: Orders

## 2021-10-21 NOTE — Telephone Encounter (Signed)
Spoke with the patient's daughter. She will try the IR Glipizide.

## 2021-10-21 NOTE — Telephone Encounter (Signed)
Opened in error

## 2021-10-21 NOTE — Telephone Encounter (Signed)
Daughter has called again asking for an update on getting this bed ordered.

## 2021-10-22 ENCOUNTER — Telehealth: Payer: Self-pay

## 2021-10-22 NOTE — Telephone Encounter (Signed)
Copied from [place] [id number]. Topic: General - Other >> Oct 22, 2021  4:44 PM Yvette Rack wrote: Reason for CRM: Pt daughter requested that Linus Salmons return her call before she leaves. Cb# [phone]

## 2021-10-22 NOTE — Telephone Encounter (Signed)
Daughter states the letter for the bed is not saying the correct information The letter needs to say the following exactly how this reads Pt is requesting the bed because repositioning her is not possible in ways that can be done in a standard bed,( ie: standard bed does not raise up the head)   In the letter, it must state "why she needs the hospital bed.  So she does not choke.  Briana Austin would like to pick up this morning.

## 2021-10-22 NOTE — Telephone Encounter (Signed)
Copied from [place] [id number]. Topic: General - Other >> Oct 22, 2021 12:20 PM Tessa Lerner A wrote: Reason for CRM: Dusti with [employer] would like to speak to a member of staff when possible about a previously submitted letter for the patient's hospital bed   The current letter states that the patient requires a hospital bed due to being unable to transfer to a wheelchair - this has been denied   The letter must state "the patient requires positioning of the body that they cannot do in a standard bed" per [employer]'s guidelines   Please contact Dusti further when possible

## 2021-10-22 NOTE — Telephone Encounter (Signed)
New letter created and faxed back to [employer] [facility]. I called and advised Briana Austin of this. I also advised her to call back if anything else is missing. I called patient's daughter Briana Austin ad advised her that a new letter has been submitted.

## 2021-10-22 NOTE — Telephone Encounter (Addendum)
New letter created (signed by Tally Joe, FNP) and faxed back to [employer]. I called and advised Dusti of this. I also advised her to call back if anything else is missing. I called patient's daughter [name] and advised her that a new letter has been submitted.

## 2021-10-22 NOTE — Telephone Encounter (Signed)
Copied from [place] [id number]. Topic: General - Other >> Oct 17, 2021 11:30 AM Yvette Rack wrote: Reason for CRM: Pt daughter requests that the order for the hospital bed be faxed directly to her at [phone] and she will make sure it gets to the location. >> Oct 22, 2021  9:21 AM Yvette Rack wrote: Pt daughter reports that pt insurance will not accept the documentation on letterhead and she was told the exact same information needed to be updated on pt medical chart for the bed. Pt daughter stated she needs this done asap because her son-in law who will be helping remove the old bed will only be here for the Christmas holiday.

## 2021-10-31 DIAGNOSIS — Z20822 Contact with and (suspected) exposure to covid-19: Secondary | ICD-10-CM | POA: Diagnosis not present

## 2021-10-31 DIAGNOSIS — Z8673 Personal history of transient ischemic attack (TIA), and cerebral infarction without residual deficits: Secondary | ICD-10-CM | POA: Diagnosis not present

## 2021-10-31 DIAGNOSIS — F03911 Unspecified dementia, unspecified severity, with agitation: Secondary | ICD-10-CM | POA: Diagnosis not present

## 2021-10-31 DIAGNOSIS — I152 Hypertension secondary to endocrine disorders: Secondary | ICD-10-CM | POA: Diagnosis not present

## 2021-10-31 DIAGNOSIS — D649 Anemia, unspecified: Secondary | ICD-10-CM | POA: Diagnosis not present

## 2021-10-31 DIAGNOSIS — R4182 Altered mental status, unspecified: Secondary | ICD-10-CM | POA: Diagnosis not present

## 2021-10-31 DIAGNOSIS — E1159 Type 2 diabetes mellitus with other circulatory complications: Secondary | ICD-10-CM | POA: Diagnosis not present

## 2021-10-31 DIAGNOSIS — Z79899 Other long term (current) drug therapy: Secondary | ICD-10-CM | POA: Diagnosis not present

## 2021-10-31 DIAGNOSIS — F0390 Unspecified dementia without behavioral disturbance: Secondary | ICD-10-CM | POA: Diagnosis not present

## 2021-10-31 DIAGNOSIS — E785 Hyperlipidemia, unspecified: Secondary | ICD-10-CM | POA: Diagnosis not present

## 2021-10-31 DIAGNOSIS — R569 Unspecified convulsions: Secondary | ICD-10-CM | POA: Diagnosis not present

## 2021-11-01 DIAGNOSIS — R4182 Altered mental status, unspecified: Secondary | ICD-10-CM | POA: Diagnosis not present

## 2021-11-02 ENCOUNTER — Emergency Department
Admission: EM | Admit: 2021-11-02 | Discharge: 2021-11-02 | Disposition: A | Discharge status: Home / Self Care | Payer: Medicare HMO | Attending: Emergency Medicine | Admitting: Emergency Medicine

## 2021-11-02 ENCOUNTER — Other Ambulatory Visit: Payer: Self-pay

## 2021-11-02 ENCOUNTER — Emergency Department: Payer: Medicare HMO

## 2021-11-02 DIAGNOSIS — M4802 Spinal stenosis, cervical region: Secondary | ICD-10-CM | POA: Diagnosis not present

## 2021-11-02 DIAGNOSIS — R0902 Hypoxemia: Secondary | ICD-10-CM | POA: Diagnosis not present

## 2021-11-02 DIAGNOSIS — R4182 Altered mental status, unspecified: Secondary | ICD-10-CM | POA: Diagnosis not present

## 2021-11-02 DIAGNOSIS — I700 Atherosclerosis of aorta: Secondary | ICD-10-CM | POA: Diagnosis not present

## 2021-11-02 DIAGNOSIS — M47812 Spondylosis without myelopathy or radiculopathy, cervical region: Secondary | ICD-10-CM | POA: Diagnosis not present

## 2021-11-02 DIAGNOSIS — Z5321 Procedure and treatment not carried out due to patient leaving prior to being seen by health care provider: Secondary | ICD-10-CM | POA: Diagnosis not present

## 2021-11-02 DIAGNOSIS — Z87891 Personal history of nicotine dependence: Secondary | ICD-10-CM | POA: Diagnosis not present

## 2021-11-02 DIAGNOSIS — Z7982 Long term (current) use of aspirin: Secondary | ICD-10-CM | POA: Diagnosis not present

## 2021-11-02 DIAGNOSIS — Z043 Encounter for examination and observation following other accident: Secondary | ICD-10-CM | POA: Diagnosis not present

## 2021-11-02 DIAGNOSIS — R9431 Abnormal electrocardiogram [ECG] [EKG]: Secondary | ICD-10-CM | POA: Diagnosis not present

## 2021-11-02 DIAGNOSIS — F0390 Unspecified dementia without behavioral disturbance: Secondary | ICD-10-CM | POA: Diagnosis not present

## 2021-11-02 DIAGNOSIS — S0990XA Unspecified injury of head, initial encounter: Secondary | ICD-10-CM | POA: Diagnosis not present

## 2021-11-02 DIAGNOSIS — R404 Transient alteration of awareness: Secondary | ICD-10-CM | POA: Diagnosis not present

## 2021-11-02 DIAGNOSIS — M4312 Spondylolisthesis, cervical region: Secondary | ICD-10-CM | POA: Diagnosis not present

## 2021-11-02 DIAGNOSIS — W01198A Fall on same level from slipping, tripping and stumbling with subsequent striking against other object, initial encounter: Secondary | ICD-10-CM | POA: Insufficient documentation

## 2021-11-02 DIAGNOSIS — S199XXA Unspecified injury of neck, initial encounter: Secondary | ICD-10-CM | POA: Diagnosis not present

## 2021-11-02 DIAGNOSIS — W19XXXA Unspecified fall, initial encounter: Secondary | ICD-10-CM | POA: Diagnosis not present

## 2021-11-02 DIAGNOSIS — S0083XA Contusion of other part of head, initial encounter: Secondary | ICD-10-CM | POA: Diagnosis not present

## 2021-11-02 DIAGNOSIS — E119 Type 2 diabetes mellitus without complications: Secondary | ICD-10-CM | POA: Diagnosis not present

## 2021-11-02 DIAGNOSIS — R001 Bradycardia, unspecified: Secondary | ICD-10-CM | POA: Diagnosis not present

## 2021-11-02 LAB — COMPREHENSIVE METABOLIC PANEL
ALT: 24 U/L (ref 0–44)
AST: 25 U/L (ref 15–41)
Albumin: 3.7 g/dL (ref 3.5–5.0)
Alkaline Phosphatase: 60 U/L (ref 38–126)
Anion gap: 9 (ref 5–15)
BUN: 14 mg/dL (ref 8–23)
CO2: 26 mmol/L (ref 22–32)
Calcium: 11.2 mg/dL — ABNORMAL HIGH (ref 8.9–10.3)
Chloride: 100 mmol/L (ref 98–111)
Creatinine, Ser: 1.19 mg/dL — ABNORMAL HIGH (ref 0.44–1.00)
GFR, Estimated: 45 mL/min — ABNORMAL LOW (ref >60)
Glucose, Bld: 198 mg/dL — ABNORMAL HIGH (ref 70–99)
Potassium: 4 mmol/L (ref 3.5–5.1)
Sodium: 135 mmol/L (ref 135–145)
Total Bilirubin: 0.6 mg/dL (ref 0.3–1.2)
Total Protein: 7.8 g/dL (ref 6.5–8.1)

## 2021-11-02 LAB — CBC WITH DIFFERENTIAL/PLATELET
Abs Immature Granulocytes: 0.02 10*3/uL (ref 0.00–0.07)
Basophils Absolute: 0 10*3/uL (ref 0.0–0.1)
Basophils Relative: 1 %
Eosinophils Absolute: 0.3 10*3/uL (ref 0.0–0.5)
Eosinophils Relative: 3 %
HCT: 35.1 % — ABNORMAL LOW (ref 36.0–46.0)
Hemoglobin: 11.3 g/dL — ABNORMAL LOW (ref 12.0–15.0)
Immature Granulocytes: 0 %
Lymphocytes Relative: 29 %
Lymphs Abs: 2.5 10*3/uL (ref 0.7–4.0)
MCH: 31 pg (ref 26.0–34.0)
MCHC: 32.2 g/dL (ref 30.0–36.0)
MCV: 96.4 fL (ref 80.0–100.0)
Monocytes Absolute: 0.6 10*3/uL (ref 0.1–1.0)
Monocytes Relative: 7 %
Neutro Abs: 5.2 10*3/uL (ref 1.7–7.7)
Neutrophils Relative %: 60 %
Platelets: 243 10*3/uL (ref 150–400)
RBC: 3.64 MIL/uL — ABNORMAL LOW (ref 3.87–5.11)
RDW: 14.8 % (ref 11.5–15.5)
WBC: 8.6 10*3/uL (ref 4.0–10.5)
nRBC: 0 % (ref 0.0–0.2)

## 2021-11-02 LAB — TROPONIN I (HIGH SENSITIVITY): Troponin I (High Sensitivity): 8 ng/L (ref <18)

## 2021-11-02 NOTE — ED Triage Notes (Signed)
Pt presents to ER via ems from home.  Pt with hx of dementia and seen last night for possible decline in mental status.  Pt had mechanical fall tonight, hitting head on ground.  Pt has large hematoma above left eye with small laceration noted with bleeding controlled.  Pt VSS w/ems.

## 2021-11-02 NOTE — ED Notes (Signed)
Family with patient state they are going to take patient to [facility].

## 2021-11-02 NOTE — ED Provider Notes (Signed)
°  Emergency Medicine Provider Triage Evaluation Note  Briana Austin , a 88 y.o.female,  was evaluated in triage.  Pt complains of head injury from fall.  Per EMS, patient had a mechanical fall and fell on her left side.  The left side of her head hit the ground.  Denies LOC or vomiting.  Patient is unable to recall exactly how it happened.  She currently does not endorse any pain at this time.   Review of Systems  Positive: Head injury Negative: Denies fever, chest pain, vomiting  Physical Exam   Vitals:   11/02/21 1909  BP: 95/84  Pulse: 85  Resp: 16  Temp: 98.4 F (36.9 C)  SpO2: 98%   Gen:   Awake, no distress   Resp:  Normal effort  MSK:   Moves extremities without difficulty  Other:    Medical Decision Making  Given the patient's initial medical screening exam, the following diagnostic evaluation has been ordered. The patient will be placed in the appropriate treatment space, once one is available, to complete the evaluation and treatment. I have discussed the plan of care with the patient and I have advised the patient that an ED physician or mid-level practitioner will reevaluate their condition after the test results have been received, as the results may give them additional insight into the type of treatment they may need.    Diagnostics: Labs, UA, CXR, head CT, cervical spine CT  Treatments: none immediately   Teodoro Spray, PA 11/02/21 1910    Lucrezia Starch, MD 11/02/21 [phone]

## 2021-11-03 DIAGNOSIS — E119 Type 2 diabetes mellitus without complications: Secondary | ICD-10-CM | POA: Diagnosis not present

## 2021-11-04 ENCOUNTER — Telehealth: Payer: Self-pay

## 2021-11-04 NOTE — Telephone Encounter (Signed)
I called patient's daughter [name] to clarify message below. [name] says that she is placing patient in a nursing home ([facility]). She needs an FL-2 form completed. At the time of my call, [name] was at the nursing home talking with the administrative staff. [name] put me on speaker phone. The administrative staff agreed to fax over a blank FL-2 form in case we didn't have one available. The Administrative staff member states that the FL-2 form needs to reflect "memory care".   [name] needs paperwork completed and turned into the nursing home by Friday 11/07/2021. [name] will be picking the FL-2 form up from our office. Please call her cell phone when its ready. Form placed on Dr. Maralyn Sago desk.

## 2021-11-04 NOTE — Telephone Encounter (Signed)
Copied from [place] [id number]. Topic: General - Inquiry >> Nov 04, 2021 11:13 AM Robina Ade, [name] D wrote: Reason for CRM: Patient daughter [name] called and would like for Dr. Caryn Section to filed out a FL2 form for patient for her to enter a facility. Ms. Gilford Rile can be reached at [phone].

## 2021-11-04 NOTE — Telephone Encounter (Addendum)
Patients daughter came by the office and dropped off the FL-2 form that needs to be completed by 11/07/2021.  She says the FL-2 form needs to reflect " memory care" and also include that patient drinks ensure.

## 2021-11-05 ENCOUNTER — Telehealth: Payer: Self-pay

## 2021-11-05 DIAGNOSIS — F0280 Dementia in other diseases classified elsewhere without behavioral disturbance: Secondary | ICD-10-CM

## 2021-11-05 DIAGNOSIS — G309 Alzheimer's disease, unspecified: Secondary | ICD-10-CM

## 2021-11-05 DIAGNOSIS — G459 Transient cerebral ischemic attack, unspecified: Secondary | ICD-10-CM

## 2021-11-05 NOTE — Addendum Note (Signed)
Addended by: Meyer Cory L on: 11/05/2021 11:21 AM   Modules accepted: Orders

## 2021-11-05 NOTE — Telephone Encounter (Addendum)
Patients daughter [name] is here in the office requesting that Dr. Caryn Section place a STAT referral for patient to be placed in a hospice facility.  [name] was initially going to put patient in a nursing home, but the nursing home said that patient is "too far gone". [name] wants the hospice referral completed ASAP so that patient can be taken to the facility today. Please advise.   [name] wants to use [facility]. Their phone number is [phone]. Fax number is [phone]. I called [facility] and was advised that they would need patients demographics, History and physical and office notes or progress notes. They also need to know if Dr. Caryn Section is willing to serve as the attending physician.

## 2021-11-05 NOTE — Telephone Encounter (Signed)
Copied from [place] [id number]. Topic: Referral - Request for Referral >> Nov 04, 2021  4:34 PM Alanda Slim E wrote: Has patient seen PCP for this complaint? Yes  *If NO, is insurance requiring patient see PCP for this issue before PCP can refer them? Referral for which specialty: Hospice  Preferred provider/office:  Reason for referral: stage 7 Alzheimer's

## 2021-11-05 NOTE — Telephone Encounter (Signed)
Caller would like to know if PCP can expedite the referral to hospice, caller will be bringing her father to the practice today for appointment and would like this completed by then. Caller states its urgent.

## 2021-11-05 NOTE — Addendum Note (Signed)
Addended by: Lelon Huh E on: 11/05/2021 11:50 AM   Modules accepted: Orders

## 2021-11-07 ENCOUNTER — Telehealth: Payer: Self-pay | Admitting: Family Medicine

## 2021-11-07 ENCOUNTER — Telehealth: Payer: Self-pay

## 2021-11-07 DIAGNOSIS — R451 Restlessness and agitation: Secondary | ICD-10-CM

## 2021-11-07 DIAGNOSIS — K5900 Constipation, unspecified: Secondary | ICD-10-CM

## 2021-11-07 MED ORDER — HALOPERIDOL 1 MG PO TABS: 1.0000 mg | ORAL_TABLET | Freq: Four times a day (QID) | ORAL | 1 refills | Status: DC | PRN

## 2021-11-07 MED ORDER — POLYETHYLENE GLYCOL 3350 17 GM/SCOOP PO POWD
17.0000 g | Freq: Two times a day (BID) | ORAL | 1 refills | Status: DC | PRN
Start: 2021-11-07 — End: 2024-03-01

## 2021-11-07 NOTE — Telephone Encounter (Signed)
Have [facility] [address]

## 2021-11-07 NOTE — Telephone Encounter (Signed)
Hospice nurse called to request Haldol or Seroquel  [facility] [address] ([address]), [name]  [place]) [place] [address]  Phone: [phone] Fax: [phone]  [name] called to relay message from nurse Best contact: [phone]

## 2021-11-07 NOTE — Telephone Encounter (Signed)
Hospice nurse Jeanine advised.

## 2021-11-07 NOTE — Telephone Encounter (Signed)
I spoke with Briana Austin.  She is also requesting miralax in addition to the Seroquel or haldol at night.     Thanks,   -Mickel Baas

## 2021-11-07 NOTE — Telephone Encounter (Signed)
Briana Austin states they are needed the prescription for both sleep and agitation.   Thanks,   -Mickel Baas

## 2021-11-07 NOTE — Telephone Encounter (Signed)
Need more info. What is the reason she is requesting this... for sleep, or for agitation?

## 2021-11-07 NOTE — Telephone Encounter (Signed)
Copied from [place] [id number]. Topic: General - Other >> Nov 07, 2021  1:32 PM Tessa Lerner A wrote: Reason for CRM: Jeanine with [facility] has called requesting to speak with a member of clinical staff when available  Ashby Dawes has additional concerns related to patient medications and would like to address them further / order medications  Please contact when available

## 2021-12-18 ENCOUNTER — Ambulatory Visit: Payer: Medicare HMO | Admitting: Podiatry

## 2021-12-18 ENCOUNTER — Telehealth: Payer: Self-pay

## 2021-12-18 NOTE — Telephone Encounter (Signed)
Patient's daughter [name]) wants to know if Dr. Caryn Section will fill out travelers insurance forms so that she can receive a refund from a cruise trip that she was unable to go on due to her mothers declining health. [name] states, she had a cruise planned for 11/05/2021. She didn't go because her mom was placed in Hospice care and they didn't expect her to live. [name] has travelers insurance on the cruise but needs a physician statement explaining the diagnosis of the patient. [name] is faxing over the form for Dr. Caryn Section to review and fill out if possible.

## 2021-12-18 NOTE — Telephone Encounter (Signed)
Copied from [place] [id number]. Topic: General - Other >> Dec 18, 2021  1:21 PM Pawlus, Brayton Layman A wrote: Reason for CRM: Pts daughter called in needing advice on how to complete a form regarding the pt, pt requested a call back.

## 2021-12-31 ENCOUNTER — Ambulatory Visit: Payer: Self-pay

## 2021-12-31 DIAGNOSIS — F0280 Dementia in other diseases classified elsewhere without behavioral disturbance: Secondary | ICD-10-CM

## 2021-12-31 NOTE — Telephone Encounter (Signed)
Mitzi from [facility] calling to see if pt could be prescribed Seroquel 25mg  for either as needed basis or scheduled d/t when pt is awake she is agitated, restless, and calling out according to the case manager. Advised I will send this to Dr. Caryn Section and get back in touch with her.  ? ?Reason for Disposition ? Prescription request for new medicine (not a refill) ? ?Answer Assessment - Initial Assessment Questions ?1. NAME of MEDICATION: "What medicine are you calling about?" ?    Seroquel ?2. QUESTION: "What is your question?" (e.g., double dose of medicine, side effect) ?    Could it be restarted  ?3. PRESCRIBING HCP: "Who prescribed it?" Reason: if prescribed by specialist, call should be referred to that group. ?    Dr. Caryn Section ?4. SYMPTOMS: "Do you have any symptoms?" ?    When awake restless agitated and calling out ? ?Protocols used: Medication Question Call-A-AH ? ?

## 2022-01-01 MED ORDER — QUETIAPINE FUMARATE 25 MG PO TABS: 25.0000 mg | ORAL_TABLET | Freq: Three times a day (TID) | ORAL | 1 refills | Status: DC | PRN

## 2022-01-01 NOTE — Telephone Encounter (Signed)
OK, have [facility] to [facility] in [address] ?

## 2022-01-01 NOTE — Telephone Encounter (Signed)
I returned Mitzi's call and advised her of message below.  ?

## 2022-01-01 NOTE — Addendum Note (Signed)
Addended by: Birdie Sons on: 01/01/2022 09:15 AM ? ? Modules accepted: Orders ? ?

## 2022-01-14 ENCOUNTER — Other Ambulatory Visit: Payer: Self-pay | Admitting: Family Medicine

## 2022-01-14 DIAGNOSIS — E119 Type 2 diabetes mellitus without complications: Secondary | ICD-10-CM

## 2022-01-16 ENCOUNTER — Other Ambulatory Visit: Payer: Self-pay | Admitting: Family Medicine

## 2022-01-16 DIAGNOSIS — E1121 Type 2 diabetes mellitus with diabetic nephropathy: Secondary | ICD-10-CM

## 2022-01-16 MED ORDER — ACCU-CHEK GUIDE VI STRP: ORAL_STRIP | 4 refills | Status: DC

## 2022-01-16 MED ORDER — ACCU-CHEK GUIDE ME W/DEVICE KIT: PACK | 0 refills | Status: DC

## 2022-02-16 ENCOUNTER — Ambulatory Visit: Payer: Medicare HMO | Admitting: Family Medicine

## 2022-03-16 ENCOUNTER — Telehealth: Payer: Self-pay | Admitting: Family Medicine

## 2022-03-16 MED ORDER — CIPROFLOXACIN HCL 0.3 % OP SOLN: OPHTHALMIC | 0 refills | Status: DC

## 2022-03-16 NOTE — Telephone Encounter (Signed)
Briana Austin w/ [facility] calling to ask that something be called in for the pt for pink eye. ?The daughter/caretaker is at the dr this afternoon for pink eye and fever. ?Briana Austin saw pt today and appears she is getting it as well. ?Right Eye is swollen shut, w/ yellow green drainage. ?Please advise ? ?[place] [address] [place] ? ?Cb [phone] ? ?Pt is under Hospice care and hopes Dr Caryn Section will call something in. ?

## 2022-03-20 ENCOUNTER — Telehealth: Payer: Self-pay

## 2022-03-20 NOTE — Telephone Encounter (Signed)
Copied from [place] [id number] [phone]. Topic: General - Call Back - No Documentation >> Mar 19, 2022  4:28 PM Erick Blinks wrote: Reason for CRM: Pt's daughter is requesting to speak to the nurse regarding paperwork for her hospital bed. Call back request   Best contact: [phone]

## 2022-04-01 NOTE — Telephone Encounter (Addendum)
Patient's daughter [name] called to let Dr. Caryn Section know that the Hospice nurse says patients condition shows improvement and she has "graduated" from hospice. Patients hospice service will end soon. [name] states that the hospital bed that patient is being paid for my Hospice. [name] wants to keep the hospital bed and have patients insurance take over payments. [name] says that the Hospice nurse should be calling our office soon to explain what needs to be done in order for patient to keep the hospital bed.

## 2022-04-07 ENCOUNTER — Telehealth: Payer: Self-pay

## 2022-04-07 NOTE — Telephone Encounter (Signed)
Returned [employer]. Message was about patient's husband. New message created in his chart.

## 2022-04-07 NOTE — Telephone Encounter (Signed)
Copied from [place] [phone]. Topic: General - Other >> Apr 07, 2022  1:50 PM Pawlus, Brayton Layman A wrote: Reason for CRM: Pts daughter called in requesting to speak directly with Linus Salmons, caller was asking about a scooter for the pt, please advise.

## 2022-05-19 ENCOUNTER — Other Ambulatory Visit: Payer: Self-pay | Admitting: Family Medicine

## 2022-05-19 DIAGNOSIS — F0280 Dementia in other diseases classified elsewhere without behavioral disturbance: Secondary | ICD-10-CM

## 2022-05-20 NOTE — Telephone Encounter (Signed)
Requested medication (s) are due for refill today: yes  Requested medication (s) are on the active medication list: yes  Last refill:  01/01/22 #60 1 RF  Future visit scheduled: no  Notes to clinic:   called to make appt- called and spoke with pt's spouse pt is bed bound and unable to come into office. Pt has 2 days of medicine left.    Requested Prescriptions  Pending Prescriptions Disp Refills   QUEtiapine (SEROQUEL) 25 MG tablet [Pharmacy Med Name: QUEtiapine Fumarate 25 MG Oral Tablet] 45 tablet 0    Sig: Take 1 tablet by mouth three times daily as needed     Not Delegated - Psychiatry:  Antipsychotics - Second Generation (Atypical) - quetiapine Failed - 05/19/2022  5:25 PM      Failed - This refill cannot be delegated      Failed - TSH in normal range and within 360 days    TSH  Date Value Ref Range Status  01/31/2021 1.170 0.450 - 4.500 uIU/mL Final         Failed - Valid encounter within last 6 months    Recent Outpatient Visits           9 months ago Controlled diabetes mellitus with nephropathy [facility])   [facility] Birdie Sons, MD   1 year ago Frequent falls   [facility] Birdie Sons, MD   1 year ago Controlled diabetes mellitus with nephropathy [facility])   [facility] Birdie Sons, MD   2 years ago Other fatigue   [facility] Birdie Sons, MD   2 years ago Hemorrhoids, unspecified hemorrhoid type   [facility], Adriana M, PA-C              Failed - Lipid Panel in normal range within the last 12 months    Cholesterol, Total  Date Value Ref Range Status  04/26/2020 173 100 - 199 mg/dL Final  09/14/2014 187  Final   LDL Chol Calc (NIH)  Date Value Ref Range Status  04/26/2020 104 (H) 0 - 99 mg/dL Final   HDL  Date Value Ref Range Status  04/26/2020 45 >39 mg/dL Final   Triglycerides  Date Value Ref Range Status  04/26/2020 137 0 - 149 mg/dL Final   09/14/2014 132  Final         Passed - Last BP in normal range    BP Readings from Last 1 Encounters:  11/02/21 95/84         Passed - Last Heart Rate in normal range    Pulse Readings from Last 1 Encounters:  11/02/21 85         Passed - CBC within normal limits and completed in the last 12 months    WBC  Date Value Ref Range Status  11/02/2021 8.6 4.0 - 10.5 K/uL Final   RBC  Date Value Ref Range Status  11/02/2021 3.64 (L) 3.87 - 5.11 MIL/uL Final   Hemoglobin  Date Value Ref Range Status  11/02/2021 11.3 (L) 12.0 - 15.0 g/dL Final  01/31/2021 11.2 11.1 - 15.9 g/dL Final   HCT  Date Value Ref Range Status  11/02/2021 35.1 (L) 36.0 - 46.0 % Final   Hematocrit  Date Value Ref Range Status  01/31/2021 33.1 (L) 34.0 - 46.6 % Final   MCHC  Date Value Ref Range Status  11/02/2021 32.2 30.0 - 36.0 g/dL Final   MCH  Date  Value Ref Range Status  11/02/2021 31.0 26.0 - 34.0 pg Final   MCV  Date Value Ref Range Status  11/02/2021 96.4 80.0 - 100.0 fL Final  01/31/2021 93 79 - 97 fL Final  01/25/2014 98 80 - 100 fL Final   No results found for: "PLTCOUNTKUC", "LABPLAT", "POCPLA" RDW  Date Value Ref Range Status  11/02/2021 14.8 11.5 - 15.5 % Final  01/31/2021 12.6 11.7 - 15.4 % Final  01/25/2014 14.1 11.5 - 14.5 % Final         Passed - CMP within normal limits and completed in the last 12 months    Albumin  Date Value Ref Range Status  11/02/2021 3.7 3.5 - 5.0 g/dL Final  01/31/2021 4.3 3.6 - 4.6 g/dL Final  01/25/2014 3.6 3.4 - 5.0 g/dL Final   Alkaline Phosphatase  Date Value Ref Range Status  11/02/2021 60 38 - 126 U/L Final  01/25/2014 60 Unit/L Final    Comment:    45-117 NOTE: New Reference Range 09/22/13    Alkaline phosphatase (APISO)  Date Value Ref Range Status  10/18/2017 55 33 - 130 U/L Final   ALT  Date Value Ref Range Status  11/02/2021 24 0 - 44 U/L Final   SGPT (ALT)  Date Value Ref Range Status  01/25/2014 26 12 - 78 U/L  Final   AST  Date Value Ref Range Status  11/02/2021 25 15 - 41 U/L Final   SGOT(AST)  Date Value Ref Range Status  01/25/2014 22 15 - 37 Unit/L Final   BUN  Date Value Ref Range Status  11/02/2021 14 8 - 23 mg/dL Final  01/31/2021 23 8 - 27 mg/dL Final  09/14/2014 20  Final   Calcium  Date Value Ref Range Status  11/02/2021 11.2 (H) 8.9 - 10.3 mg/dL Final   Calcium, Total  Date Value Ref Range Status  01/25/2014 10.0 8.5 - 10.1 mg/dL Final   Calcium, Ion  Date Value Ref Range Status  07/03/2015 1.27 1.13 - 1.30 mmol/L Final   CO2  Date Value Ref Range Status  11/02/2021 26 22 - 32 mmol/L Final   Co2  Date Value Ref Range Status  01/25/2014 24 21 - 32 mmol/L Final   TCO2  Date Value Ref Range Status  07/03/2015 19 0 - 100 mmol/L Final   Creat  Date Value Ref Range Status  10/18/2017 1.37 (H) 0.60 - 0.88 mg/dL Final    Comment:    For patients >28 years of age, the reference limit for Creatinine is approximately 13% higher for people identified as African-American. .    Creatinine, Ser  Date Value Ref Range Status  11/02/2021 1.19 (H) 0.44 - 1.00 mg/dL Final   Glucose  Date Value Ref Range Status  03/14/2015 155 mg/dL Final  01/25/2014 229 (H) 65 - 99 mg/dL Final   Glucose, Bld  Date Value Ref Range Status  11/02/2021 198 (H) 70 - 99 mg/dL Final    Comment:    Glucose reference range applies only to samples taken after fasting for at least 8 hours.   Glucose-Capillary  Date Value Ref Range Status  07/05/2015 138 (H) 65 - 99 mg/dL Final   Potassium  Date Value Ref Range Status  11/02/2021 4.0 3.5 - 5.1 mmol/L Final  09/14/2014 4.4 mmol/L Final   Sodium  Date Value Ref Range Status  11/02/2021 135 135 - 145 mmol/L Final  01/31/2021 137 134 - 144 mmol/L Final  09/14/2014 139  Final   Total Bilirubin  Date Value Ref Range Status  11/02/2021 0.6 0.3 - 1.2 mg/dL Final   Bilirubin,Total  Date Value Ref Range Status  01/25/2014 0.3 0.2 -  1.0 mg/dL Final   Bilirubin Total  Date Value Ref Range Status  01/31/2021 <0.2 0.0 - 1.2 mg/dL Final   Bilirubin, Direct  Date Value Ref Range Status  02/20/2016 0.07 0.00 - 0.40 mg/dL Final   Protein, ur  Date Value Ref Range Status  04/16/2021 30 (A) NEGATIVE mg/dL Final   Total Protein  Date Value Ref Range Status  11/02/2021 7.8 6.5 - 8.1 g/dL Final  01/31/2021 7.8 6.0 - 8.5 g/dL Final  01/25/2014 8.3 (H) 6.4 - 8.2 g/dL Final   GFR, Est African American  Date Value Ref Range Status  10/18/2017 42 (L) > OR = 60 mL/min/1.20m Final   GFR calc Af Amer  Date Value Ref Range Status  04/26/2020 39 (L) >59 mL/min/1.73 Final    Comment:    **[facility] currently reports eGFR in compliance with the current**   recommendations of the [employer] [facility] will   update reporting as new guidelines are published from the NKF-ASN   Task force.    eGFR  Date Value Ref Range Status  01/31/2021 38 (L) >59 mL/min/1.73 Final   GFR, Est Non African American  Date Value Ref Range Status  10/18/2017 36 (L) > OR = 60 mL/min/1.740mFinal   GFR, Estimated  Date Value Ref Range Status  11/02/2021 45 (L) >60 mL/min Final    Comment:    (NOTE) Calculated using the CKD-EPI Creatinine Equation (2021)

## 2022-05-22 ENCOUNTER — Other Ambulatory Visit: Payer: Self-pay | Admitting: Family Medicine

## 2022-05-22 NOTE — Telephone Encounter (Signed)
Requested medication (s) are due for refill today -expired Rx  Requested medication (s) are on the active medication list -yes  Future visit scheduled -no  Last refill: 10/18/20 #180 1RF  Notes to clinic: non delegated Rx- expired Rx  Requested Prescriptions  Pending Prescriptions Disp Refills   levETIRAcetam (KEPPRA) 500 MG tablet [Pharmacy Med Name: levETIRAcetam 500 MG Oral Tablet] 30 tablet 0    Sig: Take 1 tablet by mouth twice daily     Neurology:  Anticonvulsants - levetiracetam Failed - 05/22/2022  2:57 PM      Failed - Cr in normal range and within 360 days    Creat  Date Value Ref Range Status  10/18/2017 1.37 (H) 0.60 - 0.88 mg/dL Final    Comment:    For patients >46 years of age, the reference limit for Creatinine is approximately 13% higher for people identified as African-American. .    Creatinine, Ser  Date Value Ref Range Status  11/02/2021 1.19 (H) 0.44 - 1.00 mg/dL Final         Failed - Completed PHQ-2 or PHQ-9 in the last 360 days      Passed - Valid encounter within last 12 months    Recent Outpatient Visits           9 months ago Controlled diabetes mellitus with nephropathy [facility])   [facility] Birdie Sons, MD   1 year ago Frequent falls   [facility] Birdie Sons, MD   1 year ago Controlled diabetes mellitus with nephropathy [facility])   [facility] Birdie Sons, MD   2 years ago Other fatigue   [facility] Birdie Sons, MD   2 years ago Hemorrhoids, unspecified hemorrhoid type   [facility], Adriana M, [place]                 Requested Prescriptions  Pending Prescriptions Disp Refills   levETIRAcetam (KEPPRA) 500 MG tablet [Pharmacy Med Name: levETIRAcetam 500 MG Oral Tablet] 30 tablet 0    Sig: Take 1 tablet by mouth twice daily     Neurology:  Anticonvulsants - levetiracetam Failed - 05/22/2022  2:57 PM      Failed - Cr in normal range  and within 360 days    Creat  Date Value Ref Range Status  10/18/2017 1.37 (H) 0.60 - 0.88 mg/dL Final    Comment:    For patients >29 years of age, the reference limit for Creatinine is approximately 13% higher for people identified as African-American. .    Creatinine, Ser  Date Value Ref Range Status  11/02/2021 1.19 (H) 0.44 - 1.00 mg/dL Final         Failed - Completed PHQ-2 or PHQ-9 in the last 360 days      Passed - Valid encounter within last 12 months    Recent Outpatient Visits           9 months ago Controlled diabetes mellitus with nephropathy [facility])   [facility] Birdie Sons, MD   1 year ago Frequent falls   [facility] Birdie Sons, MD   1 year ago Controlled diabetes mellitus with nephropathy [facility])   [facility] Birdie Sons, MD   2 years ago Other fatigue   [facility] Birdie Sons, MD   2 years ago Hemorrhoids, unspecified hemorrhoid type   [place], Wendee Beavers, [place]

## 2022-06-03 ENCOUNTER — Other Ambulatory Visit: Payer: Self-pay | Admitting: Family Medicine

## 2022-06-03 DIAGNOSIS — F0280 Dementia in other diseases classified elsewhere without behavioral disturbance: Secondary | ICD-10-CM

## 2022-06-10 IMAGING — CT CT CERVICAL SPINE W/O CM
5 of 8 series · 15 of 33 positions shown, 16 images · non-contrast
Comparison: None.

CLINICAL DATA: Mechanical fall.  Hit head on ground.

EXAM:
CT CERVICAL SPINE WITHOUT CONTRAST
TECHNIQUE: Multidetector CT imaging of the cervical spine was performed without
intravenous contrast. Multiplanar CT image reconstructions were also
generated.

[Series 4: c spine soft · axial · 0.32mm/px · z∈[-179,-125]mm · 2 of 81 slices shown]
[im 27/81  soft-tissue]
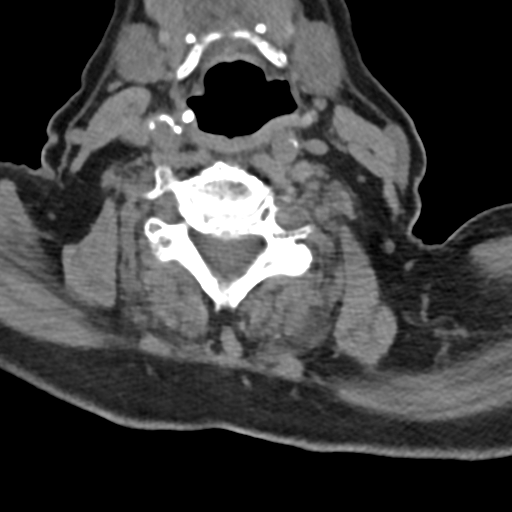
[im 54/81  soft-tissue]
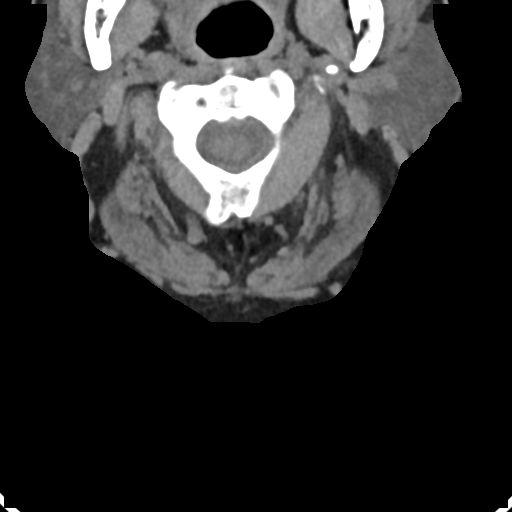

[Series 5: sagittal bone · sagittal · 0.37mm/px · 5 of 74 slices shown]
[im 11/74  bone]
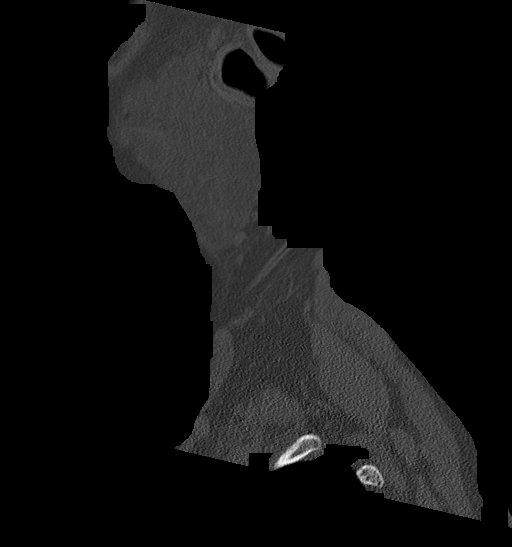
[im 21/74  bone]
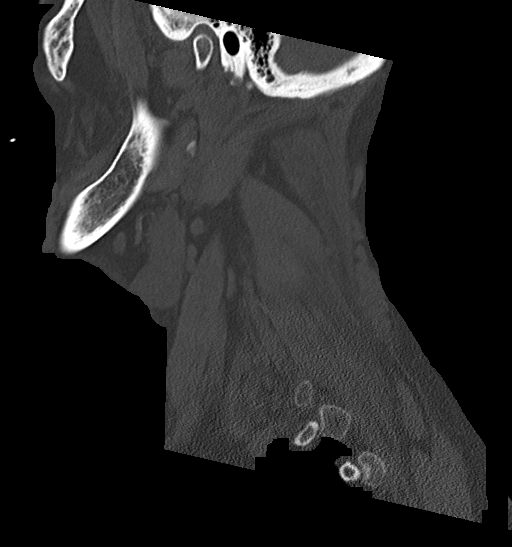
[im 32/74  bone]
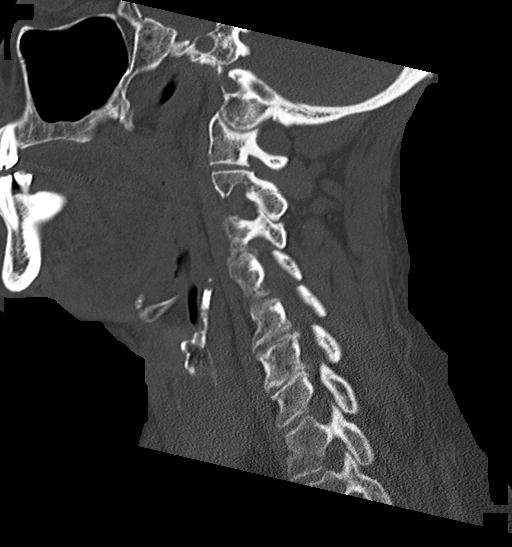
[im 42/74  bone]
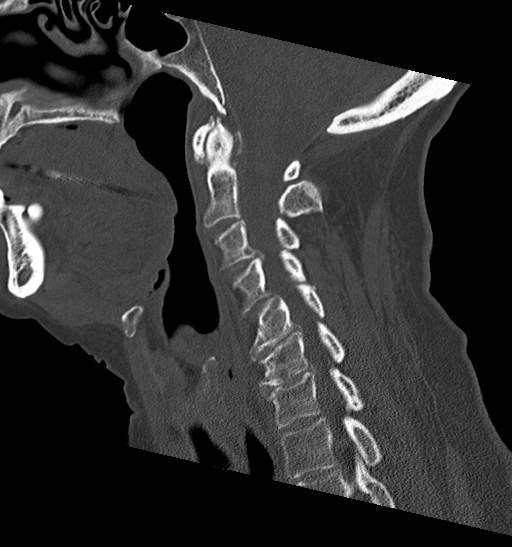
[im 53/74  bone]
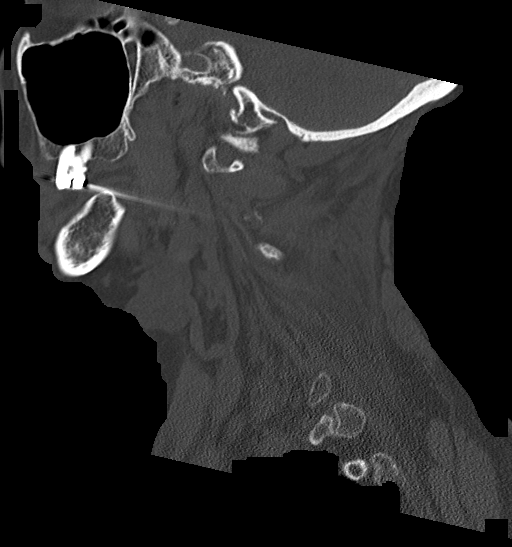

[Series 7: orthogonal bone · axial · 0.29mm/px · z∈[-233,-134]mm · 3 of 103 slices shown, 4 images]
[im 26/103  soft-tissue]
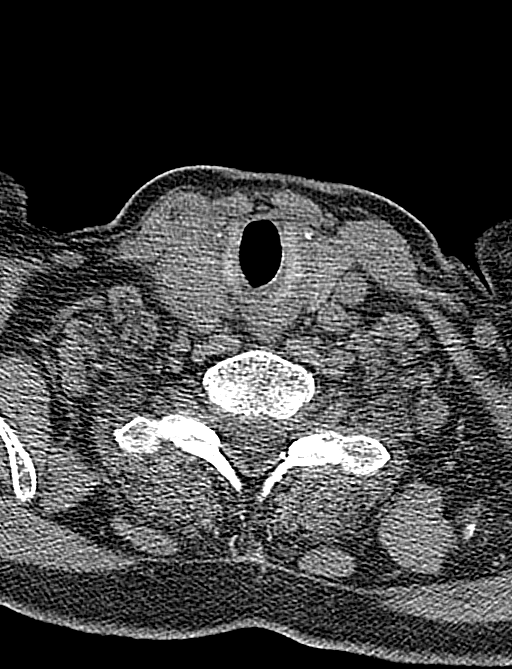
[im 26/103  bone]
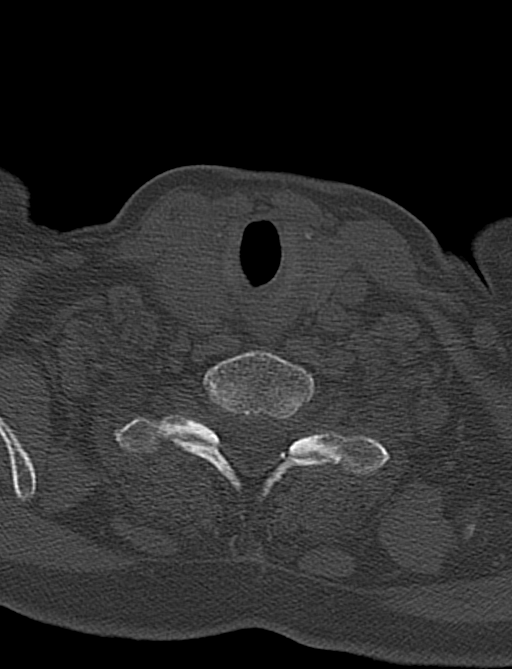
[im 52/103  bone]
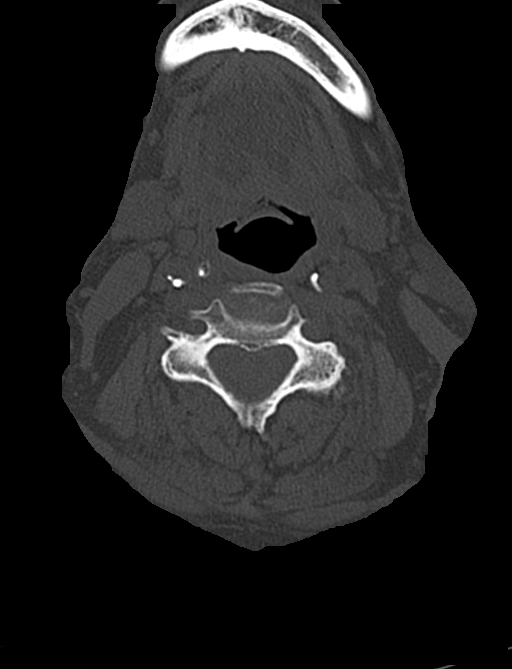
[im 77/103  bone]
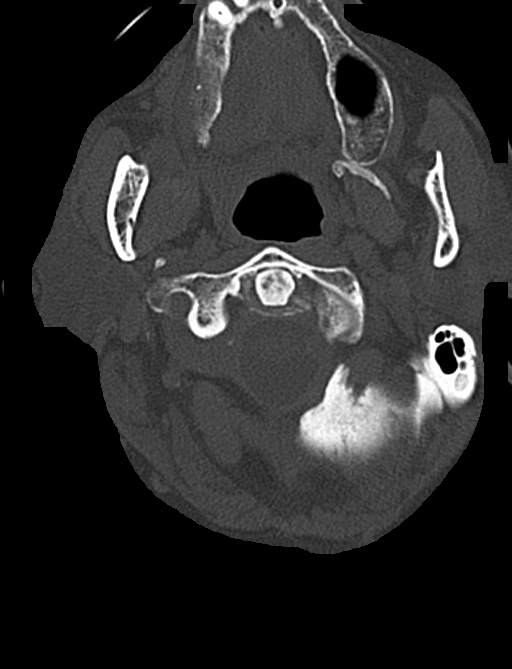

[Series 10: head bone · axial · 0.51mm/px · z∈[-38,+14]mm · 2 of 80 slices shown]
[im 27/80  bone]
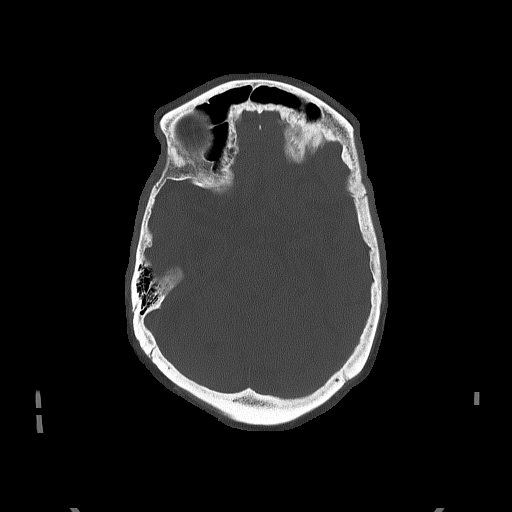
[im 53/80  bone]
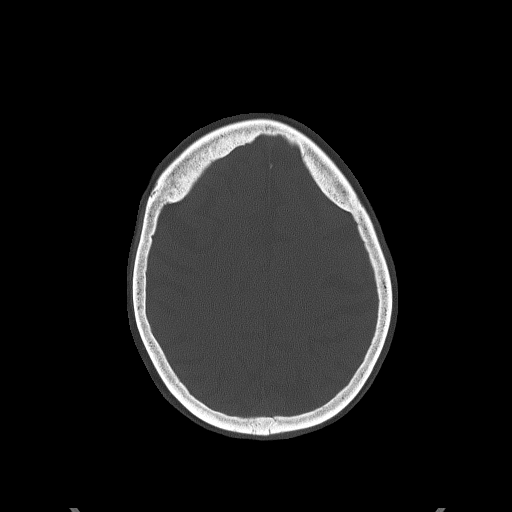

[Series 11: coronal soft tissue · coronal · 0.30mm/px · 3 of 65 slices shown]
[im 17/65  bone]
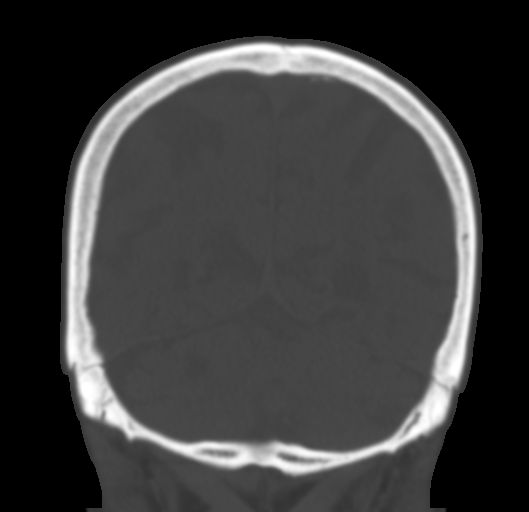
[im 33/65  bone]
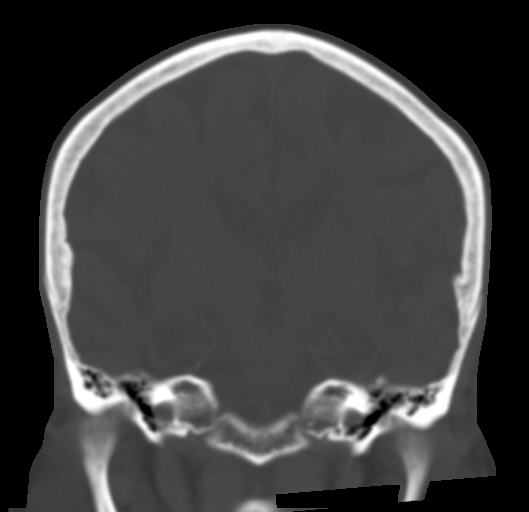
[im 49/65  bone]
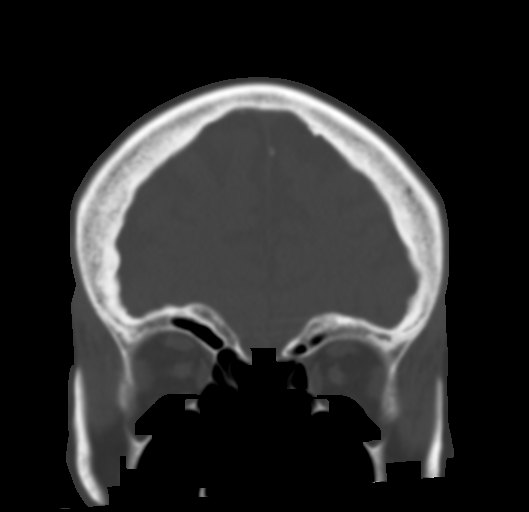

[15 of 33 positions shown; findings below may reference images not displayed]

FINDINGS: Alignment: Slight anterolisthesis present at C4-5. No other
significant listhesis is present. Straightening of the normal
cervical lordosis is present.

Skull base and vertebrae: Craniocervical junction is within normal
limits. Vertebral body heights are maintained. No acute fractures
are present.

Soft tissues and spinal canal: No prevertebral fluid or swelling. No
visible canal hematoma. Atherosclerotic changes are present at the
carotid bifurcations bilaterally. Thyroid is heterogeneous without a
dominant nodule. No significant cervical adenopathy is present.

Disc levels: Disc disease scratched at chronic loss of disc height
noted at C5-6 and C6-7 moderate foraminal narrowing is present
bilaterally at both levels. Asymmetric left-sided facet hypertrophy
contributes to left foraminal narrowing at C3-4 and C4-5.

Upper chest: The lung apices are clear. Thoracic inlet is within
normal limits.
IMPRESSION: 1. No acute fracture or traumatic subluxation.
2. Multilevel degenerative changes of the cervical spine as
described.

## 2022-06-17 ENCOUNTER — Telehealth: Payer: Self-pay | Admitting: Family Medicine

## 2022-06-17 DIAGNOSIS — F0280 Dementia in other diseases classified elsewhere without behavioral disturbance: Secondary | ICD-10-CM

## 2022-06-17 NOTE — Telephone Encounter (Signed)
Briana Austin called from [place] called to report that the pt has improved and no longer seems fit for hospice   Best contact: [phone]  Wants to know if PCP will order palliative care

## 2022-06-18 ENCOUNTER — Other Ambulatory Visit: Payer: Self-pay | Admitting: Family Medicine

## 2022-06-23 ENCOUNTER — Telehealth: Payer: Self-pay | Admitting: Family Medicine

## 2022-06-23 DIAGNOSIS — Z515 Encounter for palliative care: Secondary | ICD-10-CM | POA: Insufficient documentation

## 2022-06-23 NOTE — Telephone Encounter (Signed)
Lorriane Shire called from [facility] health and states that patient will be getting discharged this week and would like to know if Dr. Caryn Section had a follow up care plan for patient. Please advise Lorriane Shire at [phone].

## 2022-06-23 NOTE — Telephone Encounter (Signed)
She is being referred to palliative care

## 2022-06-24 NOTE — Telephone Encounter (Signed)
Vanessa informed.

## 2022-07-03 ENCOUNTER — Other Ambulatory Visit: Payer: Self-pay | Admitting: Family Medicine

## 2022-07-03 NOTE — Telephone Encounter (Signed)
Pt daughter reports that pt is just returning home from hospice and needs her Rx refilled. Per pt daughter pt takes 2-3 of the pills daily.  Medication Refill - Medication: traZODone (DESYREL) 50 MG tablet  Has the patient contacted their pharmacy? Yes.   Pt told to contact provider.   Preferred Pharmacy (with phone number or street name):  [facility] [place] - [address] Phone:  [phone]  Fax:  [phone]     Has the patient been seen for an appointment in the last year OR does the patient have an upcoming appointment? No.  Agent: Please be advised that RX refills may take up to 3 business days. We ask that you follow-up with your pharmacy.

## 2022-07-07 MED ORDER — TRAZODONE HCL 50 MG PO TABS: 75.0000 mg | ORAL_TABLET | Freq: Every day | ORAL | 3 refills | Status: DC

## 2022-07-07 NOTE — Telephone Encounter (Signed)
Requested medication (s) are due for refill today: Amount not specified  Requested medication (s) are on the active medication list: yes    Last refill: 08/15/21 Amount not specified  Future visit scheduled no  Notes to clinic:Historical provider. Pt is in Palliative Care, please review.  Per agent:  "Pt's daughter reports that pt is just returning home from hospice and needs her Rx refilled. Per pt daughter pt takes 2-3 of the pills daily."     Requested Prescriptions  Pending Prescriptions Disp Refills   traZODone (DESYREL) 50 MG tablet      Sig: Take 1.5-2 tablets (75-100 mg total) by mouth at bedtime.     Psychiatry: Antidepressants - Serotonin Modulator Failed - 07/03/2022 12:12 PM      Failed - Valid encounter within last 6 months    Recent Outpatient Visits           10 months ago Controlled diabetes mellitus with nephropathy [facility])   [facility] Birdie Sons, MD   1 year ago Frequent falls   [facility] Birdie Sons, MD   1 year ago Controlled diabetes mellitus with nephropathy [facility])   [facility] Birdie Sons, MD   2 years ago Other fatigue   [facility] Birdie Sons, MD   2 years ago Hemorrhoids, unspecified hemorrhoid type   [place], Wendee Beavers, [place]

## 2022-07-07 NOTE — Telephone Encounter (Signed)
Pts daughter is calling to check on the status of medication refill.

## 2022-07-23 ENCOUNTER — Other Ambulatory Visit: Payer: Medicare HMO | Admitting: Nurse Practitioner

## 2022-07-23 ENCOUNTER — Encounter: Payer: Self-pay | Admitting: Nurse Practitioner

## 2022-07-23 DIAGNOSIS — R630 Anorexia: Secondary | ICD-10-CM

## 2022-07-23 DIAGNOSIS — F03911 Unspecified dementia, unspecified severity, with agitation: Secondary | ICD-10-CM

## 2022-07-23 DIAGNOSIS — F0280 Dementia in other diseases classified elsewhere without behavioral disturbance: Secondary | ICD-10-CM

## 2022-07-23 DIAGNOSIS — Z515 Encounter for palliative care: Secondary | ICD-10-CM

## 2022-07-23 NOTE — Progress Notes (Signed)
[facility] Consult Note Telephone: [phone]  Fax: [phone]   Date of encounter: 07/23/22 2:26 PM PATIENT NAME: Briana Austin [address] [place] [address]   [phone] (home)  DOB: Dec 17, 1933 MRN: [id number] PRIMARY CARE PROVIDER:    Birdie Sons, MD,  [address] [place] [address] [phone]  REFERRING PROVIDER:   Birdie Sons, [place],  [place] [address] [phone]  RESPONSIBLE PARTY:    Contact Information     Name Relation Home Work [place] Daughter [phone]     Chancie, Lampert [phone]        Due to the COVID-19 crisis, this visit was done via telemedicine from my office and it was initiated and consent by this patient and or family.  I connected with daughter Briana Austin with  VICTORYA HILLMAN OR PROXY on 07/23/22 by a video enabled telemedicine application and verified that I am speaking with the correct person.   I discussed the limitations of evaluation and management by telemedicine. The patient expressed understanding and agreed to proceed. Palliative Care was asked to follow this patient by consultation request of  Fisher, Kirstie Peri, MD to address advance care planning and complex medical decision making. This is the initial visit.                            ASSESSMENT AND PLAN / RECOMMENDATIONS:   Symptom Management/Plan: Symptom Management/Plan: 1. Advance Care Planning;   Previously under hospice services, d/c due to stability, will re-visit when declines; ongoing discussions of goc  2. Goals of Care: Goals include to maximize quality of life and symptom management. Our advance care planning conversation included a discussion about:    The value and importance of advance care planning  Exploration of personal, cultural or spiritual beliefs that might influence medical decisions  Exploration of goals of care in  the event of a sudden injury or illness  Identification and preparation of a healthcare agent  Review and updating or creation of an advance directive document.  3. Agitation secondary to Alzheimer's dementia, stable, discussed at length, educated Briana Austin about Lorazepam should be used infrequently, should she find the nurse is giving it more than 1 to 2 times a day to let Dr Caryn Section or [facility] NP know and will need to adjust the seroquel to 67m qhs and/or add am dose of seroquel. We talked about effects of medications, medications reviewed. Continue 24 hr private caregivers. We talked about chronic disease, overall decline, debility. Discussed medical goals, f/u visit, scheduled. Support provided.   4. Anorexia secondary to Alzheimer's dementia, stable, continue to monitor % intake, measurements, discussed and reviewed nutrition.   5. Palliative care encounter; Palliative care encounter; Palliative medicine team will continue to support patient, patient's family, and medical team. Visit consisted of counseling and education dealing with the complex and emotionally intense issues of symptom management and palliative care in the setting of serious and potentially life-threatening illness  Follow up Palliative Care Visit: Palliative care will continue to follow for complex medical decision making, advance care planning, and clarification of goals. Return 8 weeks or prn for PC NP, [facility]  I spent 33 minutes providing this consultation. More than 50% of the time in this consultation was spent in counseling and care coordination. PPS: 30% Chief Complaint: Initial palliative consult for complex medical  decision making, address goals, manage ongoing symptoms  HISTORY OF PRESENT ILLNESS:  Briana Austin is a 88 y.o. year old female  with multiple medical problems including Alzheimer's dementia, TIA, HTN, seizure disorder, HLD, vitamin D deficiency, onychomycosis. I connected by video with Briana Kelp Ms.  Ronk's daughter, We talked about purpose of pc visit, as a consulting provider. We talked about recent d/c from hospice services due to stability. We talked about past medical history, social/family, functional, cognitive abilities, ros, medical goals, poc, f/u pc visit. We talked extensively about medications, symptoms of agitation. We talked about difference from hospice and [facility] medicine. Scheduled f/u visit. Briana Austin endorses she had 33 in home caregivers for her mother. No current needs, stable. Questions answered. Contact information provided.   History obtained from review of EMR, discussion with daughter, Briana Austin with Ms. Kitamura.  I reviewed available labs, medications, imaging, studies and related documents from the EMR.  Records reviewed and summarized above.   ROS 10 point system reviewed all negative except HPI  Physical Exam: deferred CURRENT PROBLEM LIST:  Patient Active Problem List   Diagnosis Date Noted   Palliative care patient 06/23/2022   Pain due to onychomycosis of toenails of both feet 05/13/2020   Urinary symptom or sign 02/15/2020   Skin irritation 01/22/2020   Vaginal irritation 01/22/2020   Yeast infection of the skin 01/22/2020   Seizures ([place])    Euthyroid sick syndrome    Postictal paralysis ([place])    Dementia in Alzheimer's disease ([place])    Essential hypertension    Facial droop 07/03/2015   TIA (transient ischemic attack)    Controlled diabetes mellitus with nephropathy ([place]) 03/25/2015   Hyperlipidemia 03/25/2015   Normocytic anemia 03/25/2015   Physical deconditioning 03/25/2015   Vitamin D deficiency 11/17/2010   PAST MEDICAL HISTORY:  Active Ambulatory Problems    Diagnosis Date Noted   Controlled diabetes mellitus with nephropathy ([place]) 03/25/2015   Hyperlipidemia 03/25/2015   Normocytic anemia 03/25/2015   Physical deconditioning 03/25/2015   Vitamin D deficiency 11/17/2010   Facial droop 07/03/2015   TIA (transient ischemic attack)     Postictal paralysis ([place])    Dementia in Alzheimer's disease ([place])    Essential hypertension    Seizures ([place])    Euthyroid sick syndrome    Skin irritation 01/22/2020   Vaginal irritation 01/22/2020   Yeast infection of the skin 01/22/2020   Urinary symptom or sign 02/15/2020   Pain due to onychomycosis of toenails of both feet 05/13/2020   Palliative care patient 06/23/2022   Resolved Ambulatory Problems    Diagnosis Date Noted   Abnormal loss of weight 03/25/2015   Contusion of breast, left 03/25/2015   Contusion of left wrist 03/25/2015   Decreased appetite 03/25/2015   History of breast cancer 03/25/2015   Pain in lateral right lower extremity 03/25/2015   Fever 06/08/2015   CAP (community acquired pneumonia) 06/14/2015   Tonic-clonic seizures ([place])    Past Medical History:  Diagnosis Date   Breast cancer ([place]) 2005   Dementia ([place])    Diabetes mellitus without complication ([place])    Hypertension    Personal history of radiation therapy    Seizure ([place])    SOCIAL HX:  Social History   Tobacco Use   Smoking status: Former    Packs/day: 0.50    Years: 8.00    Total pack years: 4.00    Types: Cigarettes   Smokeless tobacco: Never   Tobacco comments:    >  50 years ago  Substance Use Topics   Alcohol use: No    Alcohol/week: 0.0 standard drinks of alcohol   FAMILY HX:  Family History  Problem Relation Age of Onset   Stroke Mother    Dementia Brother    Breast cancer Neg Hx       ALLERGIES: No Known Allergies   PERTINENT MEDICATIONS:  Outpatient Encounter Medications as of 07/23/2022  Medication Sig   Accu-Chek Softclix Lancets lancets USE AS DIRECTED   ALPRAZolam (XANAX) 0.5 MG tablet Take 0.5-1 tablets (0.25-0.5 mg total) by mouth at bedtime.   aspirin EC 81 MG tablet Take 81 mg by mouth daily.   Blood Glucose Monitoring Suppl (ACCU-CHEK GUIDE ME) w/Device KIT Use to check blood sugar daily for type 2 diabetes E11.9   calcium carbonate (OS-CAL) 600 MG  TABS tablet Take 1 tablet by mouth daily.   Cholecalciferol (VITAMIN D3) 1.25 MG (50000 UT) CAPS Take 1 capsule by mouth once a week   ciprofloxacin (CILOXAN) 0.3 % ophthalmic solution Place 1 drop into affected eye every 4 (four) hours while awake for 7 days.   glipiZIDE (GLUCOTROL) 5 MG tablet Take 1 tablet (5 mg total) by mouth daily before breakfast.   glucose blood (ACCU-CHEK GUIDE) test strip Use to check blood sugar daily for type 2 diabetes E11.9. Dispense with #100 lancets   haloperidol (HALDOL) 1 MG tablet Take 1-2 tablets (1-2 mg total) by mouth every 6 (six) hours as needed for agitation.   levETIRAcetam (KEPPRA) 500 MG tablet Take 1 tablet by mouth twice daily   lisinopril (ZESTRIL) 10 MG tablet Take 1 tablet (10 mg total) by mouth daily.   LORazepam (ATIVAN) 0.5 MG tablet TAKE 1 TABLET BY MOUTH EVERY 4 HOURS AS NEEDED FOR  AGITATION   metFORMIN (GLUCOPHAGE) 500 MG tablet Take 1 tablet by mouth in the evening   montelukast (SINGULAIR) 10 MG tablet Take 1 tablet by mouth once daily   NAMZARIC 28-10 MG CP24 Take 1 capsule by mouth once daily   ONE TOUCH LANCETS MISC 1 Device by Does not apply route daily.   polyethylene glycol powder (GLYCOLAX/MIRALAX) 17 GM/SCOOP powder Take 17 g by mouth 2 (two) times daily as needed.   potassium chloride (KLOR-CON) 10 MEQ tablet Take 1 tablet by mouth once daily   pravastatin (PRAVACHOL) 40 MG tablet Take 1 tablet by mouth once daily   QUEtiapine (SEROQUEL) 25 MG tablet Take 1 tablet (25 mg total) by mouth 3 (three) times daily as needed.   sertraline (ZOLOFT) 50 MG tablet Take 25 mg by mouth daily.   traZODone (DESYREL) 50 MG tablet Take 1.5-2 tablets (75-100 mg total) by mouth at bedtime.   vitamin B-12 1000 MCG tablet Take 1 tablet (1,000 mcg total) by mouth daily.   Vitamin D, Ergocalciferol, (DRISDOL) 1.25 MG (50000 UT) CAPS capsule Take 1 capsule by mouth once a week   No facility-administered encounter medications on file as of 07/23/2022.    Thank you for the opportunity to participate in the care of Ms. Dunlap.  The palliative care team will continue to follow. Please call our office at [phone] if we can be of additional assistance.   Josph Norfleet Z Lailyn Appelbaum, NP ,

## 2022-08-02 ENCOUNTER — Other Ambulatory Visit: Payer: Self-pay | Admitting: Family Medicine

## 2022-08-02 DIAGNOSIS — E1121 Type 2 diabetes mellitus with diabetic nephropathy: Secondary | ICD-10-CM

## 2022-08-04 ENCOUNTER — Other Ambulatory Visit: Payer: Self-pay | Admitting: Family Medicine

## 2022-09-03 ENCOUNTER — Other Ambulatory Visit: Payer: Medicare HMO

## 2022-09-03 ENCOUNTER — Telehealth: Payer: Self-pay

## 2022-09-03 DIAGNOSIS — Z515 Encounter for palliative care: Secondary | ICD-10-CM

## 2022-09-03 NOTE — Telephone Encounter (Signed)
1030 am.  Follow up call completed at the request of Christin Gusler, NP.  Daughter has requested call back after 2 pm when caregiver is present.

## 2022-09-03 NOTE — Progress Notes (Signed)
PATIENT NAME: AMELDA HAPKE DOB: September 21, 1934 MRN: [id number]  PRIMARY CARE PROVIDER: Birdie Sons, MD  RESPONSIBLE PARTY:  Acct ID - Guarantor Home Phone Work Phone Relationship Acct Type  [id number] [id number]  Self P/F     [address], [place], [place] [address]     I connected with  Claretta Fraise for [name] on 09/03/22 by telephone and verified that I am speaking with the correct person using two identifiers.   I discussed the limitations of evaluation and management by telemedicine. The patient expressed understanding and agreed to proceed.   ACP:  DNR already in place per daughter.  Discussed MOST form and daughter would like to review with her father.  I will send a blank form to the home for review.   Agitation:  Patient is taking Seroquel 25 mg po bid.  Still has some agitation during the night, awakening around 2-3 am.  No combative behavior reported.  Daughter states patient often repeats, "oh lord" and "oh please".  She frequently does not want to be bothered.  Lorazepam is being used as needed.  Anorexia:  Daughter does not feel patient is any thinner but she appears frail.   She is currently in 2 meals (breakfast and supper) and will eat a light snack for lunch.  She is taking Ensure bid.    Syncopal Episode:  Daughter reports a syncopal episode recently where patient was unresponsive and slumped over in the chair.   Patient returned to baseline shortly after.  Family did not feel EMS needed to be contacted as there was nothing to be done other than keep patient comfortable and manage symptoms at home.   Update provided to Natalia Leatherwood, NP.   CODE STATUS: DNR ADVANCED DIRECTIVES: No MOST FORM: No PPS: 30%         Lorenza Burton, RN

## 2022-09-04 ENCOUNTER — Telehealth: Payer: Self-pay

## 2022-09-04 NOTE — Telephone Encounter (Signed)
900 am.  Incoming call from [employer].  New skin breakdown occurring on the buttocks.  Zinc Oxide and duoderm applied to area by aide.  Follow up visit scheduled for Monday at 1230 pm

## 2022-09-07 ENCOUNTER — Telehealth: Payer: Medicare HMO | Admitting: Nurse Practitioner

## 2022-09-07 ENCOUNTER — Other Ambulatory Visit: Payer: Medicare HMO

## 2022-09-07 VITALS — BP 118/68 | HR 61 | Temp 97.6°F

## 2022-09-07 DIAGNOSIS — Z515 Encounter for palliative care: Secondary | ICD-10-CM

## 2022-09-07 DIAGNOSIS — H01002 Unspecified blepharitis right lower eyelid: Secondary | ICD-10-CM

## 2022-09-07 DIAGNOSIS — F03911 Unspecified dementia, unspecified severity, with agitation: Secondary | ICD-10-CM

## 2022-09-07 DIAGNOSIS — F0280 Dementia in other diseases classified elsewhere without behavioral disturbance: Secondary | ICD-10-CM

## 2022-09-07 NOTE — Progress Notes (Signed)
[facility] Consult Note Telephone: [phone]  Fax: [phone]    Date of encounter: 09/07/22 5:45 PM PATIENT NAME: Briana Austin [id number]   [phone] (home)  DOB: [birth year] MRN: [id number] PRIMARY CARE PROVIDER:    Birdie Sons, MD,  [address] [place] [address] [phone] RESPONSIBLE PARTY:    Contact Information     Name Relation Home Work [place] Daughter [phone]     Briana Austin, Austin [phone]        Due to the COVID-19 crisis, this visit was done via telemedicine from my office and it was initiated and consent by this patient and or family.  I connected with Briana Austin, daughter with  Briana Austin OR PROXY on 09/07/22 by a video enabled telemedicine application and verified that I am speaking with the correct person using two identifiers.   I discussed the limitations of evaluation and management by telemedicine. The patient expressed understanding and agreed to proceed.  Palliative Care was asked to follow this patient by consultation request of  Austin, Briana Peri, MD to address advance care planning and complex medical decision making. This is a follow up visit.                                  ASSESSMENT AND PLAN / RECOMMENDATIONS:  Symptom Management/Plan: 1. Advance Care Planning;  DNR  PC NP to sign off, will have PC RN/PC SW continue to follow due to stability and program changes.   2. Goals of Care: Goals include to maximize quality of life and symptom management. Our advance care planning conversation included a discussion about:    The value and importance of advance care planning  Exploration of personal, cultural or spiritual beliefs that might influence medical decisions  Exploration of goals of care in the event of a sudden injury or illness  Identification and preparation of a healthcare agent  Review and updating or  creation of an advance directive document.  3. Dementia, progressive, discussed at length about seroquel. Discussed behaviors at length. Will increase seroquel to '25mg'$  q8 hrs prn, send rx in. Continue pureed diet, nutritional counseling discussed. No weight loss noted by family, or change in clothes size.   Rx: Seroquel '50mg'$  take 1/2 tablet q8hrs for increase in agitation, irritability; #60; 1RF  4. Increase in secretions; Discussed scolapamine patches will send in rx  Rx: Scopolamine patch place 1 q 72 hrs; #10; 6 RF 5. Conjunctivitis; will send in erythromycin ointment '5mg'$ /gm 1 small amount twice a day; #1; 1RF  By Briana Austin PC NP at home during telemedicine visit: Measurement:  right mid-arm circumference obtained today.  24.5 cm on right upper arm.  6. Palliative care encounter; Palliative care encounter; Palliative medicine team will continue to support patient, patient's family, and medical team. Visit consisted of counseling and education dealing with the complex and emotionally intense issues of symptom management and palliative care in the setting of serious and potentially life-threatening illness  Follow up Palliative Care Visit: Palliative care will continue to follow for complex medical decision making, advance care planning, and clarification of goals. Return 4 weeks by [facility] RN/PC SW, with the [facility] NP signing off due to stability and PC program change  I spent 36 minutes providing this consultation. More than 50% of the time in this consultation was spent  in counseling and care coordination.  PPS: 30%  Chief Complaint: Follow up palliative consult for complex medical decision making, address goals, manage ongoing symptoms  HISTORY OF PRESENT ILLNESS:  Briana Austin is a 88 y.o. year old female  with multiple medical problems including with multiple medical problems including Alzheimer's dementia, TIA, HTN, seizure disorder, HLD, vitamin D deficiency, onychomycosis. I connected by  video with Briana Austin PC RN with Briana Austin Briana Austin's daughter, and Briana Austin. We talked about purpose of pc visit, as a consulting provider. We talked about functional, cognitive abilities, ros, medical goals, poc, f/u pc visit. We talked extensively about medications, symptoms of agitation including seroquel. We talked about eye drainage, blephitis. We talked about erythromycin ointment. We talked about increase in clearing her throat, scolapaime patches. Rx sent in. We talked about wound, treatment. No recent falls, infections, hospitalizations. We talked about difference from hospice and [facility] medicine. Briana Austin endorses she had 51 in home caregivers for her mother. No current needs, stable. Questions answered. Contact information provided.  History obtained from review of EMR, discussion with primary team, and interview with family, facility staff/caregiver and/or Briana Austin. We talked about Briana Austin is stable, PC NP to sign off, will have PC RN/PC SW continue to follow due to stability and program changes.  I reviewed available labs, medications, imaging, studies and related documents from the EMR.  Records reviewed and summarized above.   ROS 10 point system reviewed, all negative except hpi  Physical Exam: deferred Thank you for the opportunity to participate in the care of Briana Austin.  The palliative care team will continue to follow. Please call our office at [phone] if we can be of additional assistance.   Briana Enwright Ihor Gully, NP

## 2022-09-07 NOTE — Progress Notes (Signed)
PATIENT NAME: Briana Austin DOB: Jan 16, 1934 MRN: [id number]  PRIMARY CARE PROVIDER: Birdie Sons, MD  RESPONSIBLE PARTY:  Acct ID - Guarantor Home Phone Work Phone Relationship Acct Type  [id number] YEZENIA, [phone]  Self P/F     [address], [place], [place] [address]  Connected virtually with Natalia Leatherwood, NP.  Dementia:  Patient is bed-bound, incontinent of bowel and bladder.  Able to answer simple questions.  Recognizes daughter, spouse and brother.  Repetitive in speech at times.  Often repeats "oh please" during this visit.  Eating well.  Able to feed herself a banana and can hold 8 ounces of water on her own.  Otherwise, family is feeding patient a pureed diet.  She is tolerating this well.   Occasional issues with agitation addressed by NP.  Measurement:  right mid-arm circumference obtained today.  24.5 cm on right upper arm.  Secretions:  Patient frequently clearing her throat.  NP discussed scoplamine patch.  Daughter will consider use.  Skin:  Stage 2 healing sacral wound.  No longer open.  Caregiver Otila Kluver had placed duoderm to site last week.  Now open to air with barrier cream.  Patient in being turned every 2 hours.    PHYSICAL EXAM:   VITALS: Today's Vitals   09/07/22 1207  BP: 118/68  Pulse: 61  Temp: 97.6 F (36.4 C)  SpO2: 98%     EXTREMITIES: - for edema SKIN: Skin color, texture, turgor normal. No rashes or lesions or Stage 1 to sacrum   NEURO: positive for gait problems, memory problems, and weakness       Lorenza Burton, RN

## 2022-09-14 ENCOUNTER — Telehealth: Payer: Self-pay

## 2022-09-14 NOTE — Telephone Encounter (Signed)
140 pm.  Return call made to daughter [name].  She has requested visit for Thursday be rescheduled as she will not be present.  She requested visit be rescheduled to next month as she will be out of town for a couple of weeks.  New appointment scheduled for next month.

## 2022-09-17 ENCOUNTER — Other Ambulatory Visit: Payer: Medicare HMO | Admitting: Nurse Practitioner

## 2022-09-21 ENCOUNTER — Other Ambulatory Visit: Payer: Self-pay | Admitting: Family Medicine

## 2022-10-05 ENCOUNTER — Telehealth: Payer: Self-pay | Admitting: Family Medicine

## 2022-10-05 MED ORDER — LORAZEPAM 1 MG PO TABS: ORAL_TABLET | ORAL | 2 refills | Status: DC

## 2022-10-05 NOTE — Telephone Encounter (Signed)
Pt daughter [name] requests that the Rx for LORazepam  be increased to 1 MG and sent to  [facility] [place] Phone: [phone]  Fax: [phone]

## 2022-10-05 NOTE — Telephone Encounter (Signed)
Please advise 

## 2022-10-09 ENCOUNTER — Ambulatory Visit: Payer: Self-pay

## 2022-10-09 DIAGNOSIS — K5904 Chronic idiopathic constipation: Secondary | ICD-10-CM

## 2022-10-09 MED ORDER — LACTULOSE 10 GM/15ML PO SOLN: ORAL | 3 refills | Status: DC

## 2022-10-09 NOTE — Addendum Note (Signed)
Addended by: Birdie Sons on: 10/09/2022 03:19 PM   Modules accepted: Orders

## 2022-10-09 NOTE — Telephone Encounter (Signed)
  Chief Complaint: medication refill  Symptoms: constipation, hard stools  Frequency: several days  Pertinent Negatives: NA Disposition: '[]'$ ED /'[]'$ Urgent Care (no appt availability in office) / '[]'$ Appointment(In office/virtual)/ '[]'$  [facility]/ '[]'$ Home Care/ '[]'$ Refused Recommended Disposition /'[]'$ [facility] Mobile [facility]/ '[x]'$  Follow-up with PCP Additional Notes: spoke with pt's husband. Pt had last dose of Constulose yesterday and helps with constipation for pt. Hospice was who prescribed for pt. Needing refill for it so pt can have BM.   Summary: constipation / constulose   Pt is in need of a refill for Constulose / pts spouse stated she is constipated / he stated she has been taking this RX for some time now / couldn't find medication on med list / please advise         Reason for Disposition  [1] Caller has NON-URGENT medicine question about med that PCP prescribed AND [2] triager unable to answer question  Answer Assessment - Initial Assessment Questions 1. DRUG NAME: "What medicine do you need to have refilled?"     constulose 2. REFILLS REMAINING: "How many refills are remaining?" (Note: The label on the medicine or pill bottle will show how many refills are remaining. If there are no refills remaining, then a renewal may be needed.)     0 4. PRESCRIBING HCP: "Who prescribed it?" Reason: If prescribed by specialist, call should be referred to that group.     hospice 5. SYMPTOMS: "Do you have any symptoms?"     Constipation, hard stools  Protocols used: Medication Refill and Renewal Call-A-AH

## 2022-10-09 NOTE — Telephone Encounter (Signed)
done

## 2022-10-14 ENCOUNTER — Telehealth: Payer: Self-pay

## 2022-10-14 ENCOUNTER — Other Ambulatory Visit: Payer: Medicare HMO

## 2022-10-14 VITALS — HR 67 | Temp 97.5°F

## 2022-10-14 DIAGNOSIS — Z515 Encounter for palliative care: Secondary | ICD-10-CM

## 2022-10-14 NOTE — Progress Notes (Signed)
PATIENT NAME: Briana Austin DOB: [birth year] MRN: [id number]  PRIMARY CARE PROVIDER: Birdie Sons, MD  RESPONSIBLE PARTY:  Acct ID - Guarantor Home Phone Work Phone Relationship Acct Type  [id number] Briana Austin, Briana [phone]  Self P/F     [address] [place],  [address]   Agitation:  Currently on seroquel 50 mg bid now.  Not seeing much change in behavior.  Discussed possible pain related.  Family is administering tylenol 500 mg in the am and Tylenol PM at night.  Patient seems to be doing well with this.  Morphine is still in the home when patient was under hospice.  Spouse is not interested in utilizing this.   Encouraged to continue with tylenol and if pain is not well managed may need to speak with PCP.     Dementia:  Continues with repetitive speech, "oh Lord, oh Jesus" most commonly repeated.   Able to hold finger foods to feed herself otherwise family continues to feed her.  Eating well and continues with nutritional supplement.  Incontinent of bowel and bladder.  No issues with constipation.  Mobility:  Family would like to get patient up to her wheelchair.  They are seeking a hoyer lift to assist with this.  I advised they would need to reach out to PCP and likely need a face to face visit for order to be sent in.  Daughter will contact office to see if a virtual visit can be completed.  Patient was last up in her wheelchair in October.  This required 2 person max assistance with transfer. Family reports patient had good trunk control but they did keep a safety belt on her in the event she tried to get up.  Patient sleeping soundly on this visit.  Was up at 8 am for bathing and breakfast.  Typically will go back to sleep until 1-2 pm and remains awake until 10 or 11 pm.  PPS: 30%   PHYSICAL EXAM:   VITALS: Today's Vitals   10/14/22 1113  Pulse: 67  Temp: (!) 97.5 F (36.4 C)  SpO2: 93%    LUNGS: clear to auscultation  CARDIAC: Cor RRR}  EXTREMITIES: not  assessed. SKIN: Skin color, texture, turgor normal. No rashes or lesions or normal  NEURO: positive for gait problems and memory problems       Lorenza Burton, RN

## 2022-10-14 NOTE — Telephone Encounter (Signed)
Copied from [place] [phone]. Topic: General - Other >> Oct 14, 2022 11:38 AM Ludger Nutting wrote: Patient's daughter is requesting a [employer] fast streamer for patient. Please follow up with with patient to get further information.

## 2022-10-16 NOTE — Telephone Encounter (Signed)
done

## 2022-10-19 ENCOUNTER — Other Ambulatory Visit: Payer: Self-pay | Admitting: Family Medicine

## 2022-10-20 NOTE — Telephone Encounter (Signed)
Returned call to [place]. Please advise how Harrel Lemon was ordered?

## 2022-10-20 NOTE — Telephone Encounter (Signed)
Daughter Briana Austin following up on request for a hoyer lift.  Please follow up w/ pt's daughter.

## 2022-10-20 NOTE — Telephone Encounter (Signed)
Requested medication (s) are due for refill today:   Yes  Requested medication (s) are on the active medication list:   Yes  Future visit scheduled:   No   Last ordered: 09/21/2022 #30, 0 refills  Returned for provider review for refills.   Pt. Now under palliative care.      Requested Prescriptions  Pending Prescriptions Disp Refills   traZODone (DESYREL) 50 MG tablet [Pharmacy Med Name: traZODone HCl 50 MG Oral Tablet] 30 tablet 0    Sig: TAKE ONE AND ONE-HALF TO TWO TABLETS BY MOUTH AT BEDTIME     Psychiatry: Antidepressants - Serotonin Modulator Failed - 10/19/2022  8:30 AM      Failed - Valid encounter within last 6 months    Recent Outpatient Visits           1 year ago Controlled diabetes mellitus with nephropathy [facility])   [facility] Birdie Sons, MD   1 year ago Frequent falls   [facility] Birdie Sons, MD   1 year ago Controlled diabetes mellitus with nephropathy [facility])   [facility] Birdie Sons, MD   2 years ago Other fatigue   [facility] Birdie Sons, MD   2 years ago Hemorrhoids, unspecified hemorrhoid type   [place], Wendee [place], [place]

## 2022-10-21 NOTE — Telephone Encounter (Signed)
I printed and signed order last week and left at my workstation to call and advised it was ready to be picked up

## 2022-10-23 ENCOUNTER — Other Ambulatory Visit: Payer: Self-pay | Admitting: *Deleted

## 2022-10-23 ENCOUNTER — Ambulatory Visit: Payer: Self-pay | Admitting: *Deleted

## 2022-10-23 ENCOUNTER — Telehealth: Payer: Self-pay | Admitting: Family Medicine

## 2022-10-23 MED ORDER — LORAZEPAM 1 MG PO TABS: ORAL_TABLET | ORAL | 1 refills | Status: DC

## 2022-10-23 NOTE — Telephone Encounter (Signed)
Briana Austin is also requesting that the quantity be increased. Please advise?

## 2022-10-23 NOTE — Telephone Encounter (Signed)
A user error has taken place: encounter opened in error, closed for administrative reasons.

## 2022-10-23 NOTE — Telephone Encounter (Signed)
Copied from [place] [id number]. Topic: General - Other >> Oct 22, 2022 11:28 AM Everette C wrote: Reason for CRM: The patient's daughter has called to follow up on their previous request for a hoyer lift for the patient   Please contact the patient's daughter further when possible

## 2022-10-23 NOTE — Telephone Encounter (Signed)
Requested medication (s) are due for refill today - no  Requested medication (s) are on the active medication list -yes  Future visit scheduled -no  Last refill: 10/05/22 #15 2RF  Notes to clinic: non delegated Rx, request PCP review for changes in amount - care giver feels patient needs to take more often and is running out to soon  Requested Prescriptions  Pending Prescriptions Disp Refills   LORazepam (ATIVAN) 1 MG tablet 15 tablet 2    Sig: TAKE 1 TABLET BY MOUTH EVERY 4 HOURS AS NEEDED FOR  AGITATION     Not Delegated - Psychiatry: Anxiolytics/Hypnotics 2 Failed - 10/23/2022 10:42 AM      Failed - This refill cannot be delegated      Failed - Urine Drug Screen completed in last 360 days      Failed - Valid encounter within last 6 months    Recent Outpatient Visits           1 year ago Controlled diabetes mellitus with nephropathy [facility])   [facility] Birdie Sons, MD   1 year ago Frequent falls   [facility] Birdie Sons, MD   1 year ago Controlled diabetes mellitus with nephropathy [facility])   [facility] Birdie Sons, MD   2 years ago Other fatigue   [facility] Birdie Sons, MD   2 years ago Hemorrhoids, unspecified hemorrhoid type   [place], Adriana M, [place]              Passed - Patient is not pregnant         Requested Prescriptions  Pending Prescriptions Disp Refills   LORazepam (ATIVAN) 1 MG tablet 15 tablet 2    Sig: TAKE 1 TABLET BY MOUTH EVERY 4 HOURS AS NEEDED FOR  AGITATION     Not Delegated - Psychiatry: Anxiolytics/Hypnotics 2 Failed - 10/23/2022 10:42 AM      Failed - This refill cannot be delegated      Failed - Urine Drug Screen completed in last 360 days      Failed - Valid encounter within last 6 months    Recent Outpatient Visits           1 year ago Controlled diabetes mellitus with nephropathy [facility])   [facility]  Birdie Sons, MD   1 year ago Frequent falls   [facility] Birdie Sons, MD   1 year ago Controlled diabetes mellitus with nephropathy [facility])   [facility] Birdie Sons, MD   2 years ago Other fatigue   [facility] Birdie Sons, MD   2 years ago Hemorrhoids, unspecified hemorrhoid type   [place], [place], [place]              Passed - Patient is not pregnant

## 2022-10-23 NOTE — Telephone Encounter (Signed)
Patient has not been using anti anxiety medication because only 15 pills were called in- they feel patient needs more as instructions are every 4 hours as needed.

## 2022-10-23 NOTE — Telephone Encounter (Signed)
Please advise 

## 2022-10-23 NOTE — Telephone Encounter (Signed)
Patients daughter [name] is calling back to check on the status on the patients hoyer lift.  Explained to daughter of the message below. She is wanting to know if she should come and pick the paper work up and is very frustrated and is wanting a call back.  Please advise  [name] phone number: [phone]

## 2022-10-23 NOTE — Telephone Encounter (Signed)
Daughter calling: Dementia patient Chief Complaint: possible mouth pain- agitation Symptoms: patient seemed to be pulling at teeth yesterday and moaning- better today Frequency: patient moans frequently- pulling at teeth was new Pertinent Negatives: Patient denies swelling in face, decreased intake today Disposition: '[]'$ ED /'[]'$ Urgent Care (no appt availability in office) / '[]'$ Appointment(In office/virtual)/ '[]'$  [place] [facility]/ '[]'$ Home Care/ '[x]'$ Refused Recommended Disposition /'[]'$ [place] [facility]/ '[]'$  Follow-up with PCP Additional Notes: Patient is dementia patient- advised ways to check for mouth pain- decreased intake, facial swelling, pain indicated with cleaning mouth with sponge. They will contiune to watch since patient is not presenting that behavior today. Request RF of  LORazepam (ATIVAN) 1 MG tablet - staff have not been using because there were only #15 supplied- requesting higher amount- patient needs to take more often for anxiuos behavior. Also would like follow up on lift requested.

## 2022-10-23 NOTE — Telephone Encounter (Addendum)
A user error has taken place: encounter opened in error, closed for administrative reasons.

## 2022-10-23 NOTE — Telephone Encounter (Signed)
Order was faxed ans pt's daughter was advised.

## 2022-10-23 NOTE — Telephone Encounter (Signed)
Summary: pain medication   Patients daughter Briana Austin is calling in regards to the patient having pain. Patients teeth is hurting and she is trying to pull her teeth out. Per Briana Austin patient has dementia and is wanting some pain medication called in for her.  Please advise       Reason for Disposition  All other mouth symptoms (Exceptions: dry mouth from not drinking enough liquids, chapped lips)    Patient seemed to be indicating she was having pain in her mouth yesterday- she is better today  Answer Assessment - Initial Assessment Questions 1. SYMPTOM: "What's the main symptom you're concerned about?" (e.g., chapped lips, dry mouth, lump, sores)     Patient is not cooperative- patient was taking towel and putting in mouth pulling at teeth- repeating get it out 2. ONSET: "When did the  mouth symptoms  start?"     yesterday 3. PAIN: "Is there any pain?" If Yes, ask: "How bad is it?" (Scale: 1-10; mild, moderate, severe)   - MILD (1-3):  doesn't interfere with eating or normal activities   - MODERATE (4-7): interferes with eating some solids and normal activities   - SEVERE (8-10):  excruciating pain, interferes with most normal activities   - SEVERE DYSPHAGIA: can't swallow liquids, drooling     Constant moaning- not sure if in pain- patient did not eat as much yesterday 4. CAUSE: "What do you think is causing the symptoms?"     Possible mouth pain 5. OTHER SYMPTOMS: "Do you have any other symptoms?" (e.g., fever, sore throat, toothache, swelling)     Hard to tell- patient seems better today  Protocols used: Mouth Symptoms-A-AH

## 2022-11-04 NOTE — Telephone Encounter (Signed)
Last office visit 08/15/21. Please advise.

## 2022-11-04 NOTE — Telephone Encounter (Addendum)
Daughter [name] calling to advise she called [facility] and along w/ the hoyer lift order, they need office visit notes and demographic info.  please call the daughter back asap  [phone]

## 2022-11-09 NOTE — Telephone Encounter (Signed)
Printed and placed on sorter to be faxed

## 2022-11-09 NOTE — Telephone Encounter (Signed)
Can send office notes from 08-15-2021

## 2022-11-10 ENCOUNTER — Other Ambulatory Visit: Payer: Medicare HMO

## 2022-11-10 ENCOUNTER — Other Ambulatory Visit: Payer: Self-pay

## 2022-11-10 VITALS — HR 72 | Temp 97.7°F

## 2022-11-10 DIAGNOSIS — Z515 Encounter for palliative care: Secondary | ICD-10-CM

## 2022-11-10 NOTE — Progress Notes (Signed)
PATIENT NAME: Briana Austin DOB: [birth year] MRN: [id number]  PRIMARY CARE PROVIDER: Birdie Sons, MD  RESPONSIBLE PARTY:  Acct ID - Guarantor Home Phone Work Phone Relationship Acct Type  [id number] CHAQUITA, [phone]  Self P/F     [address] [place], [place] [address]    Appetite:  Eating well per daughter.  Pureed diet with 3 meals a day and snacks. No coughing with meals reported by family.  Mid-arm circumference is 26 cm on the right arm.  Constipation:  receiving enemas almost weekly.  Daughter to add senna s to bowel regimen. Only taking lactulose as needed from private caregiver.  Education provided on the importance of maintaining a bowel regimen.   Dementia:  Currently asleep.  Family concerned with agitation in the afternoon and evenings.  Family was previously administering tylenol pm which was helpful in December.  Private live-in caregiver is back this month and daughter does not believe this is being given.  She will follow up today with caregiver.   Continues with seroquel and trazadone at hs.   Mobility:  Bed bound with no falls. Able to assist with some turning.  Family is expecting a hoyer lift that has been ordered from PCP office.   Last up in a wheelchair in August.  Family notes patient has good trunk control and they would like to get her up more often.  Daughter advised she would only do this when 2 caregivers were present to ensure safety with the hoyer lift.  Skin Breakdown:  Left buttock with stage 2 was previously healed but recently opened. Using zinc oxide routinely and turning/repositioning patient by family and private caregiver.   Resources:  Daughter and patient's spouse considering a home based provider.  I have referred them to [facility].  Spoke with Pecola Lawless at [employer] and demographics provided.  They will contact daughter to provide additional information on services and possibly schedule a home visit if daughter agrees.     CODE STATUS: DNR-uploaded to vynca ADVANCED DIRECTIVES: Not on file MOST FORM: No PPS: 30%   PHYSICAL EXAM:   VITALS: Today's Vitals   11/10/22 1256  Pulse: 72  Temp: 97.7 F (36.5 C)  SpO2: 95%    LUNGS: clear to auscultation  CARDIAC: Cor RRR}  EXTREMITIES: - for edema SKIN: Skin color, texture, turgor normal. No rashes or lesions, normal, or Stage 2 present to left buttock NEURO: positive for gait problems and memory problems       Lorenza Burton, RN

## 2022-11-18 ENCOUNTER — Other Ambulatory Visit: Payer: Self-pay | Admitting: Family Medicine

## 2022-11-18 DIAGNOSIS — F0280 Dementia in other diseases classified elsewhere without behavioral disturbance: Secondary | ICD-10-CM

## 2022-11-18 NOTE — Telephone Encounter (Signed)
Requested medication (s) are due for refill today: yes  Requested medication (s) are on the active medication list: yes  Last refill:  05/20/22  Future visit scheduled: yes  Notes to clinic:  Unable to refill per protocol, cannot delegate.      Requested Prescriptions  Pending Prescriptions Disp Refills   QUEtiapine (SEROQUEL) 25 MG tablet 45 tablet 0    Sig: Take 1 tablet (25 mg total) by mouth 3 (three) times daily as needed.     Not Delegated - Psychiatry:  Antipsychotics - Second Generation (Atypical) - quetiapine Failed - 11/18/2022  1:04 PM      Failed - This refill cannot be delegated      Failed - TSH in normal range and within 360 days    TSH  Date Value Ref Range Status  01/31/2021 1.170 0.450 - 4.500 uIU/mL Final         Failed - Valid encounter within last 6 months    Recent Outpatient Visits           1 year ago Controlled diabetes mellitus with nephropathy [facility])   [facility] Birdie Sons, MD   1 year ago Frequent falls   [facility] Birdie Sons, MD   1 year ago Controlled diabetes mellitus with nephropathy [facility])   [facility] Birdie Sons, MD   2 years ago Other fatigue   [facility] Birdie Sons, MD   2 years ago Hemorrhoids, unspecified hemorrhoid type   [place], Adriana M, PA-C              Failed - Lipid Panel in normal range within the last 12 months    Cholesterol, Total  Date Value Ref Range Status  04/26/2020 173 100 - 199 mg/dL Final  09/14/2014 187  Final   LDL Chol Calc (NIH)  Date Value Ref Range Status  04/26/2020 104 (H) 0 - 99 mg/dL Final   HDL  Date Value Ref Range Status  04/26/2020 45 >39 mg/dL Final   Triglycerides  Date Value Ref Range Status  04/26/2020 137 0 - 149 mg/dL Final  09/14/2014 132  Final         Failed - CBC within normal limits and completed in the last 12 months    WBC  Date Value Ref Range Status   11/02/2021 8.6 4.0 - 10.5 K/uL Final   RBC  Date Value Ref Range Status  11/02/2021 3.64 (L) 3.87 - 5.11 MIL/uL Final   Hemoglobin  Date Value Ref Range Status  11/02/2021 11.3 (L) 12.0 - 15.0 g/dL Final  01/31/2021 11.2 11.1 - 15.9 g/dL Final   HCT  Date Value Ref Range Status  11/02/2021 35.1 (L) 36.0 - 46.0 % Final   Hematocrit  Date Value Ref Range Status  01/31/2021 33.1 (L) 34.0 - 46.6 % Final   MCHC  Date Value Ref Range Status  11/02/2021 32.2 30.0 - 36.0 g/dL Final   [facility]  Date Value Ref Range Status  11/02/2021 31.0 26.0 - 34.0 pg Final   MCV  Date Value Ref Range Status  11/02/2021 96.4 80.0 - 100.0 fL Final  01/31/2021 93 79 - 97 fL Final  01/25/2014 98 80 - 100 fL Final   No results found for: "PLTCOUNTKUC", "LABPLAT", "POCPLA" RDW  Date Value Ref Range Status  11/02/2021 14.8 11.5 - 15.5 % Final  01/31/2021 12.6 11.7 - 15.4 % Final  01/25/2014 14.1 11.5 -  14.5 % Final         Failed - CMP within normal limits and completed in the last 12 months    Albumin  Date Value Ref Range Status  11/02/2021 3.7 3.5 - 5.0 g/dL Final  01/31/2021 4.3 3.6 - 4.6 g/dL Final  01/25/2014 3.6 3.4 - 5.0 g/dL Final   Alkaline Phosphatase  Date Value Ref Range Status  11/02/2021 60 38 - 126 U/L Final  01/25/2014 60 Unit/L Final    Comment:    45-117 NOTE: New Reference Range 09/22/13    Alkaline phosphatase (APISO)  Date Value Ref Range Status  10/18/2017 55 33 - 130 U/L Final   ALT  Date Value Ref Range Status  11/02/2021 24 0 - 44 U/L Final   SGPT (ALT)  Date Value Ref Range Status  01/25/2014 26 12 - 78 U/L Final   AST  Date Value Ref Range Status  11/02/2021 25 15 - 41 U/L Final   SGOT(AST)  Date Value Ref Range Status  01/25/2014 22 15 - 37 Unit/L Final   BUN  Date Value Ref Range Status  11/02/2021 14 8 - 23 mg/dL Final  01/31/2021 23 8 - 27 mg/dL Final  09/14/2014 20  Final   Calcium  Date Value Ref Range Status  11/02/2021 11.2 (H)  8.9 - 10.3 mg/dL Final   Calcium, Total  Date Value Ref Range Status  01/25/2014 10.0 8.5 - 10.1 mg/dL Final   Calcium, Ion  Date Value Ref Range Status  07/03/2015 1.27 1.13 - 1.30 mmol/L Final   CO2  Date Value Ref Range Status  11/02/2021 26 22 - 32 mmol/L Final   Co2  Date Value Ref Range Status  01/25/2014 24 21 - 32 mmol/L Final   TCO2  Date Value Ref Range Status  07/03/2015 19 0 - 100 mmol/L Final   Creat  Date Value Ref Range Status  10/18/2017 1.37 (H) 0.60 - 0.88 mg/dL Final    Comment:    For patients >58 years of age, the reference limit for Creatinine is approximately 13% higher for people identified as African-American. .    Creatinine, Ser  Date Value Ref Range Status  11/02/2021 1.19 (H) 0.44 - 1.00 mg/dL Final   Glucose  Date Value Ref Range Status  03/14/2015 155 mg/dL Final  01/25/2014 229 (H) 65 - 99 mg/dL Final   Glucose, Bld  Date Value Ref Range Status  11/02/2021 198 (H) 70 - 99 mg/dL Final    Comment:    Glucose reference range applies only to samples taken after fasting for at least 8 hours.   Glucose-Capillary  Date Value Ref Range Status  07/05/2015 138 (H) 65 - 99 mg/dL Final   Potassium  Date Value Ref Range Status  11/02/2021 4.0 3.5 - 5.1 mmol/L Final  09/14/2014 4.4 mmol/L Final   Sodium  Date Value Ref Range Status  11/02/2021 135 135 - 145 mmol/L Final  01/31/2021 137 134 - 144 mmol/L Final  09/14/2014 139  Final   Total Bilirubin  Date Value Ref Range Status  11/02/2021 0.6 0.3 - 1.2 mg/dL Final   Bilirubin,Total  Date Value Ref Range Status  01/25/2014 0.3 0.2 - 1.0 mg/dL Final   Bilirubin Total  Date Value Ref Range Status  01/31/2021 <0.2 0.0 - 1.2 mg/dL Final   Bilirubin, Direct  Date Value Ref Range Status  02/20/2016 0.07 0.00 - 0.40 mg/dL Final   Protein, ur  Date Value Ref Range Status  04/16/2021 30 (A) NEGATIVE mg/dL Final   Total Protein  Date Value Ref Range Status  11/02/2021 7.8 6.5  - 8.1 g/dL Final  01/31/2021 7.8 6.0 - 8.5 g/dL Final  01/25/2014 8.3 (H) 6.4 - 8.2 g/dL Final   GFR, Est African American  Date Value Ref Range Status  10/18/2017 42 (L) > OR = 60 mL/min/1.89m Final   GFR calc Af Amer  Date Value Ref Range Status  04/26/2020 39 (L) >59 mL/min/1.73 Final    Comment:    **[facility] currently reports eGFR in compliance with the current**   recommendations of the [employer] [facility] will   update reporting as new guidelines are published from the NKF-ASN   Task force.    eGFR  Date Value Ref Range Status  01/31/2021 38 (L) >59 mL/min/1.73 Final   GFR, Est Non African American  Date Value Ref Range Status  10/18/2017 36 (L) > OR = 60 mL/min/1.777mFinal   GFR, Estimated  Date Value Ref Range Status  11/02/2021 45 (L) >60 mL/min Final    Comment:    (NOTE) Calculated using the CKD-EPI Creatinine Equation (2021)          Passed - Last BP in normal range    BP Readings from Last 1 Encounters:  09/07/22 118/68         Passed - Last Heart Rate in normal range    Pulse Readings from Last 1 Encounters:  11/10/22 72

## 2022-11-18 NOTE — Telephone Encounter (Signed)
Pt's husband stated he called last week to request refill for QUEtiapine (SEROQUEL) 25 MG tablet   Needs this sent to [place] ([facility]), [place]  Pt has enough for one more dose / please advise when sent

## 2022-11-19 MED ORDER — QUETIAPINE FUMARATE 25 MG PO TABS: 25.0000 mg | ORAL_TABLET | Freq: Three times a day (TID) | ORAL | 2 refills | Status: DC | PRN

## 2022-11-19 NOTE — Telephone Encounter (Signed)
The patients daughter called in checking on the status of this refill request. Please let her know when it has been called in

## 2022-12-10 ENCOUNTER — Other Ambulatory Visit: Payer: Medicare HMO

## 2022-12-10 VITALS — BP 118/64 | HR 75 | Temp 97.6°F

## 2022-12-10 DIAGNOSIS — Z515 Encounter for palliative care: Secondary | ICD-10-CM

## 2022-12-10 NOTE — Progress Notes (Signed)
PATIENT NAME: Briana Austin DOB: 04/08/1934 MRN: [id number]  PRIMARY CARE PROVIDER: Birdie Sons, MD  RESPONSIBLE PARTY:  Acct ID - Guarantor Home Phone Work Phone Relationship Acct Type  [id number] DMYA, [phone]  Self P/F     [address], [place], [place] [address]   Home visit completed with patient, daughter and spouse.  Blood Sugars:  Granddaughter checked blood sugar a few days ago.  Reading was 215 but after eating.  Constipation:  Better managed now.  Family is administering lactulose every day to every other day.  BM's are more consistent and longer using enemas.   Dementia:  Patient is awake and engaging for short intervals.  Frequently calling out, "oh, oh, oh" but denies pain.  Chewing on her clothing frequently.  Daughter has started using a teething ring for patient to chew on.   Patient is total assist with adl's and is bed bound.  She is incontinent of bowel and bladder.   Resources:  Daughter advised she has not heard from [facility].  Secure message sent to Reche Dixon, SW with [employer] regarding referral.  She will follow up.  Dementia resource book provided to daughter.  CODE STATUS: DNR  ADVANCED DIRECTIVES: Not on file MOST FORM: No PPS: 30%   PHYSICAL EXAM:   VITALS: Today's Vitals   12/10/22 1222  BP: 118/64  Pulse: 75  Temp: 97.6 F (36.4 C)  SpO2: 95%    LUNGS: clear to auscultation  CARDIAC: Cor RRR}  EXTREMITIES: - for edema SKIN: Skin color, texture, turgor normal. No rashes or lesions or healing Stage 2 Left upper buttock   NEURO: positive for gait problems and memory problems       Lorenza Burton, RN

## 2022-12-21 ENCOUNTER — Telehealth: Payer: Self-pay

## 2022-12-21 NOTE — Telephone Encounter (Signed)
Return TC to daughter, [name]. Daughter requested contact number to Justin [phone].

## 2023-01-04 DIAGNOSIS — I10 Essential (primary) hypertension: Secondary | ICD-10-CM | POA: Diagnosis not present

## 2023-01-04 DIAGNOSIS — F03B0 Unspecified dementia, moderate, without behavioral disturbance, psychotic disturbance, mood disturbance, and anxiety: Secondary | ICD-10-CM | POA: Diagnosis not present

## 2023-01-04 DIAGNOSIS — R5381 Other malaise: Secondary | ICD-10-CM | POA: Diagnosis not present

## 2023-01-04 DIAGNOSIS — R54 Age-related physical debility: Secondary | ICD-10-CM | POA: Diagnosis not present

## 2023-01-04 DIAGNOSIS — Z7984 Long term (current) use of oral hypoglycemic drugs: Secondary | ICD-10-CM | POA: Diagnosis not present

## 2023-01-04 DIAGNOSIS — E119 Type 2 diabetes mellitus without complications: Secondary | ICD-10-CM | POA: Diagnosis not present

## 2023-01-04 DIAGNOSIS — Z7401 Bed confinement status: Secondary | ICD-10-CM | POA: Diagnosis not present

## 2023-01-07 ENCOUNTER — Other Ambulatory Visit: Payer: Medicare HMO

## 2023-01-07 VITALS — BP 136/78 | HR 67 | Temp 97.6°F

## 2023-01-07 DIAGNOSIS — Z515 Encounter for palliative care: Secondary | ICD-10-CM

## 2023-01-07 NOTE — Progress Notes (Signed)
PATIENT NAME: Briana Austin DOB: [birth year] MRN: [id number]  PRIMARY CARE PROVIDER: Birdie Sons, MD  RESPONSIBLE PARTY:  Acct ID - Guarantor Home Phone Work Phone Relationship Acct Type  [id number] AMYBETH, [phone]  Self P/F     [address], [place], [place] [address]   Home visit completed with patient, spouse and daughter [name].  Agitation:  Daughter reports agitation has increased over the last 3 days.  They attempted ativan 2x on Wednesday but this was not effective.  Patient frequently calling out, "oh, oh, oh".  Worsens with movement per daughter.  Discussed possible pain.  They have tried tylenol pm during the evenings but this has not been effective this week.   Larena Glassman, LPN with [employer] updated and will inform NP.   Constipation:  Patient's last bowel movement was on 01/03/23.  Lactulose was given 2 x but no results.  Daughter shared hired caregivers are not giving lactulose as directed and patient is requiring enemas.   Enforced importance of maintaining bowel regimen.  Follow up visit scheduled in 1 month.   CODE STATUS: DNR ADVANCED DIRECTIVES: Yes MOST FORM: No PPS: 30%   PHYSICAL EXAM:   VITALS: Today's Vitals   01/07/23 1207  BP: 136/78  Pulse: 67  Temp: 97.6 F (36.4 C)  SpO2: 92%    LUNGS: clear to auscultation  CARDIAC: Cor RRR}  EXTREMITIES: - for edema SKIN: Skin color, texture, turgor normal. No rashes or lesions or mobility and turgor normal  NEURO: positive for gait problems and memory problems       Lorenza Burton, RN

## 2023-01-10 ENCOUNTER — Other Ambulatory Visit: Payer: Self-pay | Admitting: Family Medicine

## 2023-01-11 NOTE — Telephone Encounter (Signed)
Requested medication (s) are due for refill today: yes  Requested medication (s) are on the active medication list: yes  Last refill:  10/20/22 #60 2 RF  Future visit scheduled: no  Notes to clinic:  not sure if palliative care is prescribing or Dr. Caryn Section   Requested Prescriptions  Pending Prescriptions Disp Refills   traZODone (DESYREL) 50 MG tablet [Pharmacy Med Name: traZODone HCl 50 MG Oral Tablet] 60 tablet 0    Sig: TAKE ONE AND ONE-HALF TO TWO TABLETS BY MOUTH AT BEDTIME     Psychiatry: Antidepressants - Serotonin Modulator Failed - 01/10/2023  7:50 AM      Failed - Valid encounter within last 6 months    Recent Outpatient Visits           1 year ago Controlled diabetes mellitus with nephropathy ([place])   [facility] Birdie Sons, MD   1 year ago Frequent falls   [facility] Birdie Sons, MD   1 year ago Controlled diabetes mellitus with nephropathy [facility])   [place], Donald E, MD   2 years ago Other fatigue   [place], Donald E, MD   2 years ago Hemorrhoids, unspecified hemorrhoid type   [facility] [place], Fabio Bering [place], [place]

## 2023-01-12 DIAGNOSIS — E119 Type 2 diabetes mellitus without complications: Secondary | ICD-10-CM | POA: Diagnosis not present

## 2023-01-12 DIAGNOSIS — E785 Hyperlipidemia, unspecified: Secondary | ICD-10-CM | POA: Diagnosis not present

## 2023-01-12 DIAGNOSIS — R54 Age-related physical debility: Secondary | ICD-10-CM | POA: Diagnosis not present

## 2023-01-12 DIAGNOSIS — I10 Essential (primary) hypertension: Secondary | ICD-10-CM | POA: Diagnosis not present

## 2023-01-12 DIAGNOSIS — F0390 Unspecified dementia without behavioral disturbance: Secondary | ICD-10-CM | POA: Diagnosis not present

## 2023-01-18 DIAGNOSIS — F0390 Unspecified dementia without behavioral disturbance: Secondary | ICD-10-CM | POA: Diagnosis not present

## 2023-01-18 DIAGNOSIS — Z7401 Bed confinement status: Secondary | ICD-10-CM | POA: Diagnosis not present

## 2023-01-18 DIAGNOSIS — R5381 Other malaise: Secondary | ICD-10-CM | POA: Diagnosis not present

## 2023-01-18 DIAGNOSIS — I10 Essential (primary) hypertension: Secondary | ICD-10-CM | POA: Diagnosis not present

## 2023-02-11 ENCOUNTER — Other Ambulatory Visit: Payer: Medicare HMO

## 2023-02-11 VITALS — BP 130/72 | HR 68 | Temp 97.6°F

## 2023-02-11 DIAGNOSIS — Z515 Encounter for palliative care: Secondary | ICD-10-CM

## 2023-02-11 NOTE — Progress Notes (Signed)
PATIENT NAME: SADIYAH THEILEN DOB: 21-Dec-1933 MRN: [id number]  PRIMARY CARE PROVIDER: Malva Limes, MD  RESPONSIBLE PARTY:  Acct ID - Guarantor Home Phone Work Phone Relationship Acct Type  [id number] TRAVONNA, KOSOWSKI* [phone]  Self P/F     [address], [place], [place] [address]    Home visit completed with patient and spouse.  Agitation: Spouse believes this has improved.  Taking Seroquel bid.  Spouse reports "good/bad days"  Typically sleeping until 1-3 pm and then will remain awake for a few hours.  Calling out has improved.   Constipation:  Has improved per spouse.  Patient consuming activa yogurt that is aiding in her bowels.  Calendar shows last bowel movement was Saturday but spouse believes patient has had one since then.  Dementia:  Bed bound.  Has not recently been out of the bed into her wheelchair.  Still holding finger foods when eating otherwise family feeds.  Appetite has been very good.    Skin:  No skin issues reported.   Follow up visit scheduled for next month.    CODE STATUS: DNR ADVANCED DIRECTIVES: Yes MOST FORM:  PPS: 30%   PHYSICAL EXAM:   VITALS: Today's Vitals   02/11/23 1209  BP: 130/72  Pulse: 68  Temp: 97.6 F (36.4 C)  SpO2: 97%    LUNGS: clear to auscultation  CARDIAC: Cor RRR}  EXTREMITIES: - for edema SKIN: Skin color, texture, turgor normal. No rashes or lesions or mobility and turgor normal  NEURO: positive for gait problems and memory problems       Truitt Merle, RN

## 2023-02-25 DIAGNOSIS — Z7401 Bed confinement status: Secondary | ICD-10-CM | POA: Diagnosis not present

## 2023-02-25 DIAGNOSIS — R5381 Other malaise: Secondary | ICD-10-CM | POA: Diagnosis not present

## 2023-02-25 DIAGNOSIS — F0390 Unspecified dementia without behavioral disturbance: Secondary | ICD-10-CM | POA: Diagnosis not present

## 2023-03-01 DIAGNOSIS — E119 Type 2 diabetes mellitus without complications: Secondary | ICD-10-CM | POA: Diagnosis not present

## 2023-03-01 DIAGNOSIS — F0390 Unspecified dementia without behavioral disturbance: Secondary | ICD-10-CM | POA: Diagnosis not present

## 2023-03-01 DIAGNOSIS — E785 Hyperlipidemia, unspecified: Secondary | ICD-10-CM | POA: Diagnosis not present

## 2023-03-01 DIAGNOSIS — R54 Age-related physical debility: Secondary | ICD-10-CM | POA: Diagnosis not present

## 2023-03-01 DIAGNOSIS — I10 Essential (primary) hypertension: Secondary | ICD-10-CM | POA: Diagnosis not present

## 2023-03-10 ENCOUNTER — Telehealth: Payer: Self-pay

## 2023-03-10 NOTE — Telephone Encounter (Signed)
1506 Palliative Care Note  RN called and spoke with pt's spouse. Apologized for need to cancel visit this week and explained that RN Manson Passey would call to reschedule. Voiced understanding.  Barbette Merino, RN

## 2023-03-11 ENCOUNTER — Other Ambulatory Visit: Payer: Medicare HMO

## 2023-04-21 ENCOUNTER — Other Ambulatory Visit: Payer: Self-pay | Admitting: Physician Assistant

## 2023-04-21 DIAGNOSIS — F0280 Dementia in other diseases classified elsewhere without behavioral disturbance: Secondary | ICD-10-CM

## 2023-04-22 NOTE — Telephone Encounter (Signed)
Requested medication (s) are due for refill today: yes  Requested medication (s) are on the active medication list: yes    Last refill: 11/19/22  #90  2 refills  Future visit scheduled no  Notes to clinic:Not delegated, please review. Thank you.  Requested Prescriptions  Pending Prescriptions Disp Refills   QUEtiapine (SEROQUEL) 25 MG tablet [Pharmacy Med Name: QUEtiapine Fumarate 25 MG Oral Tablet] 45 tablet 0    Sig: Take 1 tablet by mouth three times daily as needed     Not Delegated - Psychiatry:  Antipsychotics - Second Generation (Atypical) - quetiapine Failed - 04/21/2023  3:22 PM      Failed - This refill cannot be delegated      Failed - TSH in normal range and within 360 days    TSH  Date Value Ref Range Status  01/31/2021 1.170 0.450 - 4.500 uIU/mL Final         Failed - Valid encounter within last 6 months    Recent Outpatient Visits           1 year ago Controlled diabetes mellitus with nephropathy (HCC)   [facility] Malva Limes, MD   2 years ago Frequent falls   [facility] Malva [facility]imes, MD   2 years ago Controlled diabetes mellitus with nephropathy [facility])   [facility] Malva Limes, MD   2 years ago Other fatigue   [facility] Malva Limes, MD   3 years ago Hemorrhoids, unspecified hemorrhoid type   [facility] [place], [place] M, PA-C              Failed - Lipid Panel in normal range within the last 12 months    Cholesterol, Total  Date Value Ref Range Status  04/26/2020 173 100 - 199 mg/dL Final  16/08/9603 540  Final   LDL Chol Calc (NIH)  Date Value Ref Range Status  04/26/2020 104 (H) 0 - 99 mg/dL Final   HDL  Date Value Ref Range Status  04/26/2020 45 >39 mg/dL Final   Triglycerides  Date Value Ref Range Status  04/26/2020 137 0 - 149 mg/dL Final  98/09/9146 829  Final         Failed  - CBC within normal limits and completed in the last 12 months    WBC  Date Value Ref Range Status  11/02/2021 8.6 4.0 - 10.5 K/uL Final   RBC  Date Value Ref Range Status  11/02/2021 3.64 (L) 3.87 - 5.11 MIL/uL Final   Hemoglobin  Date Value Ref Range Status  11/02/2021 11.3 (L) 12.0 - 15.0 g/dL Final  56/21/3086 57.8 11.1 - 15.9 g/dL Final   HCT  Date Value Ref Range Status  11/02/2021 35.1 (L) 36.0 - 46.0 % Final   Hematocrit  Date Value Ref Range Status  01/31/2021 33.1 (L) 34.0 - 46.6 % Final   MCHC  Date Value Ref Range Status  11/02/2021 32.2 30.0 - 36.0 g/dL Final   [facility]  Date Value Ref Range Status  11/02/2021 31.0 26.0 - 34.0 pg Final   MCV  Date Value Ref Range Status  11/02/2021 96.4 80.0 - 100.0 fL Final  01/31/2021 93 79 - 97 fL Final  01/25/2014 98 80 - 100 fL Final   No results found for: "PLTCOUNTKUC", "LABPLAT", "POCPLA" RDW  Date Value Ref Range Status  11/02/2021 14.8 11.5 - 15.5 %  Final  01/31/2021 12.6 11.7 - 15.4 % Final  01/25/2014 14.1 11.5 - 14.5 % Final         Failed - CMP within normal limits and completed in the last 12 months    Albumin  Date Value Ref Range Status  11/02/2021 3.7 3.5 - 5.0 g/dL Final  40/98/1191 4.3 3.6 - 4.6 g/dL Final  47/82/9562 3.6 3.4 - 5.0 g/dL Final   Alkaline Phosphatase  Date Value Ref Range Status  11/02/2021 60 38 - 126 U/L Final  01/25/2014 60 Unit/L Final    Comment:    45-117 NOTE: New Reference Range 09/22/13    Alkaline phosphatase (APISO)  Date Value Ref Range Status  10/18/2017 55 33 - 130 U/L Final   ALT  Date Value Ref Range Status  11/02/2021 24 0 - 44 U/L Final   SGPT (ALT)  Date Value Ref Range Status  01/25/2014 26 12 - 78 U/L Final   AST  Date Value Ref Range Status  11/02/2021 25 15 - 41 U/L Final   SGOT(AST)  Date Value Ref Range Status  01/25/2014 22 15 - 37 Unit/L Final   BUN  Date Value Ref Range Status  11/02/2021 14 8 - 23 mg/dL Final  13/06/6577 23 8 -  27 mg/dL Final  46/96/2952 20  Final   Calcium  Date Value Ref Range Status  11/02/2021 11.2 (H) 8.9 - 10.3 mg/dL Final   Calcium, Total  Date Value Ref Range Status  01/25/2014 10.0 8.5 - 10.1 mg/dL Final   Calcium, Ion  Date Value Ref Range Status  07/03/2015 1.27 1.13 - 1.30 mmol/L Final   CO2  Date Value Ref Range Status  11/02/2021 26 22 - 32 mmol/L Final   Co2  Date Value Ref Range Status  01/25/2014 24 21 - 32 mmol/L Final   TCO2  Date Value Ref Range Status  07/03/2015 19 0 - 100 mmol/L Final   Creat  Date Value Ref Range Status  10/18/2017 1.37 (H) 0.60 - 0.88 mg/dL Final    Comment:    For patients >5 years of age, the reference limit for Creatinine is approximately 13% higher for people identified as African-American. .    Creatinine, Ser  Date Value Ref Range Status  11/02/2021 1.19 (H) 0.44 - 1.00 mg/dL Final   Glucose  Date Value Ref Range Status  03/14/2015 155 mg/dL Final  84/13/2440 102 (H) 65 - 99 mg/dL Final   Glucose, Bld  Date Value Ref Range Status  11/02/2021 198 (H) 70 - 99 mg/dL Final    Comment:    Glucose reference range applies only to samples taken after fasting for at least 8 hours.   Glucose-Capillary  Date Value Ref Range Status  07/05/2015 138 (H) 65 - 99 mg/dL Final   Potassium  Date Value Ref Range Status  11/02/2021 4.0 3.5 - 5.1 mmol/L Final  09/14/2014 4.4 mmol/L Final   Sodium  Date Value Ref Range Status  11/02/2021 135 135 - 145 mmol/L Final  01/31/2021 137 134 - 144 mmol/L Final  09/14/2014 139  Final   Total Bilirubin  Date Value Ref Range Status  11/02/2021 0.6 0.3 - 1.2 mg/dL Final   Bilirubin,Total  Date Value Ref Range Status  01/25/2014 0.3 0.2 - 1.0 mg/dL Final   Bilirubin Total  Date Value Ref Range Status  01/31/2021 <0.2 0.0 - 1.2 mg/dL Final   Bilirubin, Direct  Date Value Ref Range Status  02/20/2016 0.07 0.00 -  0.40 mg/dL Final   Protein, ur  Date Value Ref Range Status   04/16/2021 30 (A) NEGATIVE mg/dL Final   Total Protein  Date Value Ref Range Status  11/02/2021 7.8 6.5 - 8.1 g/dL Final  16/08/9603 7.8 6.0 - 8.5 g/dL Final  54/07/8118 8.3 (H) 6.4 - 8.2 g/dL Final   GFR, Est African American  Date Value Ref Range Status  10/18/2017 42 (L) > OR = 60 mL/min/1.86m2 Final   GFR calc Af Amer  Date Value Ref Range Status  04/26/2020 39 (L) >59 mL/min/1.73 Final    Comment:    **[facility] currently reports eGFR in compliance with the current**   recommendations of the [employer]. [facility] will   update reporting as new guidelines are published from the NKF-ASN   Task force.    eGFR  Date Value Ref Range Status  01/31/2021 38 (L) >59 mL/min/1.73 Final   GFR, Est Non African American  Date Value Ref Range Status  10/18/2017 36 (L) > OR = 60 mL/min/1.39m2 Final   GFR, Estimated  Date Value Ref Range Status  11/02/2021 45 (L) >60 mL/min Final    Comment:    (NOTE) Calculated using the CKD-EPI Creatinine Equation (2021)          Passed - Last BP in normal range    BP Readings from Last 1 Encounters:  02/11/23 130/72         Passed - Last Heart Rate in normal range    Pulse Readings from Last 1 Encounters:  02/11/23 68

## 2023-04-23 ENCOUNTER — Telehealth: Payer: Self-pay

## 2023-04-23 NOTE — Telephone Encounter (Signed)
1033 am.  Phone call made to daughter Aggie Cosier to check on patient status and schedule next home visit.  No answer.  Message left on VM.

## 2023-04-23 NOTE — Telephone Encounter (Signed)
12 pm.  Incoming call from spouse.  Visit scheduled for 7/12 @ 1 pm.

## 2023-04-24 ENCOUNTER — Other Ambulatory Visit: Payer: Self-pay | Admitting: Physician Assistant

## 2023-04-24 DIAGNOSIS — F0280 Dementia in other diseases classified elsewhere without behavioral disturbance: Secondary | ICD-10-CM

## 2023-04-26 DIAGNOSIS — Z7401 Bed confinement status: Secondary | ICD-10-CM | POA: Diagnosis not present

## 2023-04-26 DIAGNOSIS — R5381 Other malaise: Secondary | ICD-10-CM | POA: Diagnosis not present

## 2023-04-26 DIAGNOSIS — I10 Essential (primary) hypertension: Secondary | ICD-10-CM | POA: Diagnosis not present

## 2023-04-26 DIAGNOSIS — F0390 Unspecified dementia without behavioral disturbance: Secondary | ICD-10-CM | POA: Diagnosis not present

## 2023-04-26 DIAGNOSIS — L89150 Pressure ulcer of sacral region, unstageable: Secondary | ICD-10-CM | POA: Diagnosis not present

## 2023-04-26 NOTE — Telephone Encounter (Signed)
Requested medication (s) are due for refill today: Yes  Requested medication (s) are on the active medication list: Yes  Last refill:  11/19/22  Future visit scheduled: No  Notes to clinic:  Not delegated.    Requested Prescriptions  Pending Prescriptions Disp Refills   QUEtiapine (SEROQUEL) 25 MG tablet [Pharmacy Med Name: QUEtiapine Fumarate 25 MG Oral Tablet] 45 tablet 0    Sig: Take 1 tablet by mouth three times daily as needed     Not Delegated - Psychiatry:  Antipsychotics - Second Generation (Atypical) - quetiapine Failed - 04/24/2023  9:03 AM      Failed - This refill cannot be delegated      Failed - TSH in normal range and within 360 days    TSH  Date Value Ref Range Status  01/31/2021 1.170 0.450 - 4.500 uIU/mL Final         Failed - Valid encounter within last 6 months    Recent Outpatient Visits           1 year ago Controlled diabetes mellitus with nephropathy (HCC)   [place] [facility] Malva Limes, MD   2 years ago Frequent falls   [facility] Malva Limes, MD   2 years ago Controlled diabetes mellitus with nephropathy [facility])   [place] [facility] Malva Limes, MD   3 years ago Other fatigue    [facility] Malva Limes, MD   3 years ago Hemorrhoids, unspecified hemorrhoid type   [facility] [place], [place] M, PA-C              Failed - Lipid Panel in normal range within the last 12 months    Cholesterol, Total  Date Value Ref Range Status  04/26/2020 173 100 - 199 mg/dL Final  29/56/2130 865  Final   LDL Chol Calc (NIH)  Date Value Ref Range Status  04/26/2020 104 (H) 0 - 99 mg/dL Final   HDL  Date Value Ref Range Status  04/26/2020 45 >39 mg/dL Final   Triglycerides  Date Value Ref Range Status  04/26/2020 137 0 - 149 mg/dL Final  78/46/9629 528  Final         Failed - CBC within normal limits and  completed in the last 12 months    WBC  Date Value Ref Range Status  11/02/2021 8.6 4.0 - 10.5 K/uL Final   RBC  Date Value Ref Range Status  11/02/2021 3.64 (L) 3.87 - 5.11 MIL/uL Final   Hemoglobin  Date Value Ref Range Status  11/02/2021 11.3 (L) 12.0 - 15.0 g/dL Final  41/32/4401 02.7 11.1 - 15.9 g/dL Final   HCT  Date Value Ref Range Status  11/02/2021 35.1 (L) 36.0 - 46.0 % Final   Hematocrit  Date Value Ref Range Status  01/31/2021 33.1 (L) 34.0 - 46.6 % Final   MCHC  Date Value Ref Range Status  11/02/2021 32.2 30.0 - 36.0 g/dL Final   [facility]  Date Value Ref Range Status  11/02/2021 31.0 26.0 - 34.0 pg Final   MCV  Date Value Ref Range Status  11/02/2021 96.4 80.0 - 100.0 fL Final  01/31/2021 93 79 - 97 fL Final  01/25/2014 98 80 - 100 fL Final   No results found for: "PLTCOUNTKUC", "LABPLAT", "POCPLA" RDW  Date Value Ref Range Status  11/02/2021 14.8 11.5 - 15.5 % Final  01/31/2021 12.6 11.7 -  15.4 % Final  01/25/2014 14.1 11.5 - 14.5 % Final         Failed - CMP within normal limits and completed in the last 12 months    Albumin  Date Value Ref Range Status  11/02/2021 3.7 3.5 - 5.0 g/dL Final  16/08/9603 4.3 3.6 - 4.6 g/dL Final  54/07/8118 3.6 3.4 - 5.0 g/dL Final   Alkaline Phosphatase  Date Value Ref Range Status  11/02/2021 60 38 - 126 U/L Final  01/25/2014 60 Unit/L Final    Comment:    45-117 NOTE: New Reference Range 09/22/13    Alkaline phosphatase (APISO)  Date Value Ref Range Status  10/18/2017 55 33 - 130 U/L Final   ALT  Date Value Ref Range Status  11/02/2021 24 0 - 44 U/L Final   SGPT (ALT)  Date Value Ref Range Status  01/25/2014 26 12 - 78 U/L Final   AST  Date Value Ref Range Status  11/02/2021 25 15 - 41 U/L Final   SGOT(AST)  Date Value Ref Range Status  01/25/2014 22 15 - 37 Unit/L Final   BUN  Date Value Ref Range Status  11/02/2021 14 8 - 23 mg/dL Final  14/78/2956 23 8 - 27 mg/dL Final  21/30/8657 20   Final   Calcium  Date Value Ref Range Status  11/02/2021 11.2 (H) 8.9 - 10.3 mg/dL Final   Calcium, Total  Date Value Ref Range Status  01/25/2014 10.0 8.5 - 10.1 mg/dL Final   Calcium, Ion  Date Value Ref Range Status  07/03/2015 1.27 1.13 - 1.30 mmol/L Final   CO2  Date Value Ref Range Status  11/02/2021 26 22 - 32 mmol/L Final   Co2  Date Value Ref Range Status  01/25/2014 24 21 - 32 mmol/L Final   TCO2  Date Value Ref Range Status  07/03/2015 19 0 - 100 mmol/L Final   Creat  Date Value Ref Range Status  10/18/2017 1.37 (H) 0.60 - 0.88 mg/dL Final    Comment:    For patients >33 years of age, the reference limit for Creatinine is approximately 13% higher for people identified as African-American. .    Creatinine, Ser  Date Value Ref Range Status  11/02/2021 1.19 (H) 0.44 - 1.00 mg/dL Final   Glucose  Date Value Ref Range Status  03/14/2015 155 mg/dL Final  84/69/6295 284 (H) 65 - 99 mg/dL Final   Glucose, Bld  Date Value Ref Range Status  11/02/2021 198 (H) 70 - 99 mg/dL Final    Comment:    Glucose reference range applies only to samples taken after fasting for at least 8 hours.   Glucose-Capillary  Date Value Ref Range Status  07/05/2015 138 (H) 65 - 99 mg/dL Final   Potassium  Date Value Ref Range Status  11/02/2021 4.0 3.5 - 5.1 mmol/L Final  09/14/2014 4.4 mmol/L Final   Sodium  Date Value Ref Range Status  11/02/2021 135 135 - 145 mmol/L Final  01/31/2021 137 134 - 144 mmol/L Final  09/14/2014 139  Final   Total Bilirubin  Date Value Ref Range Status  11/02/2021 0.6 0.3 - 1.2 mg/dL Final   Bilirubin,Total  Date Value Ref Range Status  01/25/2014 0.3 0.2 - 1.0 mg/dL Final   Bilirubin Total  Date Value Ref Range Status  01/31/2021 <0.2 0.0 - 1.2 mg/dL Final   Bilirubin, Direct  Date Value Ref Range Status  02/20/2016 0.07 0.00 - 0.40 mg/dL Final  Protein, ur  Date Value Ref Range Status  04/16/2021 30 (A) NEGATIVE mg/dL Final    Total Protein  Date Value Ref Range Status  11/02/2021 7.8 6.5 - 8.1 g/dL Final  19/14/7829 7.8 6.0 - 8.5 g/dL Final  56/21/3086 8.3 (H) 6.4 - 8.2 g/dL Final   GFR, Est African American  Date Value Ref Range Status  10/18/2017 42 (L) > OR = 60 mL/min/1.58m2 Final   GFR calc Af Amer  Date Value Ref Range Status  04/26/2020 39 (L) >59 mL/min/1.73 Final    Comment:    **[facility] currently reports eGFR in compliance with the current**   recommendations of the [employer]. [facility] will   update reporting as new guidelines are published from the NKF-ASN   Task force.    eGFR  Date Value Ref Range Status  01/31/2021 38 (L) >59 mL/min/1.73 Final   GFR, Est Non African American  Date Value Ref Range Status  10/18/2017 36 (L) > OR = 60 mL/min/1.33m2 Final   GFR, Estimated  Date Value Ref Range Status  11/02/2021 45 (L) >60 mL/min Final    Comment:    (NOTE) Calculated using the CKD-EPI Creatinine Equation (2021)          Passed - Last BP in normal range    BP Readings from Last 1 Encounters:  02/11/23 130/72         Passed - Last Heart Rate in normal range    Pulse Readings from Last 1 Encounters:  02/11/23 68

## 2023-04-30 DIAGNOSIS — E785 Hyperlipidemia, unspecified: Secondary | ICD-10-CM | POA: Diagnosis not present

## 2023-04-30 DIAGNOSIS — E119 Type 2 diabetes mellitus without complications: Secondary | ICD-10-CM | POA: Diagnosis not present

## 2023-04-30 DIAGNOSIS — I10 Essential (primary) hypertension: Secondary | ICD-10-CM | POA: Diagnosis not present

## 2023-04-30 DIAGNOSIS — R54 Age-related physical debility: Secondary | ICD-10-CM | POA: Diagnosis not present

## 2023-04-30 DIAGNOSIS — F0390 Unspecified dementia without behavioral disturbance: Secondary | ICD-10-CM | POA: Diagnosis not present

## 2023-06-12 ENCOUNTER — Other Ambulatory Visit: Payer: Self-pay | Admitting: Family Medicine

## 2023-06-15 NOTE — Telephone Encounter (Signed)
OV needed for additional refills.  Requested Prescriptions  Pending Prescriptions Disp Refills   traZODone (DESYREL) 50 MG tablet [Pharmacy Med Name: traZODone HCl 50 MG Oral Tablet] 180 tablet 0    Sig: TAKE 1 & 1/2 TO 2 (ONE & ONE-HALF TO TWO) TABLETS BY MOUTH AT BEDTIME     Psychiatry: Antidepressants - Serotonin Modulator Failed - 06/12/2023  5:25 PM      Failed - Valid encounter within last 6 months    Recent Outpatient Visits           1 year ago Controlled diabetes mellitus with nephropathy [facility])   [place] [facility] Malva Limes, MD   2 years ago Frequent falls   [facility] Malva Limes, MD   2 years ago Controlled diabetes mellitus with nephropathy [facility])   [place] [facility] Malva Limes, MD   3 years ago Other fatigue    [facility] Malva Limes, MD   3 years ago Hemorrhoids, unspecified hemorrhoid type   [facility] [place], Ricki Rodriguez [place], [place]

## 2023-06-21 ENCOUNTER — Other Ambulatory Visit: Payer: Self-pay | Admitting: Family Medicine

## 2023-06-21 DIAGNOSIS — F0280 Dementia in other diseases classified elsewhere without behavioral disturbance: Secondary | ICD-10-CM

## 2023-06-22 NOTE — Telephone Encounter (Signed)
Requested medication (s) are due for refill today - yes  Requested medication (s) are on the active medication list -yes  Future visit scheduled -no  Last refill: 06/03/22  Notes to clinic: Patient is bedridden and under Hospice care- unable to come to office for appointment- request sent for PCP review   Requested Prescriptions  Pending Prescriptions Disp Refills   levETIRAcetam (KEPPRA) 500 MG tablet [Pharmacy Med Name: levETIRAcetam 500 MG Oral Tablet] 60 tablet 0    Sig: Take 1 tablet by mouth twice daily     Neurology:  Anticonvulsants - levetiracetam Failed - 06/21/2023  8:49 AM      Failed - Cr in normal range and within 360 days    Creat  Date Value Ref Range Status  10/18/2017 1.37 (H) 0.60 - 0.88 mg/dL Final    Comment:    For patients >53 years of age, the reference limit for Creatinine is approximately 13% higher for people identified as African-American. .    Creatinine, Ser  Date Value Ref Range Status  11/02/2021 1.19 (H) 0.44 - 1.00 mg/dL Final         Failed - Completed PHQ-2 or PHQ-9 in the last 360 days      Failed - Valid encounter within last 12 months    Recent Outpatient Visits           1 year ago Controlled diabetes mellitus with nephropathy (HCC)   [place] [facility] Malva Limes, MD   2 years ago Frequent falls   [facility] Mal[facility] Limes, MD   2 years ago Controlled diabetes mellitus with nephropathy [facility])   [place] Malva Limes, MD   3 years ago Other fatigue   [place] [facility] Malva Limes, MD   3 years ago Hemorrhoids, unspecified hemorrhoid type   [facility] [place], [place]                 Requested Prescriptions  Pending Prescriptions Disp Refills   levETIRAcetam (KEPPRA) 500 MG tablet [Pharmacy Med Name: levETIRAcetam 500 MG Oral Tablet] 60 tablet 0    Sig: Take 1 tablet by  mouth twice daily     Neurology:  Anticonvulsants - levetiracetam Failed - 06/21/2023  8:49 AM      Failed - Cr in normal range and within 360 days    Creat  Date Value Ref Range Status  10/18/2017 1.37 (H) 0.60 - 0.88 mg/dL Final    Comment:    For patients >31 years of age, the reference limit for Creatinine is approximately 13% higher for people identified as African-American. .    Creatinine, Ser  Date Value Ref Range Status  11/02/2021 1.19 (H) 0.44 - 1.00 mg/dL Final         Failed - Completed PHQ-2 or PHQ-9 in the last 360 days      Failed - Valid encounter within last 12 months    Recent Outpatient Visits           1 year ago Controlled diabetes mellitus with nephropathy [facility])   [place] [facility] Malva Limes, MD   2 years ago Frequent falls   [facility] Malva Limes, MD   2 years ago Controlled diabetes mellitus with nephropathy [facility])    [facility] Mal[facility] Limes, MD   3 years ago Other fatigue  [facility] Malva Limes, MD   3 years ago Hemorrhoids, unspecified hemorrhoid type   [facility] [place], Ricki Rodriguez [place], [place]

## 2023-09-08 ENCOUNTER — Other Ambulatory Visit: Payer: Self-pay | Admitting: Family Medicine

## 2023-10-12 ENCOUNTER — Other Ambulatory Visit: Payer: Self-pay | Admitting: Family Medicine

## 2023-10-12 DIAGNOSIS — E1121 Type 2 diabetes mellitus with diabetic nephropathy: Secondary | ICD-10-CM

## 2023-10-13 NOTE — Telephone Encounter (Signed)
Requested medication (s) are due for refill today - yes  Requested medication (s) are on the active medication list -yes  Future visit scheduled -no  Last refill: 08/03/22 #90 [id number]  Notes to clinic: Palative care patient- no current visit, fails lab protocol- over 1 year-11/02/21  Requested Prescriptions  Pending Prescriptions Disp Refills   metFORMIN (GLUCOPHAGE) 500 MG tablet [Pharmacy Med Name: metFORMIN HCl 500 MG Oral Tablet] 90 tablet 0    Sig: Take 1 tablet by mouth in the evening     Endocrinology:  Diabetes - Biguanides Failed - 10/12/2023  6:50 AM      Failed - Cr in normal range and within 360 days    Creat  Date Value Ref Range Status  10/18/2017 1.37 (H) 0.60 - 0.88 mg/dL Final    Comment:    For patients >29 years of age, the reference limit for Creatinine is approximately 13% higher for people identified as African-American. .    Creatinine, Ser  Date Value Ref Range Status  11/02/2021 1.19 (H) 0.44 - 1.00 mg/dL Final         Failed - HBA1C is between 0 and 7.9 and within 180 days    Hemoglobin A1C  Date Value Ref Range Status  07/02/2021 6.8  Final         Failed - eGFR in normal range and within 360 days    GFR, Est African American  Date Value Ref Range Status  10/18/2017 42 (L) > OR = 60 mL/min/1.70m2 Final   GFR calc Af Amer  Date Value Ref Range Status  04/26/2020 39 (L) >59 mL/min/1.73 Final    Comment:    **[facility] currently reports eGFR in compliance with the current**   recommendations of the [employer]. [facility] will   update reporting as new guidelines are published from the NKF-ASN   Task force.    GFR, Est Non African American  Date Value Ref Range Status  10/18/2017 36 (L) > OR = 60 mL/min/1.41m2 Final   GFR, Estimated  Date Value Ref Range Status  11/02/2021 45 (L) >60 mL/min Final    Comment:    (NOTE) Calculated using the CKD-EPI Creatinine Equation (2021)    eGFR  Date Value Ref Range Status  01/31/2021  38 (L) >59 mL/min/1.73 Final         Failed - B12 Level in normal range and within 720 days    Vitamin B-12  Date Value Ref Range Status  07/04/2015 240 180 - 914 pg/mL Final    Comment:    (NOTE) This assay is not validated for testing neonatal or myeloproliferative syndrome specimens for Vitamin B12 levels.          Failed - Valid encounter within last 6 months    Recent Outpatient Visits           2 years ago Controlled diabetes mellitus with nephropathy [facility])   [place] [facility] Limes[facility]   2 years ago Frequent falls   [facility] Malva Limes, MD   2 years ago Controlled diabetes mellitus with nephropathy [facility])   [facility] Malva Limes, MD   3 years ago Other fatigue   [place] [facility] Malva Limes, MD   3 years ago Hemorrhoids, unspecified hemorrhoid type   [facility] [place], [place], [place]  Failed - CBC within normal limits and completed in the last 12 months    WBC  Date Value Ref Range Status  11/02/2021 8.6 4.0 - 10.5 K/uL Final   RBC  Date Value Ref Range Status  11/02/2021 3.64 (L) 3.87 - 5.11 MIL/uL Final   Hemoglobin  Date Value Ref Range Status  11/02/2021 11.3 (L) 12.0 - 15.0 g/dL Final  16/08/9603 54.0 11.1 - 15.9 g/dL Final   HCT  Date Value Ref Range Status  11/02/2021 35.1 (L) 36.0 - 46.0 % Final   Hematocrit  Date Value Ref Range Status  01/31/2021 33.1 (L) 34.0 - 46.6 % Final   MCHC  Date Value Ref Range Status  11/02/2021 32.2 30.0 - 36.0 g/dL Final   [facility]  Date Value Ref Range Status  11/02/2021 31.0 26.0 - 34.0 pg Final   MCV  Date Value Ref Range Status  11/02/2021 96.4 80.0 - 100.0 fL Final  01/31/2021 93 79 - 97 fL Final  01/25/2014 98 80 - 100 fL Final   No results found for: "PLTCOUNTKUC", "LABPLAT", "POCPLA" RDW  Date Value Ref Range Status  11/02/2021  14.8 11.5 - 15.5 % Final  01/31/2021 12.6 11.7 - 15.4 % Final  01/25/2014 14.1 11.5 - 14.5 % Final            Requested Prescriptions  Pending Prescriptions Disp Refills   metFORMIN (GLUCOPHAGE) 500 MG tablet [Pharmacy Med Name: metFORMIN HCl 500 MG Oral Tablet] 90 tablet 0    Sig: Take 1 tablet by mouth in the evening     Endocrinology:  Diabetes - Biguanides Failed - 10/12/2023  6:50 AM      Failed - Cr in normal range and within 360 days    Creat  Date Value Ref Range Status  10/18/2017 1.37 (H) 0.60 - 0.88 mg/dL Final    Comment:    For patients >67 years of age, the reference limit for Creatinine is approximately 13% higher for people identified as African-American. .    Creatinine, Ser  Date Value Ref Range Status  11/02/2021 1.19 (H) 0.44 - 1.00 mg/dL Final         Failed - HBA1C is between 0 and 7.9 and within 180 days    Hemoglobin A1C  Date Value Ref Range Status  07/02/2021 6.8  Final         Failed - eGFR in normal range and within 360 days    GFR, Est African American  Date Value Ref Range Status  10/18/2017 42 (L) > OR = 60 mL/min/1.51m2 Final   GFR calc Af Amer  Date Value Ref Range Status  04/26/2020 39 (L) >59 mL/min/1.73 Final    Comment:    **[facility] currently reports eGFR in compliance with the current**   recommendations of the [employer]. [facility] will   update reporting as new guidelines are published from the NKF-ASN   Task force.    GFR, Est Non African American  Date Value Ref Range Status  10/18/2017 36 (L) > OR = 60 mL/min/1.27m2 Final   GFR, Estimated  Date Value Ref Range Status  11/02/2021 45 (L) >60 mL/min Final    Comment:    (NOTE) Calculated using the CKD-EPI Creatinine Equation (2021)    eGFR  Date Value Ref Range Status  01/31/2021 38 (L) >59 mL/min/1.73 Final         Failed - B12 Level in normal range and within 720 days    Vitamin B-12  Date Value Ref Range Status  07/04/2015 240 180 - 914  pg/mL Final    Comment:    (NOTE) This assay is not validated for testing neonatal or myeloproliferative syndrome specimens for Vitamin B12 levels.          Failed - Valid encounter within last 6 months    Recent Outpatient Visits           2 years ago Controlled diabetes mellitus with nephropathy (HCC)   [place] [facility] Malva Limes, MD   2 years ago Frequent falls   [facility] Malva Limes, MD   2 years ago Controlled diabetes mellitus with nephropathy [facility])   [place] [facility] Malva Limes, MD   3 years ago Other fatigue   [place] [facility] Malva Limes, MD   3 years ago Hemorrhoids, unspecified hemorrhoid type   [facility] [place], [place] M, PA-C              Failed - CBC within normal limits and completed in the last 12 months    WBC  Date Value Ref Range Status  11/02/2021 8.6 4.0 - 10.5 K/uL Final   RBC  Date Value Ref Range Status  11/02/2021 3.64 (L) 3.87 - 5.11 MIL/uL Final   Hemoglobin  Date Value Ref Range Status  11/02/2021 11.3 (L) 12.0 - 15.0 g/dL Final  40/98/1191 47.8 11.1 - 15.9 g/dL Final   HCT  Date Value Ref Range Status  11/02/2021 35.1 (L) 36.0 - 46.0 % Final   Hematocrit  Date Value Ref Range Status  01/31/2021 33.1 (L) 34.0 - 46.6 % Final   MCHC  Date Value Ref Range Status  11/02/2021 32.2 30.0 - 36.0 g/dL Final   [facility]  Date Value Ref Range Status  11/02/2021 31.0 26.0 - 34.0 pg Final   MCV  Date Value Ref Range Status  11/02/2021 96.4 80.0 - 100.0 fL Final  01/31/2021 93 79 - 97 fL Final  01/25/2014 98 80 - 100 fL Final   No results found for: "PLTCOUNTKUC", "LABPLAT", "POCPLA" RDW  Date Value Ref Range Status  11/02/2021 14.8 11.5 - 15.5 % Final  01/31/2021 12.6 11.7 - 15.4 % Final  01/25/2014 14.1 11.5 - 14.5 % Final

## 2023-11-03 ENCOUNTER — Other Ambulatory Visit: Payer: Self-pay | Admitting: Family Medicine

## 2023-11-03 DIAGNOSIS — F0280 Dementia in other diseases classified elsewhere without behavioral disturbance: Secondary | ICD-10-CM

## 2023-12-03 ENCOUNTER — Other Ambulatory Visit: Payer: Self-pay | Admitting: Family Medicine

## 2023-12-03 DIAGNOSIS — F0280 Dementia in other diseases classified elsewhere without behavioral disturbance: Secondary | ICD-10-CM

## 2024-03-02 DEATH — deceased
